# Patient Record
Sex: Female | Born: 1940 | Race: Black or African American | Hispanic: No | Marital: Married | Smoking: Former smoker
Health system: Southern US, Community
[De-identification: ages and names within clinical notes are randomized; demographics above are authoritative.]

## PROBLEM LIST (undated history)

## (undated) DIAGNOSIS — I2510 Atherosclerotic heart disease of native coronary artery without angina pectoris: Secondary | ICD-10-CM

## (undated) DIAGNOSIS — Z952 Presence of prosthetic heart valve: Secondary | ICD-10-CM

## (undated) DIAGNOSIS — K219 Gastro-esophageal reflux disease without esophagitis: Secondary | ICD-10-CM

## (undated) DIAGNOSIS — E785 Hyperlipidemia, unspecified: Secondary | ICD-10-CM

## (undated) DIAGNOSIS — F329 Major depressive disorder, single episode, unspecified: Secondary | ICD-10-CM

## (undated) DIAGNOSIS — F410 Panic disorder [episodic paroxysmal anxiety] without agoraphobia: Secondary | ICD-10-CM

## (undated) DIAGNOSIS — I10 Essential (primary) hypertension: Secondary | ICD-10-CM

## (undated) DIAGNOSIS — Z951 Presence of aortocoronary bypass graft: Secondary | ICD-10-CM

## (undated) DIAGNOSIS — M316 Other giant cell arteritis: Secondary | ICD-10-CM

## (undated) DIAGNOSIS — F32A Depression, unspecified: Secondary | ICD-10-CM

## (undated) DIAGNOSIS — Z7901 Long term (current) use of anticoagulants: Secondary | ICD-10-CM

## (undated) HISTORY — PX: AORTIC VALVE REPLACEMENT: SHX41

## (undated) HISTORY — DX: Atherosclerotic heart disease of native coronary artery without angina pectoris: I25.10

## (undated) HISTORY — DX: Depression, unspecified: F32.A

## (undated) HISTORY — DX: Other giant cell arteritis: M31.6

## (undated) HISTORY — PX: CARDIAC SURGERY: SHX584

## (undated) HISTORY — DX: Essential (primary) hypertension: I10

## (undated) HISTORY — DX: Presence of prosthetic heart valve: Z95.2

## (undated) HISTORY — DX: Panic disorder (episodic paroxysmal anxiety): F41.0

## (undated) HISTORY — DX: Gastro-esophageal reflux disease without esophagitis: K21.9

## (undated) HISTORY — DX: Presence of aortocoronary bypass graft: Z95.1

## (undated) HISTORY — PX: CHOLECYSTECTOMY: SHX55

## (undated) HISTORY — DX: Major depressive disorder, single episode, unspecified: F32.9

## (undated) HISTORY — DX: Hyperlipidemia, unspecified: E78.5

## (undated) HISTORY — DX: Long term (current) use of anticoagulants: Z79.01

## (undated) HISTORY — PX: CORONARY ARTERY BYPASS GRAFT: SHX141

---

## 1989-03-18 DIAGNOSIS — Z951 Presence of aortocoronary bypass graft: Secondary | ICD-10-CM

## 1989-03-18 HISTORY — DX: Presence of aortocoronary bypass graft: Z95.1

## 1997-06-16 ENCOUNTER — Encounter (HOSPITAL_COMMUNITY): Admission: RE | Admit: 1997-06-16 | Discharge: 1997-09-14 | Payer: Self-pay | Admitting: Cardiology

## 1997-08-12 ENCOUNTER — Ambulatory Visit (HOSPITAL_COMMUNITY): Admission: RE | Admit: 1997-08-12 | Discharge: 1997-08-12 | Payer: Self-pay | Admitting: Family Medicine

## 1998-04-10 ENCOUNTER — Encounter: Payer: Self-pay | Admitting: Cardiology

## 1998-04-10 ENCOUNTER — Ambulatory Visit (HOSPITAL_COMMUNITY): Admission: RE | Admit: 1998-04-10 | Discharge: 1998-04-10 | Payer: Self-pay | Admitting: Cardiology

## 1998-08-04 ENCOUNTER — Other Ambulatory Visit: Admission: RE | Admit: 1998-08-04 | Discharge: 1998-08-04 | Payer: Self-pay | Admitting: Obstetrics and Gynecology

## 1998-11-03 ENCOUNTER — Encounter: Payer: Self-pay | Admitting: General Surgery

## 1998-11-07 ENCOUNTER — Ambulatory Visit (HOSPITAL_COMMUNITY): Admission: RE | Admit: 1998-11-07 | Discharge: 1998-11-08 | Payer: Self-pay | Admitting: General Surgery

## 1999-11-08 ENCOUNTER — Other Ambulatory Visit: Admission: RE | Admit: 1999-11-08 | Discharge: 1999-11-08 | Payer: Self-pay | Admitting: Family Medicine

## 2000-05-10 ENCOUNTER — Emergency Department (HOSPITAL_COMMUNITY): Admission: EM | Admit: 2000-05-10 | Discharge: 2000-05-10 | Payer: Self-pay | Admitting: *Deleted

## 2000-05-10 ENCOUNTER — Encounter: Payer: Self-pay | Admitting: Emergency Medicine

## 2000-05-31 ENCOUNTER — Emergency Department (HOSPITAL_COMMUNITY): Admission: EM | Admit: 2000-05-31 | Discharge: 2000-05-31 | Payer: Self-pay | Admitting: Emergency Medicine

## 2000-05-31 ENCOUNTER — Encounter: Payer: Self-pay | Admitting: Emergency Medicine

## 2000-08-19 ENCOUNTER — Emergency Department (HOSPITAL_COMMUNITY): Admission: EM | Admit: 2000-08-19 | Discharge: 2000-08-19 | Payer: Self-pay | Admitting: *Deleted

## 2000-08-20 ENCOUNTER — Encounter: Payer: Self-pay | Admitting: *Deleted

## 2000-10-10 ENCOUNTER — Encounter: Payer: Self-pay | Admitting: Cardiology

## 2000-10-10 ENCOUNTER — Inpatient Hospital Stay (HOSPITAL_COMMUNITY): Admission: AD | Admit: 2000-10-10 | Discharge: 2000-10-18 | Payer: Self-pay | Admitting: Cardiology

## 2000-10-14 ENCOUNTER — Encounter: Payer: Self-pay | Admitting: Cardiology

## 2000-11-10 ENCOUNTER — Other Ambulatory Visit: Admission: RE | Admit: 2000-11-10 | Discharge: 2000-11-10 | Payer: Self-pay | Admitting: Family Medicine

## 2000-11-14 ENCOUNTER — Ambulatory Visit (HOSPITAL_COMMUNITY): Admission: RE | Admit: 2000-11-14 | Discharge: 2000-11-14 | Payer: Self-pay | Admitting: Gastroenterology

## 2001-02-02 ENCOUNTER — Encounter (HOSPITAL_COMMUNITY): Admission: RE | Admit: 2001-02-02 | Discharge: 2001-05-03 | Payer: Self-pay | Admitting: Cardiology

## 2001-05-16 ENCOUNTER — Encounter (HOSPITAL_COMMUNITY): Admission: RE | Admit: 2001-05-16 | Discharge: 2001-08-14 | Payer: Self-pay | Admitting: Cardiology

## 2001-08-16 ENCOUNTER — Encounter (HOSPITAL_COMMUNITY): Admission: RE | Admit: 2001-08-16 | Discharge: 2001-11-14 | Payer: Self-pay | Admitting: Cardiology

## 2001-11-12 ENCOUNTER — Other Ambulatory Visit: Admission: RE | Admit: 2001-11-12 | Discharge: 2001-11-12 | Payer: Self-pay | Admitting: Family Medicine

## 2001-11-16 ENCOUNTER — Encounter (HOSPITAL_COMMUNITY): Admission: RE | Admit: 2001-11-16 | Discharge: 2002-02-14 | Payer: Self-pay | Admitting: Cardiology

## 2002-02-15 ENCOUNTER — Encounter (HOSPITAL_COMMUNITY): Admission: RE | Admit: 2002-02-15 | Discharge: 2002-05-16 | Payer: Self-pay | Admitting: Cardiology

## 2002-05-05 ENCOUNTER — Ambulatory Visit (HOSPITAL_BASED_OUTPATIENT_CLINIC_OR_DEPARTMENT_OTHER): Admission: RE | Admit: 2002-05-05 | Discharge: 2002-05-05 | Payer: Self-pay | Admitting: Family Medicine

## 2002-05-17 ENCOUNTER — Encounter (HOSPITAL_COMMUNITY): Admission: RE | Admit: 2002-05-17 | Discharge: 2002-08-15 | Payer: Self-pay | Admitting: Cardiology

## 2002-08-17 ENCOUNTER — Encounter (HOSPITAL_COMMUNITY): Admission: RE | Admit: 2002-08-17 | Discharge: 2002-11-15 | Payer: Self-pay | Admitting: Cardiology

## 2002-11-17 ENCOUNTER — Encounter (HOSPITAL_COMMUNITY): Admission: RE | Admit: 2002-11-17 | Discharge: 2003-02-15 | Payer: Self-pay | Admitting: Cardiology

## 2003-02-16 ENCOUNTER — Encounter (HOSPITAL_COMMUNITY): Admission: RE | Admit: 2003-02-16 | Discharge: 2003-05-17 | Payer: Self-pay | Admitting: Cardiology

## 2003-02-23 ENCOUNTER — Other Ambulatory Visit: Admission: RE | Admit: 2003-02-23 | Discharge: 2003-02-23 | Payer: Self-pay | Admitting: Family Medicine

## 2004-05-16 ENCOUNTER — Other Ambulatory Visit: Admission: RE | Admit: 2004-05-16 | Discharge: 2004-05-16 | Payer: Self-pay | Admitting: Family Medicine

## 2005-05-21 ENCOUNTER — Other Ambulatory Visit: Admission: RE | Admit: 2005-05-21 | Discharge: 2005-05-21 | Payer: Self-pay | Admitting: Family Medicine

## 2005-06-25 ENCOUNTER — Encounter (HOSPITAL_COMMUNITY): Admission: RE | Admit: 2005-06-25 | Discharge: 2005-09-23 | Payer: Self-pay | Admitting: Cardiology

## 2005-10-17 ENCOUNTER — Encounter (HOSPITAL_COMMUNITY): Admission: RE | Admit: 2005-10-17 | Discharge: 2006-01-15 | Payer: Self-pay | Admitting: Cardiology

## 2006-01-16 ENCOUNTER — Encounter (HOSPITAL_COMMUNITY): Admission: RE | Admit: 2006-01-16 | Discharge: 2006-04-16 | Payer: Self-pay | Admitting: Cardiology

## 2006-04-18 ENCOUNTER — Encounter (HOSPITAL_COMMUNITY): Admission: RE | Admit: 2006-04-18 | Discharge: 2006-07-17 | Payer: Self-pay | Admitting: Cardiology

## 2006-05-15 ENCOUNTER — Emergency Department (HOSPITAL_COMMUNITY): Admission: EM | Admit: 2006-05-15 | Discharge: 2006-05-15 | Payer: Self-pay | Admitting: Emergency Medicine

## 2006-07-18 ENCOUNTER — Encounter (HOSPITAL_COMMUNITY): Admission: RE | Admit: 2006-07-18 | Discharge: 2006-10-16 | Payer: Self-pay | Admitting: Cardiology

## 2006-08-05 ENCOUNTER — Other Ambulatory Visit: Admission: RE | Admit: 2006-08-05 | Discharge: 2006-08-05 | Payer: Self-pay | Admitting: Family Medicine

## 2006-10-17 ENCOUNTER — Encounter (HOSPITAL_COMMUNITY): Admission: RE | Admit: 2006-10-17 | Discharge: 2007-01-15 | Payer: Self-pay | Admitting: Cardiology

## 2007-01-17 ENCOUNTER — Encounter (HOSPITAL_COMMUNITY): Admission: RE | Admit: 2007-01-17 | Discharge: 2007-03-17 | Payer: Self-pay | Admitting: Cardiology

## 2007-03-19 ENCOUNTER — Encounter (HOSPITAL_COMMUNITY): Admission: RE | Admit: 2007-03-19 | Discharge: 2007-04-17 | Payer: Self-pay | Admitting: Cardiology

## 2007-04-19 ENCOUNTER — Encounter (HOSPITAL_COMMUNITY): Admission: RE | Admit: 2007-04-19 | Discharge: 2007-07-18 | Payer: Self-pay | Admitting: Cardiology

## 2007-07-19 ENCOUNTER — Encounter (HOSPITAL_COMMUNITY): Admission: RE | Admit: 2007-07-19 | Discharge: 2007-10-17 | Payer: Self-pay | Admitting: Cardiology

## 2007-08-25 ENCOUNTER — Other Ambulatory Visit: Admission: RE | Admit: 2007-08-25 | Discharge: 2007-08-25 | Payer: Self-pay | Admitting: Family Medicine

## 2007-10-18 ENCOUNTER — Encounter (HOSPITAL_COMMUNITY): Admission: RE | Admit: 2007-10-18 | Discharge: 2007-12-10 | Payer: Self-pay | Admitting: Cardiology

## 2008-11-01 ENCOUNTER — Encounter (HOSPITAL_COMMUNITY): Admission: RE | Admit: 2008-11-01 | Discharge: 2009-01-30 | Payer: Self-pay | Admitting: Cardiology

## 2008-11-09 ENCOUNTER — Other Ambulatory Visit: Admission: RE | Admit: 2008-11-09 | Discharge: 2008-11-09 | Payer: Self-pay | Admitting: Family Medicine

## 2009-02-15 ENCOUNTER — Encounter (HOSPITAL_COMMUNITY): Admission: RE | Admit: 2009-02-15 | Discharge: 2009-05-16 | Payer: Self-pay | Admitting: Cardiology

## 2009-05-17 ENCOUNTER — Encounter (HOSPITAL_COMMUNITY): Admission: RE | Admit: 2009-05-17 | Discharge: 2009-08-15 | Payer: Self-pay | Admitting: Cardiology

## 2009-08-16 ENCOUNTER — Encounter (HOSPITAL_COMMUNITY): Admission: RE | Admit: 2009-08-16 | Discharge: 2009-11-14 | Payer: Self-pay | Admitting: Cardiology

## 2009-10-16 ENCOUNTER — Ambulatory Visit: Payer: Self-pay | Admitting: Cardiology

## 2009-11-16 ENCOUNTER — Ambulatory Visit: Payer: Self-pay | Admitting: Cardiology

## 2009-11-16 ENCOUNTER — Encounter (HOSPITAL_COMMUNITY)
Admission: RE | Admit: 2009-11-16 | Discharge: 2010-02-14 | Payer: Self-pay | Source: Home / Self Care | Admitting: Cardiology

## 2009-11-30 ENCOUNTER — Ambulatory Visit: Payer: Self-pay | Admitting: Cardiology

## 2009-12-28 ENCOUNTER — Ambulatory Visit: Payer: Self-pay | Admitting: Cardiology

## 2010-01-29 ENCOUNTER — Ambulatory Visit: Payer: Self-pay | Admitting: Cardiology

## 2010-02-12 ENCOUNTER — Ambulatory Visit: Payer: Self-pay | Admitting: Cardiology

## 2010-02-15 ENCOUNTER — Encounter (HOSPITAL_COMMUNITY)
Admission: RE | Admit: 2010-02-15 | Discharge: 2010-04-17 | Payer: Self-pay | Source: Home / Self Care | Attending: Cardiology | Admitting: Cardiology

## 2010-02-20 ENCOUNTER — Ambulatory Visit: Payer: Self-pay | Admitting: Cardiovascular Disease

## 2010-03-16 ENCOUNTER — Ambulatory Visit: Payer: Self-pay | Admitting: Cardiology

## 2010-04-17 ENCOUNTER — Ambulatory Visit: Payer: Self-pay | Admitting: Cardiology

## 2010-04-18 ENCOUNTER — Ambulatory Visit (HOSPITAL_COMMUNITY): Payer: Medicare Other

## 2010-04-18 DIAGNOSIS — I10 Essential (primary) hypertension: Secondary | ICD-10-CM | POA: Insufficient documentation

## 2010-04-18 DIAGNOSIS — I2510 Atherosclerotic heart disease of native coronary artery without angina pectoris: Secondary | ICD-10-CM | POA: Insufficient documentation

## 2010-04-18 DIAGNOSIS — Z5189 Encounter for other specified aftercare: Secondary | ICD-10-CM | POA: Insufficient documentation

## 2010-04-18 DIAGNOSIS — I359 Nonrheumatic aortic valve disorder, unspecified: Secondary | ICD-10-CM | POA: Insufficient documentation

## 2010-04-18 DIAGNOSIS — Z951 Presence of aortocoronary bypass graft: Secondary | ICD-10-CM | POA: Insufficient documentation

## 2010-04-19 ENCOUNTER — Ambulatory Visit (INDEPENDENT_AMBULATORY_CARE_PROVIDER_SITE_OTHER): Payer: Medicare Other | Admitting: Cardiology

## 2010-04-19 ENCOUNTER — Encounter (HOSPITAL_COMMUNITY): Payer: Self-pay

## 2010-04-19 DIAGNOSIS — Z7901 Long term (current) use of anticoagulants: Secondary | ICD-10-CM

## 2010-04-19 DIAGNOSIS — I2510 Atherosclerotic heart disease of native coronary artery without angina pectoris: Secondary | ICD-10-CM

## 2010-04-19 DIAGNOSIS — IMO0001 Reserved for inherently not codable concepts without codable children: Secondary | ICD-10-CM

## 2010-04-24 ENCOUNTER — Encounter: Payer: Self-pay | Admitting: *Deleted

## 2010-04-24 ENCOUNTER — Encounter (HOSPITAL_COMMUNITY): Payer: Self-pay

## 2010-04-24 ENCOUNTER — Ambulatory Visit (INDEPENDENT_AMBULATORY_CARE_PROVIDER_SITE_OTHER): Payer: Medicare Other | Admitting: *Deleted

## 2010-04-24 DIAGNOSIS — I10 Essential (primary) hypertension: Secondary | ICD-10-CM

## 2010-04-24 DIAGNOSIS — E785 Hyperlipidemia, unspecified: Secondary | ICD-10-CM

## 2010-04-24 DIAGNOSIS — F410 Panic disorder [episodic paroxysmal anxiety] without agoraphobia: Secondary | ICD-10-CM | POA: Insufficient documentation

## 2010-04-24 DIAGNOSIS — Z952 Presence of prosthetic heart valve: Secondary | ICD-10-CM

## 2010-04-24 DIAGNOSIS — Z951 Presence of aortocoronary bypass graft: Secondary | ICD-10-CM | POA: Insufficient documentation

## 2010-04-24 DIAGNOSIS — M316 Other giant cell arteritis: Secondary | ICD-10-CM | POA: Insufficient documentation

## 2010-04-24 DIAGNOSIS — R002 Palpitations: Secondary | ICD-10-CM

## 2010-04-24 DIAGNOSIS — Z7901 Long term (current) use of anticoagulants: Secondary | ICD-10-CM

## 2010-04-24 DIAGNOSIS — Z954 Presence of other heart-valve replacement: Secondary | ICD-10-CM

## 2010-04-24 NOTE — Patient Instructions (Signed)
We will check a 24 hour Holter and update her echo. Further disposition to follow. She will tentatively see Dr. [place] back at her scheduled time in 6 months, but sooner if the studies would dictate otherwise. She is agreeable to this plan and will call for any problems in the interim.

## 2010-04-24 NOTE — Assessment & Plan Note (Signed)
Blood pressure is satisfactory as an outpatient. We will continue to monitor.

## 2010-04-24 NOTE — Progress Notes (Signed)
Tamara Patterson is seen today for a work in visit. She was just here 5 days ago and had a good visit. This past Friday night she was out at dinner. She had 2 cups of coffee. She felt like it was caffeinated. She began having palpitations that have continued off and on since that time. She is not really dizzy but describes it as a nuisance. She feels fluttering in her chest with some skipping. The palpitations will make her cough at times.   She was previously doing well. She denies chest pain. Her blood pressure is checked regularly at rehab and is normal.   The 15 point review of systems is positive for palpitations, anxiety, back pain, joint pain and myalgias. She has seen her PCP last week and has had extensive blood work for evaluation of her myalgias.   I have reviewed the patient's medical history in detail and updated the computerized patient record.  Exam:  She is very pleasant and in no acute distress. She looks much younger than her stated age. Vital signs are noted. Skin is warm and dry. Color is normal. HEENT is unremarkable. Lungs are clear. Cardiac exam shows a regular rate and rhythm. Valve sound is crisp. She does have a rare ectopic. Abdomen is soft. Extremities are without edema. Gait and ROM are intact. She has no gross neurologic deficits.   EKG shows sinus rhythm with diffuse ST and T wave changes.

## 2010-04-25 ENCOUNTER — Encounter (HOSPITAL_COMMUNITY): Payer: Self-pay

## 2010-04-26 ENCOUNTER — Encounter (HOSPITAL_COMMUNITY): Payer: Self-pay

## 2010-05-01 ENCOUNTER — Encounter (INDEPENDENT_AMBULATORY_CARE_PROVIDER_SITE_OTHER): Payer: Medicare Other

## 2010-05-01 ENCOUNTER — Encounter (HOSPITAL_COMMUNITY): Payer: Self-pay

## 2010-05-01 DIAGNOSIS — I2510 Atherosclerotic heart disease of native coronary artery without angina pectoris: Secondary | ICD-10-CM

## 2010-05-01 DIAGNOSIS — Z7901 Long term (current) use of anticoagulants: Secondary | ICD-10-CM

## 2010-05-02 ENCOUNTER — Encounter (HOSPITAL_COMMUNITY): Payer: Self-pay

## 2010-05-03 ENCOUNTER — Encounter (HOSPITAL_COMMUNITY): Payer: Self-pay

## 2010-05-04 ENCOUNTER — Ambulatory Visit (HOSPITAL_COMMUNITY): Payer: Medicare Other | Attending: Cardiology

## 2010-05-04 ENCOUNTER — Encounter: Payer: Self-pay | Admitting: Cardiology

## 2010-05-04 DIAGNOSIS — I359 Nonrheumatic aortic valve disorder, unspecified: Secondary | ICD-10-CM | POA: Insufficient documentation

## 2010-05-04 DIAGNOSIS — E785 Hyperlipidemia, unspecified: Secondary | ICD-10-CM | POA: Insufficient documentation

## 2010-05-04 DIAGNOSIS — I059 Rheumatic mitral valve disease, unspecified: Secondary | ICD-10-CM | POA: Insufficient documentation

## 2010-05-04 DIAGNOSIS — R002 Palpitations: Secondary | ICD-10-CM

## 2010-05-04 DIAGNOSIS — I10 Essential (primary) hypertension: Secondary | ICD-10-CM | POA: Insufficient documentation

## 2010-05-04 DIAGNOSIS — I079 Rheumatic tricuspid valve disease, unspecified: Secondary | ICD-10-CM | POA: Insufficient documentation

## 2010-05-04 DIAGNOSIS — I379 Nonrheumatic pulmonary valve disorder, unspecified: Secondary | ICD-10-CM | POA: Insufficient documentation

## 2010-05-04 DIAGNOSIS — Z954 Presence of other heart-valve replacement: Secondary | ICD-10-CM | POA: Insufficient documentation

## 2010-05-08 ENCOUNTER — Encounter (HOSPITAL_COMMUNITY): Payer: Self-pay

## 2010-05-09 ENCOUNTER — Encounter (HOSPITAL_COMMUNITY): Payer: Self-pay

## 2010-05-10 ENCOUNTER — Encounter (HOSPITAL_COMMUNITY): Payer: Self-pay

## 2010-05-15 ENCOUNTER — Encounter (HOSPITAL_COMMUNITY): Payer: Self-pay

## 2010-05-16 ENCOUNTER — Encounter (HOSPITAL_COMMUNITY): Payer: Self-pay

## 2010-05-17 ENCOUNTER — Encounter (HOSPITAL_COMMUNITY): Payer: Self-pay

## 2010-05-22 ENCOUNTER — Encounter (HOSPITAL_COMMUNITY): Payer: Self-pay

## 2010-05-23 ENCOUNTER — Encounter (HOSPITAL_COMMUNITY): Payer: Self-pay

## 2010-05-24 ENCOUNTER — Encounter: Payer: Self-pay | Admitting: Cardiology

## 2010-05-24 ENCOUNTER — Encounter (HOSPITAL_COMMUNITY): Payer: Self-pay

## 2010-05-29 ENCOUNTER — Encounter (HOSPITAL_COMMUNITY): Payer: Self-pay | Attending: Cardiology

## 2010-05-29 ENCOUNTER — Encounter (INDEPENDENT_AMBULATORY_CARE_PROVIDER_SITE_OTHER): Payer: Medicare Other

## 2010-05-29 DIAGNOSIS — Z954 Presence of other heart-valve replacement: Secondary | ICD-10-CM

## 2010-05-29 DIAGNOSIS — Z7901 Long term (current) use of anticoagulants: Secondary | ICD-10-CM

## 2010-05-29 DIAGNOSIS — I2510 Atherosclerotic heart disease of native coronary artery without angina pectoris: Secondary | ICD-10-CM

## 2010-05-30 ENCOUNTER — Encounter (HOSPITAL_COMMUNITY): Payer: Self-pay

## 2010-05-30 DIAGNOSIS — I2510 Atherosclerotic heart disease of native coronary artery without angina pectoris: Secondary | ICD-10-CM | POA: Insufficient documentation

## 2010-05-30 DIAGNOSIS — Z5189 Encounter for other specified aftercare: Secondary | ICD-10-CM | POA: Insufficient documentation

## 2010-05-30 DIAGNOSIS — I359 Nonrheumatic aortic valve disorder, unspecified: Secondary | ICD-10-CM | POA: Insufficient documentation

## 2010-05-30 DIAGNOSIS — Z951 Presence of aortocoronary bypass graft: Secondary | ICD-10-CM | POA: Insufficient documentation

## 2010-05-30 DIAGNOSIS — I10 Essential (primary) hypertension: Secondary | ICD-10-CM | POA: Insufficient documentation

## 2010-05-31 ENCOUNTER — Encounter (HOSPITAL_COMMUNITY): Payer: Self-pay

## 2010-06-05 ENCOUNTER — Encounter (HOSPITAL_COMMUNITY): Payer: Self-pay

## 2010-06-06 ENCOUNTER — Encounter (HOSPITAL_COMMUNITY): Payer: Self-pay

## 2010-06-07 ENCOUNTER — Encounter (HOSPITAL_COMMUNITY): Payer: Self-pay

## 2010-06-12 ENCOUNTER — Encounter (HOSPITAL_COMMUNITY): Payer: Self-pay

## 2010-06-13 ENCOUNTER — Encounter (HOSPITAL_COMMUNITY): Payer: Self-pay

## 2010-06-14 ENCOUNTER — Encounter (HOSPITAL_COMMUNITY): Payer: Self-pay

## 2010-06-19 ENCOUNTER — Encounter (HOSPITAL_COMMUNITY): Payer: Self-pay | Attending: Cardiology

## 2010-06-19 DIAGNOSIS — I359 Nonrheumatic aortic valve disorder, unspecified: Secondary | ICD-10-CM | POA: Insufficient documentation

## 2010-06-19 DIAGNOSIS — Z5189 Encounter for other specified aftercare: Secondary | ICD-10-CM | POA: Insufficient documentation

## 2010-06-19 DIAGNOSIS — Z951 Presence of aortocoronary bypass graft: Secondary | ICD-10-CM | POA: Insufficient documentation

## 2010-06-19 DIAGNOSIS — I2510 Atherosclerotic heart disease of native coronary artery without angina pectoris: Secondary | ICD-10-CM | POA: Insufficient documentation

## 2010-06-19 DIAGNOSIS — I10 Essential (primary) hypertension: Secondary | ICD-10-CM | POA: Insufficient documentation

## 2010-06-20 ENCOUNTER — Encounter (HOSPITAL_COMMUNITY): Payer: Self-pay

## 2010-06-21 ENCOUNTER — Encounter (HOSPITAL_COMMUNITY): Payer: Self-pay

## 2010-06-26 ENCOUNTER — Encounter (HOSPITAL_COMMUNITY): Payer: Self-pay

## 2010-06-26 ENCOUNTER — Ambulatory Visit (INDEPENDENT_AMBULATORY_CARE_PROVIDER_SITE_OTHER): Payer: Medicare Other | Admitting: *Deleted

## 2010-06-26 DIAGNOSIS — Z954 Presence of other heart-valve replacement: Secondary | ICD-10-CM

## 2010-06-26 DIAGNOSIS — I359 Nonrheumatic aortic valve disorder, unspecified: Secondary | ICD-10-CM

## 2010-06-26 DIAGNOSIS — Z952 Presence of prosthetic heart valve: Secondary | ICD-10-CM

## 2010-06-26 DIAGNOSIS — Z7901 Long term (current) use of anticoagulants: Secondary | ICD-10-CM

## 2010-06-26 LAB — POCT INR: INR: 3.6

## 2010-06-27 ENCOUNTER — Encounter (HOSPITAL_COMMUNITY): Payer: Self-pay

## 2010-06-28 ENCOUNTER — Encounter (HOSPITAL_COMMUNITY): Payer: Self-pay

## 2010-07-03 ENCOUNTER — Encounter (HOSPITAL_COMMUNITY): Payer: Self-pay

## 2010-07-04 ENCOUNTER — Encounter (HOSPITAL_COMMUNITY): Payer: Self-pay

## 2010-07-05 ENCOUNTER — Encounter (HOSPITAL_COMMUNITY): Payer: Self-pay

## 2010-07-10 ENCOUNTER — Encounter (HOSPITAL_COMMUNITY): Payer: Self-pay

## 2010-07-11 ENCOUNTER — Encounter (HOSPITAL_COMMUNITY): Payer: Self-pay

## 2010-07-12 ENCOUNTER — Encounter (HOSPITAL_COMMUNITY): Payer: Self-pay

## 2010-07-17 ENCOUNTER — Ambulatory Visit (INDEPENDENT_AMBULATORY_CARE_PROVIDER_SITE_OTHER): Payer: Medicare Other | Admitting: *Deleted

## 2010-07-17 ENCOUNTER — Encounter (HOSPITAL_COMMUNITY): Payer: Self-pay | Attending: Cardiology

## 2010-07-17 DIAGNOSIS — I10 Essential (primary) hypertension: Secondary | ICD-10-CM | POA: Insufficient documentation

## 2010-07-17 DIAGNOSIS — I2510 Atherosclerotic heart disease of native coronary artery without angina pectoris: Secondary | ICD-10-CM | POA: Insufficient documentation

## 2010-07-17 DIAGNOSIS — Z951 Presence of aortocoronary bypass graft: Secondary | ICD-10-CM | POA: Insufficient documentation

## 2010-07-17 DIAGNOSIS — Z7901 Long term (current) use of anticoagulants: Secondary | ICD-10-CM

## 2010-07-17 DIAGNOSIS — Z952 Presence of prosthetic heart valve: Secondary | ICD-10-CM

## 2010-07-17 DIAGNOSIS — I359 Nonrheumatic aortic valve disorder, unspecified: Secondary | ICD-10-CM | POA: Insufficient documentation

## 2010-07-17 DIAGNOSIS — Z5189 Encounter for other specified aftercare: Secondary | ICD-10-CM | POA: Insufficient documentation

## 2010-07-17 DIAGNOSIS — Z954 Presence of other heart-valve replacement: Secondary | ICD-10-CM

## 2010-07-18 ENCOUNTER — Encounter (HOSPITAL_COMMUNITY): Payer: Self-pay

## 2010-07-19 ENCOUNTER — Encounter (HOSPITAL_COMMUNITY): Payer: Self-pay

## 2010-07-24 ENCOUNTER — Encounter (HOSPITAL_COMMUNITY): Payer: Self-pay

## 2010-07-24 ENCOUNTER — Encounter: Payer: Medicare Other | Admitting: *Deleted

## 2010-07-25 ENCOUNTER — Encounter (HOSPITAL_COMMUNITY): Payer: Self-pay

## 2010-07-26 ENCOUNTER — Encounter (HOSPITAL_COMMUNITY): Payer: Self-pay

## 2010-07-31 ENCOUNTER — Encounter (HOSPITAL_COMMUNITY): Payer: Self-pay

## 2010-08-01 ENCOUNTER — Encounter (HOSPITAL_COMMUNITY): Payer: Self-pay

## 2010-08-01 ENCOUNTER — Ambulatory Visit (INDEPENDENT_AMBULATORY_CARE_PROVIDER_SITE_OTHER): Payer: Medicare Other | Admitting: *Deleted

## 2010-08-01 DIAGNOSIS — E7800 Pure hypercholesterolemia, unspecified: Secondary | ICD-10-CM

## 2010-08-01 DIAGNOSIS — Z7901 Long term (current) use of anticoagulants: Secondary | ICD-10-CM

## 2010-08-01 DIAGNOSIS — Z952 Presence of prosthetic heart valve: Secondary | ICD-10-CM

## 2010-08-01 DIAGNOSIS — I359 Nonrheumatic aortic valve disorder, unspecified: Secondary | ICD-10-CM

## 2010-08-01 DIAGNOSIS — Z954 Presence of other heart-valve replacement: Secondary | ICD-10-CM

## 2010-08-01 LAB — LIPID PANEL
Cholesterol: 166 mg/dL (ref 0–200)
HDL: 49 mg/dL (ref >39.00)
Triglycerides: 75 mg/dL (ref 0.0–149.0)

## 2010-08-01 LAB — HEPATIC FUNCTION PANEL
ALT: 21 U/L (ref 0–35)
AST: 22 U/L (ref 0–37)
Albumin: 3.7 g/dL (ref 3.5–5.2)
Alkaline Phosphatase: 73 U/L (ref 39–117)
Bilirubin, Direct: 0.1 mg/dL (ref 0.0–0.3)
Total Bilirubin: 0.7 mg/dL (ref 0.3–1.2)
Total Protein: 6.2 g/dL (ref 6.0–8.3)

## 2010-08-01 LAB — BASIC METABOLIC PANEL
CO2: 31 mEq/L (ref 19–32)
Calcium: 9 mg/dL (ref 8.4–10.5)
Chloride: 107 mEq/L (ref 96–112)
Creatinine, Ser: 0.7 mg/dL (ref 0.4–1.2)
GFR: 104.24 mL/min (ref >60.00)

## 2010-08-02 ENCOUNTER — Encounter (HOSPITAL_COMMUNITY): Payer: Self-pay

## 2010-08-02 ENCOUNTER — Telehealth: Payer: Self-pay | Admitting: *Deleted

## 2010-08-02 NOTE — Telephone Encounter (Signed)
Notified of lab results. Sent to Dr. Wynelle Link

## 2010-08-02 NOTE — Telephone Encounter (Signed)
Message copied by Murrell Redden on Thu Aug 02, 2010 10:17 AM ------      Message from: [place], PETER      Created: Wed Aug 01, 2010  4:30 PM       Chemistries all normal. Lipids look fairly good. Please report.

## 2010-08-03 NOTE — Discharge Summary (Signed)
[place]. [facility]  Patient:    Tamara Patterson, Tamara Patterson                         MRN: [id number] Adm. Date:  [id number] Disc. Date: 10/14/00 Attending:  [place], Peter Manning CC:         Elana Alm. Eliezer Lofts., M.D.   Discharge Summary  HISTORY OF PRESENT ILLNESS:  Ms. Polyakov is a 70 year old white female with history of multiple medical problems.  She presented with intermittent chest pain and abnormal ECG.  The patient has a history of coronary artery disease and is status post coronary artery bypass graft surgery x 4 in 1991.  She had to have redo bypass surgery in 1998 when she presented with severe aortic insufficiency and aortic redilatation.  She had a conduit St. Jude #23 mm valve placed at that time.  She had a reimplantation of her saphenous vein graft to the right coronary artery.  She continued to have a LIMA graft to LAD and she had a saphenous vein graft placed in the circumflex.  The patient also has had recent difficulties with panic attacks.  She has been on chronic anticoagulation with Coumadin.  She has a history of gastroesophageal reflux disease and hypertension.  For details of her past medical history, social history, family history, and physical exam, please see admission history and physical.  LABORATORY DATA:  Sodium 141, potassium 4.4, chloride 107, CO2 31, glucose 118, BUN 12, creatinine 0.8.  All other chemistries were normal.  CK was 77 with negative MB.  Troponin 0.02.  TSH 1.11.  White count 4900, hemoglobin 12, hematocrit 34.7, platelet count 224,000.  PT was 23 with an INR of 2.6.  PTT 34.  HOSPITAL COURSE:  The patient was admitted and placed on IV nitroglycerin and IV heparin.  Her Coumadin was held.  She continued to have episodes of substernal discomfort with a great deal of anxiety.  She was put on around the clock Xanax.  Serial cardiac enzymes were all normal.  ECGs continued to show persistent T-wave inversion  anterolaterally that was new.  The patient had an echocardiogram performed which showed normal left ventricular size and function.  Her aortic valve prosthesis was functioning well with very mild aortic regurgitation.  Transaortic valve gradient was 11 mmHg.  There was mild mitral regurgitation.  The aortic root appeared normal in size.  On October 14, 2000, the patient underwent cardiac catheterization.  Left ventricular angiography was not performed due to her aortic valve prosthesis. The aortic root appeared normal in size with excellent appearance of the conduit.  Her valve leaflets seemed to open well.  She did have mild aortic insufficiency.  The native coronaries were occluded at the ostium.  The saphenous vein graft to the right coronary artery arose out of the conduit and was patent with good distal runoff.  The LIMA graft to the LAD was patent but had minimal flow to the LAD.  There was a very large saphenous vein graft to the first obtuse marginal vessel.  The proximal anastomosis was off of the proximal innominate artery.  This graft supplied not only the first marginal vessel but also filled retrograde the LAD and by segment of old vein graft the second marginal vessel.  Based on these findings, it was felt that she was adequately revascularized. It was felt that her symptoms were predominantly related to her panic disorder.  The patient was continued on  her same medical therapy and was discharged home in stable condition on the same day.  DISCHARGE DIAGNOSES: 1. Atypical chest pain. 2. Panic disorder. 3. Atherosclerotic coronary artery disease, status post coronary artery bypass    graft with continued graft patency. 4. 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 5. Gastroesophageal reflux disease. 6. Hypertension. 7. History of ACE mediated cough. 8. Arteritis.  DISCHARGE MEDICATIONS: 1. Multivitamin 1 q.d. 2. Coumadin 5 mg q.d. 3. Lopressor 50 mg  b.i.d. 4. Cozaar 50 mg q.d. 5. Claritin 10 mg q.d. 6. Macrodantin 100 mg q.d. 7. Paxil 10 mg q.d. 8. Xanax 0.25 mg q.4-6h. p.r.n. anxiety.  DISCHARGE INSTRUCTIONS:  The patient is instructed no heavy lifting or straining for five days.  She will be maintained on a low salt, low fat diet. She will follow up with Dr. Peter M. [place] in two weeks with a pro time.  CONDITION ON DISCHARGE:  Improved. DD:  10/14/00 TD:  10/14/00 Job: [id number]

## 2010-08-03 NOTE — Procedures (Signed)
[place]. [facility]  Patient:    Tamara Patterson, Tamara Patterson. Visit Number: [id number] MRN: [id number]          Service Type: Attending:  Anselmo Rod, M.D. Proc. Date: 11/14/00   CC:         Gabriel Earing, M.D.   Procedure Report  DATE OF BIRTH:  1940-08-11.  REFERRING PHYSICIAN:  Gabriel Earing, M.D.  PROCEDURE PERFORMED:  Colonoscopy.  ENDOSCOPIST:  Anselmo Rod, M.D.  INSTRUMENT USED:  Olympus video colonoscope.  INDICATIONS FOR PROCEDURE:  Family history of colon cancer in a 70 year old African-American female.  This patient has CABG in the past.  PREPROCEDURE PREPARATION:  Informed consent was procured from the patient. The patient was fasted for eight hours prior to the procedure and prepped with a bottle of magnesium citrate and a gallon of NuLytely the night prior to the procedure.  PREPROCEDURE PHYSICAL:  The patient had stable vital signs.  Neck supple. Chest clear to auscultation.  S1, S2 regular.  Well-healed surgical scar from previous CABG.  Abdomen soft with normal abdominal bowel sounds.  DESCRIPTION OF PROCEDURE:  The patient was placed in the left lateral decubitus position and sedated with 50 mg of Demerol and 5 mg of Versed intravenously.  Once the patient was adequately sedated and maintained on low-flow oxygen and continuous cardiac monitoring, the Olympus video colonoscope was advanced from the rectum to the cecum without difficulty. There was a small amount of residual stool in the colon that was suctioned. Some washes were done.  The entire colonic mucosa appeared healthy without erosions, ulcerations, masses or polyps.  There was no evidence of diverticular disease or hemorrhoids.  IMPRESSION: 1. Healthy-appearing colon up to the cecum. 2. No masses or polyps seen.      RECOMMENDATIONS:  Repeat colorectal cancer screening is recommended in the next five years unless the patient were to develop any abnormal  symptoms in the interim. Attending:  Anselmo Rod, M.D. DD:  11/14/00 TD:  11/14/00 Job: [id number]

## 2010-08-03 NOTE — Cardiovascular Report (Signed)
[place]. [facility]  Patient:    Tamara Patterson, Tamara Patterson                         MRN: [id number] Proc. Date: 10/14/00 Adm. Date:  [id number] Attending:  [place], Peter Manning CC:         Lurena Joiner, M.D.   Cardiac Catheterization  INDICATIONS FOR PROCEDURE:  The patient is a 70 year old, black female, status post coronary artery bypass surgery and aortic valve replacement who presents with chest pain and anterior T wave changes on her ECG.  ACCESS:  Via the right femoral artery using the standard Seldinger technique.  EQUIPMENT:  A 6 French 4 cm right Judkins catheter, 6 French pigtail catheter, 6 French right Amplatz I catheter.  HEMODYNAMIC DATA:  Aortic pressure is 127/60 with a mean of 86 mmHg.  COMPLICATIONS:  None.  CONTRAST:  Omnipaque 320 cc.  ANGIOGRAPHIC DATA:  Left ventricular angiography was not performed due to her prosthetic aortic valve.  Aortic root angiography demonstrates normal aortic root size.  The aortic valve prosthetic leaflets have excellent mobility.  There is mild aortic insufficiency by root injection.  The saphenous vein graft to the right coronary artery is seen to arise out of the segment of the aorta that involves the conduit graft.  The LIMA graft to the LAD is patent but there was minimal flow to the LAD proper distally.  The left subclavian vessel is without significant disease.  The saphenous vein graft in the proximal right coronary artery is widely patent.  The proximal anastomosis is in the proximal aorta in the area of the aortic conduit.  It was very difficult to locate the proximal anastomosis of the saphenous vein graft to the circumflex.  After multiple investigations it was noted that this graft arises from the innominate vessel.  This was cannulated with the right coronary catheter.  This is a very large vein graft that inserts into the first obtuse marginal vessel.  It fills this vessel as well as the LAD  by retrograde flow.  The segment of the native circumflex between the first and second obtuse marginal vessel is severely diseased up to 80-90%.  However, there is still a segment of vein graft from her original bypass surgery between the first and second marginal vessels which fills the second marginal branch.  FINAL INTERPRETATION: 1. Normal aortic root size. 2. Mild aortic insufficiency. 3. Patent saphenous vein graft to the right coronary artery. 4. Patent saphenous vein graft to the first obtuse marginal branch.    This vein graft supplies most of the left coronary circulation. 5. Patent left internal mammary artery to the left anterior descending, but    with minimal distal flow.  There is competitive filling from the vein    graft to the marginal branch. DD:  10/15/00 TD:  10/15/00 Job: [id number]

## 2010-08-03 NOTE — H&P (Signed)
[place]. [facility]  Patient:    Tamara Patterson, Tamara Patterson                         MRN: [id number] Adm. Date:  [id number] Attending:  Eleanora Neighbor Dictator:   Jennet Maduro. Earl Gala, R.N., A.N.P. CC:         Peter M. [place], M.D.  Robert A. Eliezer Lofts., M.D.   History and Physical  CHIEF COMPLAINT:  Chest pain.  HISTORY OF PRESENT ILLNESS:  Mrs. Ertl is a 70 year old black female who comes into the office as a work-in appointment today on October 10, 2000.  She has been complaining of chest pain off and on since the early morning hours. She describes it as a "sinking or draining-like feeling."  It basically started after breakfast and has been off and on since that time.  She apparently has recently been diagnosed with panic attacks and did take Xanax and Paxil yesterday after an episode quite similar.  Here in the office she had 12-lead electrocardiogram performed which showed T wave inversion in leads V2 and V3 with somewhat nonspecific changes otherwise.  In light of these findings she is referred on for admission to the hospital for further evaluation.  PAST MEDICAL HISTORY:  1. Panic attacks.  2. Hypertension.  3. Gastroesophageal reflux disease.  4. ACE cough.  5. History of palpitations.  6. History of giant cell arteritis, diagnosed at time of coronary artery     bypass grafting.  7. Coronary artery bypass grafting x 4 in 1991 per Dr. Particia Lather with     left internal mammary to the LAD, saphenous vein graft to the right     coronary, saphenous vein graft to the first and second obtuse marginal.  8. History of aortic valve replacement as well as aortic root replacement     with a 23 mm St. Jude valve conduit and subsequent coronary artery bypass     grafting x 2 (vein graft to the right coronary and a vein graft to the     circumflex in December 1998).  9. Chronic Coumadin therapy. 10. History of laparoscopic cholecystectomy in August  2000.  ALLERGIES:  CODEINE.  INTOLERANCES:  ATENOLOL.  CURRENT MEDICATIONS: 1. Multivitamin daily. 2. Coumadin 5 mg a day. 3. Lopressor 50 mg b.i.d. 4. Cozaar 50 mg a day. 5. Claritin 10 mg a day. 6. Macrodantin 100 mg a day. 7. Paxil 10 mg a day. 8. Xanax p.r.n.  FAMILY HISTORY:  Both of her parents are deceased.  Father died at 25 with pancreatic cancer.  Mother died at the age of 49 with liver disease.  SOCIAL HISTORY:  She is married.  There are no children.  There are no tobacco or alcohol products used.  REVIEW OF SYSTEMS:  Is basically as stated above.  She describes this chest tightness as a draining-type feeling in the chest.  It lasts for about 30 seconds.  It is worse with anxiety and is associated with trembling.  She has had a recurrent episode around lunchtime today which was also described as a draining-type heaviness.  There was no dizziness.  She did have some nausea with that and a loose stool.  She has had previously one to two other episodes earlier this month.  Her chest pain prior to coronary artery bypass grafting was described as excruciating that occurred with exertion as well as with stress, as well as with radiation to the  left arm.  PHYSICAL EXAMINATION:  GENERAL:  She is currently in no acute distress.  VITAL SIGNS:  Blood pressure 130/66, heart rate 50, respirations 18.  SKIN:  Warm and dry.  Color is unremarkable.  LUNGS:  Clear.  HEART:  Shows a regular rhythm.  Her valve sound is crisp.  ABDOMEN:  Negative.  EXTREMITIES:  Without edema.  LABORATORY DATA:  Currently pending.  Her 12-lead electrocardiogram performed here in the office shows normal sinus rhythm.  There are diffuse T wave inversions lead V2 and V3 which are new.  OVERALL IMPRESSION: 1. Chest pain in the setting of an abnormal EKG with known history of giant    cell arteritis necessitating coronary artery bypass grafting. 2. History of aortic valve replacement as  well as aortic root replacement in    1998. 3. Chronic Coumadin therapy. 4. Panic attacks.  PLAN:  She will be admitted.  Will rule out for myocardial infarction per serial enzymes.  Will place her on low-dose nitroglycerin. DD:  10/10/00 TD:  10/11/00 Job: [id number]

## 2010-08-07 ENCOUNTER — Encounter (HOSPITAL_COMMUNITY): Payer: Self-pay

## 2010-08-08 ENCOUNTER — Encounter (HOSPITAL_COMMUNITY): Payer: Self-pay

## 2010-08-09 ENCOUNTER — Encounter (HOSPITAL_COMMUNITY): Payer: Self-pay

## 2010-08-14 ENCOUNTER — Encounter (HOSPITAL_COMMUNITY): Payer: Self-pay

## 2010-08-15 ENCOUNTER — Encounter (HOSPITAL_COMMUNITY): Payer: Self-pay

## 2010-08-15 ENCOUNTER — Ambulatory Visit (INDEPENDENT_AMBULATORY_CARE_PROVIDER_SITE_OTHER): Payer: Medicare Other | Admitting: *Deleted

## 2010-08-15 DIAGNOSIS — Z7901 Long term (current) use of anticoagulants: Secondary | ICD-10-CM

## 2010-08-15 DIAGNOSIS — Z954 Presence of other heart-valve replacement: Secondary | ICD-10-CM

## 2010-08-15 DIAGNOSIS — Z952 Presence of prosthetic heart valve: Secondary | ICD-10-CM

## 2010-08-15 DIAGNOSIS — I359 Nonrheumatic aortic valve disorder, unspecified: Secondary | ICD-10-CM

## 2010-08-16 ENCOUNTER — Encounter (HOSPITAL_COMMUNITY): Payer: Self-pay

## 2010-08-21 ENCOUNTER — Encounter (HOSPITAL_COMMUNITY): Payer: Self-pay | Attending: Cardiology

## 2010-08-21 DIAGNOSIS — I359 Nonrheumatic aortic valve disorder, unspecified: Secondary | ICD-10-CM | POA: Insufficient documentation

## 2010-08-21 DIAGNOSIS — I2510 Atherosclerotic heart disease of native coronary artery without angina pectoris: Secondary | ICD-10-CM | POA: Insufficient documentation

## 2010-08-21 DIAGNOSIS — Z951 Presence of aortocoronary bypass graft: Secondary | ICD-10-CM | POA: Insufficient documentation

## 2010-08-21 DIAGNOSIS — I10 Essential (primary) hypertension: Secondary | ICD-10-CM | POA: Insufficient documentation

## 2010-08-21 DIAGNOSIS — Z5189 Encounter for other specified aftercare: Secondary | ICD-10-CM | POA: Insufficient documentation

## 2010-08-22 ENCOUNTER — Encounter (HOSPITAL_COMMUNITY): Payer: Self-pay

## 2010-08-23 ENCOUNTER — Encounter (HOSPITAL_COMMUNITY): Payer: Self-pay

## 2010-08-28 ENCOUNTER — Encounter (HOSPITAL_COMMUNITY): Payer: Self-pay

## 2010-08-29 ENCOUNTER — Encounter (HOSPITAL_COMMUNITY): Payer: Self-pay

## 2010-08-30 ENCOUNTER — Encounter (HOSPITAL_COMMUNITY): Payer: Self-pay

## 2010-09-04 ENCOUNTER — Encounter (HOSPITAL_COMMUNITY): Payer: Self-pay

## 2010-09-05 ENCOUNTER — Encounter (HOSPITAL_COMMUNITY): Payer: Self-pay

## 2010-09-06 ENCOUNTER — Encounter (HOSPITAL_COMMUNITY): Payer: Self-pay

## 2010-09-11 ENCOUNTER — Encounter (HOSPITAL_COMMUNITY): Payer: Self-pay

## 2010-09-12 ENCOUNTER — Encounter (HOSPITAL_COMMUNITY): Payer: Self-pay

## 2010-09-12 ENCOUNTER — Ambulatory Visit (INDEPENDENT_AMBULATORY_CARE_PROVIDER_SITE_OTHER): Payer: Medicare Other | Admitting: *Deleted

## 2010-09-12 DIAGNOSIS — Z7901 Long term (current) use of anticoagulants: Secondary | ICD-10-CM

## 2010-09-12 DIAGNOSIS — Z952 Presence of prosthetic heart valve: Secondary | ICD-10-CM

## 2010-09-12 DIAGNOSIS — I359 Nonrheumatic aortic valve disorder, unspecified: Secondary | ICD-10-CM

## 2010-09-12 DIAGNOSIS — Z954 Presence of other heart-valve replacement: Secondary | ICD-10-CM

## 2010-09-12 LAB — POCT INR: INR: 1.8

## 2010-09-13 ENCOUNTER — Encounter (HOSPITAL_COMMUNITY): Payer: Self-pay

## 2010-09-18 ENCOUNTER — Encounter (HOSPITAL_COMMUNITY): Payer: Self-pay | Attending: Cardiology

## 2010-09-18 DIAGNOSIS — Z951 Presence of aortocoronary bypass graft: Secondary | ICD-10-CM | POA: Insufficient documentation

## 2010-09-18 DIAGNOSIS — I359 Nonrheumatic aortic valve disorder, unspecified: Secondary | ICD-10-CM | POA: Insufficient documentation

## 2010-09-18 DIAGNOSIS — I2510 Atherosclerotic heart disease of native coronary artery without angina pectoris: Secondary | ICD-10-CM | POA: Insufficient documentation

## 2010-09-18 DIAGNOSIS — Z5189 Encounter for other specified aftercare: Secondary | ICD-10-CM | POA: Insufficient documentation

## 2010-09-18 DIAGNOSIS — I10 Essential (primary) hypertension: Secondary | ICD-10-CM | POA: Insufficient documentation

## 2010-09-19 ENCOUNTER — Encounter (HOSPITAL_COMMUNITY): Payer: Self-pay

## 2010-09-20 ENCOUNTER — Encounter (HOSPITAL_COMMUNITY): Payer: Self-pay

## 2010-09-25 ENCOUNTER — Encounter (HOSPITAL_COMMUNITY): Payer: Self-pay

## 2010-09-26 ENCOUNTER — Encounter (HOSPITAL_COMMUNITY): Payer: Self-pay

## 2010-09-26 ENCOUNTER — Encounter: Payer: Medicare Other | Admitting: *Deleted

## 2010-09-27 ENCOUNTER — Encounter (HOSPITAL_COMMUNITY): Payer: Self-pay

## 2010-09-27 ENCOUNTER — Encounter: Payer: Medicare Other | Admitting: *Deleted

## 2010-09-27 ENCOUNTER — Ambulatory Visit (INDEPENDENT_AMBULATORY_CARE_PROVIDER_SITE_OTHER): Payer: Medicare Other | Admitting: *Deleted

## 2010-09-27 DIAGNOSIS — Z954 Presence of other heart-valve replacement: Secondary | ICD-10-CM

## 2010-09-27 DIAGNOSIS — Z7901 Long term (current) use of anticoagulants: Secondary | ICD-10-CM

## 2010-09-27 DIAGNOSIS — I359 Nonrheumatic aortic valve disorder, unspecified: Secondary | ICD-10-CM

## 2010-09-27 DIAGNOSIS — Z952 Presence of prosthetic heart valve: Secondary | ICD-10-CM

## 2010-09-27 LAB — POCT INR: INR: 3.9

## 2010-10-02 ENCOUNTER — Encounter (HOSPITAL_COMMUNITY): Payer: Self-pay

## 2010-10-03 ENCOUNTER — Encounter (HOSPITAL_COMMUNITY): Payer: Self-pay

## 2010-10-04 ENCOUNTER — Encounter (HOSPITAL_COMMUNITY): Payer: Self-pay

## 2010-10-09 ENCOUNTER — Encounter (HOSPITAL_COMMUNITY): Payer: Self-pay

## 2010-10-10 ENCOUNTER — Encounter (HOSPITAL_COMMUNITY): Payer: Self-pay

## 2010-10-11 ENCOUNTER — Encounter (HOSPITAL_COMMUNITY): Payer: Self-pay

## 2010-10-12 ENCOUNTER — Ambulatory Visit (INDEPENDENT_AMBULATORY_CARE_PROVIDER_SITE_OTHER): Payer: Medicare Other | Admitting: *Deleted

## 2010-10-12 DIAGNOSIS — I359 Nonrheumatic aortic valve disorder, unspecified: Secondary | ICD-10-CM

## 2010-10-12 DIAGNOSIS — Z7901 Long term (current) use of anticoagulants: Secondary | ICD-10-CM

## 2010-10-12 DIAGNOSIS — Z952 Presence of prosthetic heart valve: Secondary | ICD-10-CM

## 2010-10-12 LAB — POCT INR: INR: 2.9

## 2010-10-16 ENCOUNTER — Encounter (HOSPITAL_COMMUNITY): Payer: Self-pay

## 2010-10-17 ENCOUNTER — Encounter (HOSPITAL_COMMUNITY): Payer: Self-pay | Attending: Cardiology

## 2010-10-17 DIAGNOSIS — I10 Essential (primary) hypertension: Secondary | ICD-10-CM | POA: Insufficient documentation

## 2010-10-17 DIAGNOSIS — I2510 Atherosclerotic heart disease of native coronary artery without angina pectoris: Secondary | ICD-10-CM | POA: Insufficient documentation

## 2010-10-17 DIAGNOSIS — Z5189 Encounter for other specified aftercare: Secondary | ICD-10-CM | POA: Insufficient documentation

## 2010-10-17 DIAGNOSIS — I359 Nonrheumatic aortic valve disorder, unspecified: Secondary | ICD-10-CM | POA: Insufficient documentation

## 2010-10-17 DIAGNOSIS — Z951 Presence of aortocoronary bypass graft: Secondary | ICD-10-CM | POA: Insufficient documentation

## 2010-10-18 ENCOUNTER — Encounter (HOSPITAL_COMMUNITY): Payer: Self-pay

## 2010-10-23 ENCOUNTER — Encounter (HOSPITAL_COMMUNITY): Payer: Self-pay

## 2010-10-24 ENCOUNTER — Encounter (HOSPITAL_COMMUNITY): Payer: Self-pay

## 2010-10-24 ENCOUNTER — Other Ambulatory Visit: Payer: Self-pay | Admitting: Cardiology

## 2010-10-24 ENCOUNTER — Ambulatory Visit: Payer: Medicare Other | Admitting: Cardiology

## 2010-10-24 ENCOUNTER — Encounter: Payer: Self-pay | Admitting: Cardiology

## 2010-10-24 NOTE — Telephone Encounter (Signed)
escribe medication per fax request  

## 2010-10-25 ENCOUNTER — Encounter (HOSPITAL_COMMUNITY): Payer: Self-pay

## 2010-10-29 ENCOUNTER — Ambulatory Visit (INDEPENDENT_AMBULATORY_CARE_PROVIDER_SITE_OTHER): Payer: Medicare Other | Admitting: *Deleted

## 2010-10-29 DIAGNOSIS — Z7901 Long term (current) use of anticoagulants: Secondary | ICD-10-CM

## 2010-10-29 DIAGNOSIS — I359 Nonrheumatic aortic valve disorder, unspecified: Secondary | ICD-10-CM

## 2010-10-29 DIAGNOSIS — Z952 Presence of prosthetic heart valve: Secondary | ICD-10-CM

## 2010-10-30 ENCOUNTER — Encounter (HOSPITAL_COMMUNITY): Payer: Self-pay

## 2010-10-31 ENCOUNTER — Encounter (HOSPITAL_COMMUNITY): Payer: Self-pay

## 2010-11-01 ENCOUNTER — Encounter (HOSPITAL_COMMUNITY): Payer: Self-pay

## 2010-11-06 ENCOUNTER — Encounter (HOSPITAL_COMMUNITY): Payer: Self-pay

## 2010-11-07 ENCOUNTER — Encounter (HOSPITAL_COMMUNITY): Payer: Self-pay

## 2010-11-08 ENCOUNTER — Encounter (HOSPITAL_COMMUNITY): Payer: Self-pay

## 2010-11-13 ENCOUNTER — Encounter (HOSPITAL_COMMUNITY): Payer: Self-pay

## 2010-11-14 ENCOUNTER — Encounter (HOSPITAL_COMMUNITY): Payer: Self-pay

## 2010-11-15 ENCOUNTER — Encounter (HOSPITAL_COMMUNITY): Payer: Self-pay

## 2010-11-20 ENCOUNTER — Encounter: Payer: Medicare Other | Admitting: *Deleted

## 2010-11-20 ENCOUNTER — Ambulatory Visit (INDEPENDENT_AMBULATORY_CARE_PROVIDER_SITE_OTHER): Payer: Medicare Other | Admitting: *Deleted

## 2010-11-20 ENCOUNTER — Encounter (HOSPITAL_COMMUNITY): Payer: Self-pay | Attending: Cardiology

## 2010-11-20 DIAGNOSIS — Z5189 Encounter for other specified aftercare: Secondary | ICD-10-CM | POA: Insufficient documentation

## 2010-11-20 DIAGNOSIS — Z951 Presence of aortocoronary bypass graft: Secondary | ICD-10-CM | POA: Insufficient documentation

## 2010-11-20 DIAGNOSIS — I359 Nonrheumatic aortic valve disorder, unspecified: Secondary | ICD-10-CM

## 2010-11-20 DIAGNOSIS — I10 Essential (primary) hypertension: Secondary | ICD-10-CM | POA: Insufficient documentation

## 2010-11-20 DIAGNOSIS — Z7901 Long term (current) use of anticoagulants: Secondary | ICD-10-CM

## 2010-11-20 DIAGNOSIS — Z952 Presence of prosthetic heart valve: Secondary | ICD-10-CM

## 2010-11-20 DIAGNOSIS — I2510 Atherosclerotic heart disease of native coronary artery without angina pectoris: Secondary | ICD-10-CM | POA: Insufficient documentation

## 2010-11-20 LAB — POCT INR: INR: 2.9

## 2010-11-21 ENCOUNTER — Encounter (HOSPITAL_COMMUNITY): Payer: Self-pay

## 2010-11-22 ENCOUNTER — Encounter (HOSPITAL_COMMUNITY): Payer: Self-pay

## 2010-11-27 ENCOUNTER — Encounter (HOSPITAL_COMMUNITY): Payer: Self-pay

## 2010-11-28 ENCOUNTER — Encounter (HOSPITAL_COMMUNITY): Payer: Self-pay

## 2010-11-29 ENCOUNTER — Encounter (HOSPITAL_COMMUNITY): Payer: Self-pay

## 2010-12-04 ENCOUNTER — Encounter (HOSPITAL_COMMUNITY): Payer: Medicare Other

## 2010-12-05 ENCOUNTER — Encounter (HOSPITAL_COMMUNITY): Payer: Self-pay

## 2010-12-06 ENCOUNTER — Encounter (HOSPITAL_COMMUNITY): Payer: Medicare Other

## 2010-12-11 ENCOUNTER — Encounter (HOSPITAL_COMMUNITY): Payer: Medicare Other

## 2010-12-12 ENCOUNTER — Encounter (HOSPITAL_COMMUNITY): Payer: Self-pay

## 2010-12-13 ENCOUNTER — Encounter (HOSPITAL_COMMUNITY): Payer: Medicare Other

## 2010-12-14 ENCOUNTER — Ambulatory Visit (INDEPENDENT_AMBULATORY_CARE_PROVIDER_SITE_OTHER): Payer: Medicare Other | Admitting: *Deleted

## 2010-12-14 ENCOUNTER — Encounter: Payer: Self-pay | Admitting: Cardiology

## 2010-12-14 ENCOUNTER — Ambulatory Visit (INDEPENDENT_AMBULATORY_CARE_PROVIDER_SITE_OTHER): Payer: Medicare Other | Admitting: Cardiology

## 2010-12-14 DIAGNOSIS — Z7901 Long term (current) use of anticoagulants: Secondary | ICD-10-CM

## 2010-12-14 DIAGNOSIS — Z9889 Other specified postprocedural states: Secondary | ICD-10-CM

## 2010-12-14 DIAGNOSIS — E785 Hyperlipidemia, unspecified: Secondary | ICD-10-CM

## 2010-12-14 DIAGNOSIS — I359 Nonrheumatic aortic valve disorder, unspecified: Secondary | ICD-10-CM

## 2010-12-14 DIAGNOSIS — Z954 Presence of other heart-valve replacement: Secondary | ICD-10-CM

## 2010-12-14 DIAGNOSIS — Z951 Presence of aortocoronary bypass graft: Secondary | ICD-10-CM

## 2010-12-14 DIAGNOSIS — Z952 Presence of prosthetic heart valve: Secondary | ICD-10-CM

## 2010-12-14 LAB — BASIC METABOLIC PANEL
Calcium: 9.5 mg/dL (ref 8.4–10.5)
Sodium: 143 mEq/L (ref 135–145)

## 2010-12-14 LAB — LIPID PANEL
Cholesterol: 209 mg/dL — ABNORMAL HIGH (ref 0–200)
HDL: 55.7 mg/dL (ref >39.00)
Total CHOL/HDL Ratio: 4
Triglycerides: 78 mg/dL (ref 0.0–149.0)
VLDL: 15.6 mg/dL (ref 0.0–40.0)

## 2010-12-14 LAB — HEPATIC FUNCTION PANEL: Bilirubin, Direct: 0.1 mg/dL (ref 0.0–0.3)

## 2010-12-14 NOTE — Progress Notes (Signed)
[name] Date of Birth: 06/20/1940   History of Present Illness: Tamara Patterson is seen today for followup. 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 This included mechanical aortic valve replacement and grafting of the aortic root with a #23 mm St. Jude valve conduit. She also had coronary bypass surgery including an IMA graft to the LAD, saphenous vein graft to the right coronary, and saphenous vein graft to the first and second obtuse marginal vessels. 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 She was treated with pulse steroids for a period of time and was weaned off of this. She has done very well since then. Her previous complaints of muscle aches and pains resolved with stopping her statin therapy. She denies any chest pain, shortness of breath, or palpitations.  Current Outpatient Prescriptions on File Prior to Visit  Medication Sig Dispense Refill  . CALCIUM-MAGNESIUM-VITAMIN D PO Take by mouth.        . citalopram (CELEXA) 20 MG tablet Take 20 mg by mouth daily.        Marland Kitchen warfarin (COUMADIN) 5 MG tablet TAKE AS DIRECTED  50 tablet  5    Allergies  Allergen Reactions  . Atenolol   . Codeine   . Lipitor (Atorvastatin Calcium) Other (See Comments)    Caused muscle aching    Past Medical History  Diagnosis Date  . S/P AVR (aortic valve replacement)     with root grafting; #39mm  St. Jude valve conduit; Last Echo 2007  . Hx of CABG 1991    LIMA-LAD, SVG-RCA, SVG-1st and 2ndOM  . Dyslipidemia   . HTN (hypertension)   . Anticoagulant long-term use   . Panic anxiety syndrome   . Giant cell arteritis     per aortotomy biopsy in 1991  . CAD (coronary artery disease)   . GERD (gastroesophageal reflux disease)     Past Surgical History  Procedure Date  . Coronary artery bypass graft   . Aortic valve replacement     with root grafting  . Cholecystectomy     History  Smoking status  .  Never Smoker   Smokeless tobacco  . Not on file    History  Alcohol Use No    Family History  Problem Relation Age of Onset  . Liver disease Mother   . Pancreatic cancer Father     Review of Systems: The review of systems is positive for GERD. Dexilant therapy caused her to have diarrhea so now she just takes Protonix as needed. All other systems were reviewed and are negative.  Physical Exam: BP 132/76  Pulse 68  Ht 5\' 6"  (1.676 m)  Wt 155 lb (70.308 kg)  BMI 25.02 kg/m2 The patient is alert and oriented x 3.  The mood and affect are normal.  The skin is warm and dry.  Color is normal.  The HEENT exam reveals that the sclera are nonicteric.  The mucous membranes are moist.  The carotids are 2+ without bruits.  There is no thyromegaly.  There is no JVD.  The lungs are clear.  The chest wall is non tender.  The heart exam reveals a regular rate with a normal Mechanical aortic valve click.  There are no murmurs, gallops, or rubs.  The PMI is not displaced.   Abdominal exam reveals good bowel sounds.  There is no guarding or rebound.  There is no hepatosplenomegaly or  tenderness.  There are no masses.  Exam of the legs reveal no clubbing, cyanosis, or edema.  The legs are without rashes.  The distal pulses are intact.  Cranial nerves II - XII are intact.  Motor and sensory functions are intact.  The gait is normal.  LABORATORY DATA: INR today was 3.0.  Assessment / Plan:

## 2010-12-14 NOTE — Assessment & Plan Note (Signed)
INR is therapeutic. We will followup again in 4 weeks.

## 2010-12-14 NOTE — Patient Instructions (Addendum)
Continue your current medications  I will see you again in 6 months.   

## 2010-12-14 NOTE — Assessment & Plan Note (Signed)
Her aortic valve function is normal by exam. Her last echocardiogram in February of this year showed good aortic valve function. Left ventricular function was normal at that time. She will continue with long-term anticoagulation. She needs SBE prophylaxis.

## 2010-12-14 NOTE — Assessment & Plan Note (Signed)
She has no anginal symptoms. She had a normal Myoview study in 2008. Her last cardiac catheterization in 2002 showed patent grafts.

## 2010-12-17 ENCOUNTER — Telehealth: Payer: Self-pay | Admitting: Cardiology

## 2010-12-17 DIAGNOSIS — I10 Essential (primary) hypertension: Secondary | ICD-10-CM

## 2010-12-17 NOTE — Telephone Encounter (Signed)
Called back to make an app for Bmet. Made her an app for Thurs, will call her w/other labs once Dr. [place] has reviewed.

## 2010-12-17 NOTE — Telephone Encounter (Signed)
Pt returning call from [place]. Please call pt back.

## 2010-12-18 ENCOUNTER — Telehealth: Payer: Self-pay | Admitting: *Deleted

## 2010-12-18 ENCOUNTER — Encounter (HOSPITAL_COMMUNITY): Payer: Self-pay | Attending: Cardiology

## 2010-12-18 DIAGNOSIS — I10 Essential (primary) hypertension: Secondary | ICD-10-CM | POA: Insufficient documentation

## 2010-12-18 DIAGNOSIS — I2510 Atherosclerotic heart disease of native coronary artery without angina pectoris: Secondary | ICD-10-CM | POA: Insufficient documentation

## 2010-12-18 DIAGNOSIS — Z5189 Encounter for other specified aftercare: Secondary | ICD-10-CM | POA: Insufficient documentation

## 2010-12-18 DIAGNOSIS — Z951 Presence of aortocoronary bypass graft: Secondary | ICD-10-CM | POA: Insufficient documentation

## 2010-12-18 DIAGNOSIS — I359 Nonrheumatic aortic valve disorder, unspecified: Secondary | ICD-10-CM | POA: Insufficient documentation

## 2010-12-18 NOTE — Telephone Encounter (Signed)
Message copied by Lorayne Bender on Tue Dec 18, 2010  3:53 PM ------      Message from: [place], PETER M      Created: Mon Dec 17, 2010  5:03 PM       Potassium is elevated without apparent cause. We will recheck in 1 week. This may be spurious. Avoid high K+ foods. Cholesterol is modestly elevated. Triglycerides ok. Lets try Zetia 10 mg daily since she is intolerant to statins.      Theron Arista [place]

## 2010-12-18 NOTE — Telephone Encounter (Signed)
Notified of lab results. Will start on Zetia 10 mg. Will give samples. Advised to let us know if unable to tolerate. Will send results to Dr. Wynelle Link

## 2010-12-19 ENCOUNTER — Encounter (HOSPITAL_COMMUNITY): Payer: Self-pay

## 2010-12-20 ENCOUNTER — Encounter (HOSPITAL_COMMUNITY): Payer: Self-pay

## 2010-12-20 ENCOUNTER — Other Ambulatory Visit (INDEPENDENT_AMBULATORY_CARE_PROVIDER_SITE_OTHER): Payer: Medicare Other | Admitting: *Deleted

## 2010-12-20 DIAGNOSIS — I10 Essential (primary) hypertension: Secondary | ICD-10-CM

## 2010-12-24 ENCOUNTER — Telehealth: Payer: Self-pay | Admitting: *Deleted

## 2010-12-24 NOTE — Telephone Encounter (Signed)
Notified of lab results. Will send to Dr. Wynelle Link

## 2010-12-24 NOTE — Telephone Encounter (Signed)
Message copied by Lorayne Bender on Mon Dec 24, 2010  9:56 AM ------      Message from: [place], PETER M      Created: Thu Dec 20, 2010  5:07 PM       Potassium is normal. I think prior result was spurious.      Theron Arista [place]

## 2010-12-25 ENCOUNTER — Encounter (HOSPITAL_COMMUNITY): Payer: Self-pay

## 2010-12-26 ENCOUNTER — Encounter (HOSPITAL_COMMUNITY): Payer: Self-pay

## 2010-12-27 ENCOUNTER — Encounter (HOSPITAL_COMMUNITY): Payer: Self-pay

## 2011-01-01 ENCOUNTER — Encounter (HOSPITAL_COMMUNITY): Payer: Self-pay

## 2011-01-02 ENCOUNTER — Encounter (HOSPITAL_COMMUNITY): Payer: Self-pay

## 2011-01-03 ENCOUNTER — Encounter (HOSPITAL_COMMUNITY): Payer: Self-pay

## 2011-01-08 ENCOUNTER — Encounter (HOSPITAL_COMMUNITY): Payer: Self-pay

## 2011-01-09 ENCOUNTER — Encounter (HOSPITAL_COMMUNITY): Payer: Self-pay

## 2011-01-10 ENCOUNTER — Encounter (HOSPITAL_COMMUNITY): Payer: Self-pay

## 2011-01-11 ENCOUNTER — Ambulatory Visit (INDEPENDENT_AMBULATORY_CARE_PROVIDER_SITE_OTHER): Payer: Medicare Other | Admitting: *Deleted

## 2011-01-11 DIAGNOSIS — Z7901 Long term (current) use of anticoagulants: Secondary | ICD-10-CM

## 2011-01-11 DIAGNOSIS — Z952 Presence of prosthetic heart valve: Secondary | ICD-10-CM

## 2011-01-11 DIAGNOSIS — I359 Nonrheumatic aortic valve disorder, unspecified: Secondary | ICD-10-CM

## 2011-01-15 ENCOUNTER — Encounter (HOSPITAL_COMMUNITY): Payer: Self-pay

## 2011-01-16 ENCOUNTER — Encounter (HOSPITAL_COMMUNITY): Payer: Self-pay

## 2011-01-17 ENCOUNTER — Encounter (HOSPITAL_COMMUNITY): Payer: Self-pay

## 2011-01-17 DIAGNOSIS — Z5189 Encounter for other specified aftercare: Secondary | ICD-10-CM | POA: Insufficient documentation

## 2011-01-17 DIAGNOSIS — Z951 Presence of aortocoronary bypass graft: Secondary | ICD-10-CM | POA: Insufficient documentation

## 2011-01-17 DIAGNOSIS — I10 Essential (primary) hypertension: Secondary | ICD-10-CM | POA: Insufficient documentation

## 2011-01-17 DIAGNOSIS — I359 Nonrheumatic aortic valve disorder, unspecified: Secondary | ICD-10-CM | POA: Insufficient documentation

## 2011-01-17 DIAGNOSIS — I2510 Atherosclerotic heart disease of native coronary artery without angina pectoris: Secondary | ICD-10-CM | POA: Insufficient documentation

## 2011-01-22 ENCOUNTER — Encounter (HOSPITAL_COMMUNITY): Payer: Self-pay

## 2011-01-23 ENCOUNTER — Encounter (HOSPITAL_COMMUNITY): Payer: Self-pay

## 2011-01-24 ENCOUNTER — Encounter (HOSPITAL_COMMUNITY): Payer: Self-pay

## 2011-01-29 ENCOUNTER — Encounter (HOSPITAL_COMMUNITY): Payer: Self-pay

## 2011-01-30 ENCOUNTER — Encounter (HOSPITAL_COMMUNITY): Payer: Self-pay

## 2011-01-31 ENCOUNTER — Encounter (HOSPITAL_COMMUNITY): Payer: Self-pay

## 2011-02-05 ENCOUNTER — Encounter (HOSPITAL_COMMUNITY): Payer: Self-pay

## 2011-02-06 ENCOUNTER — Encounter (HOSPITAL_COMMUNITY): Payer: Self-pay

## 2011-02-07 ENCOUNTER — Encounter (HOSPITAL_COMMUNITY): Payer: Self-pay

## 2011-02-12 ENCOUNTER — Ambulatory Visit (INDEPENDENT_AMBULATORY_CARE_PROVIDER_SITE_OTHER): Payer: Medicare Other | Admitting: *Deleted

## 2011-02-12 ENCOUNTER — Encounter (HOSPITAL_COMMUNITY)
Admission: RE | Admit: 2011-02-12 | Discharge: 2011-02-12 | Disposition: A | Discharge status: Home / Self Care | Payer: Self-pay | Source: Ambulatory Visit | Attending: Cardiology | Admitting: Cardiology

## 2011-02-12 DIAGNOSIS — Z7901 Long term (current) use of anticoagulants: Secondary | ICD-10-CM

## 2011-02-12 DIAGNOSIS — Z952 Presence of prosthetic heart valve: Secondary | ICD-10-CM

## 2011-02-12 DIAGNOSIS — I359 Nonrheumatic aortic valve disorder, unspecified: Secondary | ICD-10-CM

## 2011-02-13 ENCOUNTER — Encounter (HOSPITAL_COMMUNITY)
Admission: RE | Admit: 2011-02-13 | Discharge: 2011-02-13 | Disposition: A | Discharge status: Home / Self Care | Payer: Self-pay | Source: Ambulatory Visit | Attending: Cardiology | Admitting: Cardiology

## 2011-02-14 ENCOUNTER — Encounter (HOSPITAL_COMMUNITY)
Admission: RE | Admit: 2011-02-14 | Discharge: 2011-02-14 | Disposition: A | Discharge status: Home / Self Care | Payer: Self-pay | Source: Ambulatory Visit | Attending: Cardiology | Admitting: Cardiology

## 2011-02-15 ENCOUNTER — Telehealth: Payer: Self-pay | Admitting: Cardiology

## 2011-02-15 NOTE — Telephone Encounter (Signed)
New Msg: Pt calling wanting to talk to nurse regarding cholesterol problem and pt recent given samples of zetia. Please return pt call to discuss further.   Alt # [phone]

## 2011-02-18 NOTE — Telephone Encounter (Signed)
Called wanting to know if she has to stay on Zetia. Advised because Lipids are elevated and she can't take statins we need for her to stay on medication. States she has been having some aches but not serious. Will give her some samples and advised if aching continues to call us back.

## 2011-02-18 NOTE — Telephone Encounter (Signed)
F/U from previous call:  Returing call back to nurse. Patient states to call the home number vs the cell phone.

## 2011-02-19 ENCOUNTER — Encounter (HOSPITAL_COMMUNITY)
Admission: RE | Admit: 2011-02-19 | Discharge: 2011-02-19 | Disposition: A | Discharge status: Home / Self Care | Payer: Self-pay | Source: Ambulatory Visit | Attending: Cardiology | Admitting: Cardiology

## 2011-02-19 DIAGNOSIS — Z5189 Encounter for other specified aftercare: Secondary | ICD-10-CM | POA: Insufficient documentation

## 2011-02-19 DIAGNOSIS — I10 Essential (primary) hypertension: Secondary | ICD-10-CM | POA: Insufficient documentation

## 2011-02-19 DIAGNOSIS — Z951 Presence of aortocoronary bypass graft: Secondary | ICD-10-CM | POA: Insufficient documentation

## 2011-02-19 DIAGNOSIS — I2510 Atherosclerotic heart disease of native coronary artery without angina pectoris: Secondary | ICD-10-CM | POA: Insufficient documentation

## 2011-02-19 DIAGNOSIS — I359 Nonrheumatic aortic valve disorder, unspecified: Secondary | ICD-10-CM | POA: Insufficient documentation

## 2011-02-20 ENCOUNTER — Encounter (HOSPITAL_COMMUNITY): Payer: Self-pay

## 2011-02-21 ENCOUNTER — Encounter (HOSPITAL_COMMUNITY)
Admission: RE | Admit: 2011-02-21 | Discharge: 2011-02-21 | Disposition: A | Discharge status: Home / Self Care | Payer: Self-pay | Source: Ambulatory Visit | Attending: Cardiology | Admitting: Cardiology

## 2011-02-22 ENCOUNTER — Ambulatory Visit (INDEPENDENT_AMBULATORY_CARE_PROVIDER_SITE_OTHER): Payer: Medicare Other | Admitting: *Deleted

## 2011-02-22 DIAGNOSIS — I359 Nonrheumatic aortic valve disorder, unspecified: Secondary | ICD-10-CM

## 2011-02-22 DIAGNOSIS — Z7901 Long term (current) use of anticoagulants: Secondary | ICD-10-CM

## 2011-02-22 DIAGNOSIS — Z952 Presence of prosthetic heart valve: Secondary | ICD-10-CM

## 2011-02-22 LAB — POCT INR: INR: 3.1

## 2011-02-26 ENCOUNTER — Encounter (HOSPITAL_COMMUNITY): Payer: Self-pay

## 2011-02-27 ENCOUNTER — Encounter (HOSPITAL_COMMUNITY): Payer: Self-pay

## 2011-02-28 ENCOUNTER — Encounter (HOSPITAL_COMMUNITY): Payer: Self-pay

## 2011-03-05 ENCOUNTER — Encounter (HOSPITAL_COMMUNITY): Payer: Self-pay

## 2011-03-06 ENCOUNTER — Encounter (HOSPITAL_COMMUNITY): Payer: Self-pay

## 2011-03-07 ENCOUNTER — Encounter (HOSPITAL_COMMUNITY): Payer: Self-pay

## 2011-03-12 ENCOUNTER — Encounter (HOSPITAL_COMMUNITY): Payer: Self-pay

## 2011-03-13 ENCOUNTER — Encounter (HOSPITAL_COMMUNITY): Payer: Self-pay

## 2011-03-14 ENCOUNTER — Encounter (HOSPITAL_COMMUNITY): Payer: Self-pay

## 2011-03-19 ENCOUNTER — Encounter (HOSPITAL_COMMUNITY): Payer: Self-pay

## 2011-03-20 ENCOUNTER — Encounter (HOSPITAL_COMMUNITY): Payer: Self-pay

## 2011-03-21 ENCOUNTER — Encounter (HOSPITAL_COMMUNITY): Payer: Self-pay

## 2011-03-21 DIAGNOSIS — Z951 Presence of aortocoronary bypass graft: Secondary | ICD-10-CM | POA: Insufficient documentation

## 2011-03-21 DIAGNOSIS — I10 Essential (primary) hypertension: Secondary | ICD-10-CM | POA: Insufficient documentation

## 2011-03-21 DIAGNOSIS — I2510 Atherosclerotic heart disease of native coronary artery without angina pectoris: Secondary | ICD-10-CM | POA: Insufficient documentation

## 2011-03-21 DIAGNOSIS — I359 Nonrheumatic aortic valve disorder, unspecified: Secondary | ICD-10-CM | POA: Insufficient documentation

## 2011-03-21 DIAGNOSIS — Z5189 Encounter for other specified aftercare: Secondary | ICD-10-CM | POA: Insufficient documentation

## 2011-03-22 ENCOUNTER — Ambulatory Visit (INDEPENDENT_AMBULATORY_CARE_PROVIDER_SITE_OTHER): Payer: Medicare Other | Admitting: *Deleted

## 2011-03-22 DIAGNOSIS — I359 Nonrheumatic aortic valve disorder, unspecified: Secondary | ICD-10-CM

## 2011-03-22 DIAGNOSIS — Z7901 Long term (current) use of anticoagulants: Secondary | ICD-10-CM

## 2011-03-22 DIAGNOSIS — Z952 Presence of prosthetic heart valve: Secondary | ICD-10-CM

## 2011-03-22 LAB — POCT INR: INR: 2.7

## 2011-03-26 ENCOUNTER — Encounter (HOSPITAL_COMMUNITY): Payer: Self-pay

## 2011-03-27 ENCOUNTER — Encounter (HOSPITAL_COMMUNITY): Payer: Self-pay

## 2011-03-28 ENCOUNTER — Encounter (HOSPITAL_COMMUNITY): Payer: Self-pay

## 2011-04-02 ENCOUNTER — Encounter (HOSPITAL_COMMUNITY)
Admission: RE | Admit: 2011-04-02 | Discharge: 2011-04-02 | Disposition: A | Discharge status: Home / Self Care | Payer: Self-pay | Source: Ambulatory Visit | Attending: Cardiology | Admitting: Cardiology

## 2011-04-03 ENCOUNTER — Encounter (HOSPITAL_COMMUNITY)
Admission: RE | Admit: 2011-04-03 | Discharge: 2011-04-03 | Disposition: A | Discharge status: Home / Self Care | Payer: Self-pay | Source: Ambulatory Visit | Attending: Cardiology | Admitting: Cardiology

## 2011-04-04 ENCOUNTER — Encounter (HOSPITAL_COMMUNITY): Payer: Self-pay

## 2011-04-09 ENCOUNTER — Encounter (HOSPITAL_COMMUNITY): Payer: Self-pay

## 2011-04-09 ENCOUNTER — Telehealth: Payer: Self-pay | Admitting: Cardiology

## 2011-04-09 MED ORDER — EZETIMIBE 10 MG PO TABS: 10.0000 mg | ORAL_TABLET | Freq: Every day | ORAL | Status: DC

## 2011-04-09 NOTE — Telephone Encounter (Signed)
New Msg: Pt calling needing a refill of zetia 10 mg called into [facility] on [address]. Please return pt call to discuss further.

## 2011-04-10 ENCOUNTER — Encounter (HOSPITAL_COMMUNITY): Payer: Self-pay

## 2011-04-11 ENCOUNTER — Encounter (HOSPITAL_COMMUNITY): Payer: Self-pay

## 2011-04-16 ENCOUNTER — Encounter (HOSPITAL_COMMUNITY)
Admission: RE | Admit: 2011-04-16 | Discharge: 2011-04-16 | Disposition: A | Discharge status: Home / Self Care | Payer: Self-pay | Source: Ambulatory Visit | Attending: Cardiology | Admitting: Cardiology

## 2011-04-17 ENCOUNTER — Encounter (HOSPITAL_COMMUNITY)
Admission: RE | Admit: 2011-04-17 | Discharge: 2011-04-17 | Disposition: A | Discharge status: Home / Self Care | Payer: Self-pay | Source: Ambulatory Visit | Attending: Cardiology | Admitting: Cardiology

## 2011-04-18 ENCOUNTER — Encounter (HOSPITAL_COMMUNITY)
Admission: RE | Admit: 2011-04-18 | Discharge: 2011-04-18 | Disposition: A | Discharge status: Home / Self Care | Payer: Self-pay | Source: Ambulatory Visit | Attending: Cardiology | Admitting: Cardiology

## 2011-04-23 ENCOUNTER — Encounter (HOSPITAL_COMMUNITY): Payer: Self-pay

## 2011-04-23 ENCOUNTER — Ambulatory Visit (INDEPENDENT_AMBULATORY_CARE_PROVIDER_SITE_OTHER): Payer: Medicare Other | Admitting: Pharmacist

## 2011-04-23 DIAGNOSIS — I359 Nonrheumatic aortic valve disorder, unspecified: Secondary | ICD-10-CM | POA: Insufficient documentation

## 2011-04-23 DIAGNOSIS — Z5189 Encounter for other specified aftercare: Secondary | ICD-10-CM | POA: Insufficient documentation

## 2011-04-23 DIAGNOSIS — Z7901 Long term (current) use of anticoagulants: Secondary | ICD-10-CM

## 2011-04-23 DIAGNOSIS — Z954 Presence of other heart-valve replacement: Secondary | ICD-10-CM

## 2011-04-23 DIAGNOSIS — I10 Essential (primary) hypertension: Secondary | ICD-10-CM | POA: Insufficient documentation

## 2011-04-23 DIAGNOSIS — Z951 Presence of aortocoronary bypass graft: Secondary | ICD-10-CM | POA: Insufficient documentation

## 2011-04-23 DIAGNOSIS — I2510 Atherosclerotic heart disease of native coronary artery without angina pectoris: Secondary | ICD-10-CM | POA: Insufficient documentation

## 2011-04-23 DIAGNOSIS — Z952 Presence of prosthetic heart valve: Secondary | ICD-10-CM

## 2011-04-24 ENCOUNTER — Encounter (HOSPITAL_COMMUNITY): Payer: Self-pay

## 2011-04-25 ENCOUNTER — Encounter (HOSPITAL_COMMUNITY): Payer: Self-pay

## 2011-04-30 ENCOUNTER — Encounter (HOSPITAL_COMMUNITY)
Admission: RE | Admit: 2011-04-30 | Discharge: 2011-04-30 | Disposition: A | Discharge status: Home / Self Care | Payer: Self-pay | Source: Ambulatory Visit | Attending: Cardiology | Admitting: Cardiology

## 2011-05-01 ENCOUNTER — Encounter (HOSPITAL_COMMUNITY): Payer: Self-pay

## 2011-05-02 ENCOUNTER — Encounter (HOSPITAL_COMMUNITY): Payer: Self-pay

## 2011-05-07 ENCOUNTER — Encounter (HOSPITAL_COMMUNITY): Payer: Self-pay

## 2011-05-08 ENCOUNTER — Encounter (HOSPITAL_COMMUNITY): Payer: Self-pay

## 2011-05-09 ENCOUNTER — Encounter (HOSPITAL_COMMUNITY): Payer: Self-pay

## 2011-05-14 ENCOUNTER — Encounter (HOSPITAL_COMMUNITY): Payer: Self-pay

## 2011-05-15 ENCOUNTER — Encounter (HOSPITAL_COMMUNITY)
Admission: RE | Admit: 2011-05-15 | Discharge: 2011-05-15 | Disposition: A | Discharge status: Home / Self Care | Payer: Self-pay | Source: Ambulatory Visit | Attending: Cardiology | Admitting: Cardiology

## 2011-05-16 ENCOUNTER — Encounter (HOSPITAL_COMMUNITY): Payer: Self-pay

## 2011-05-21 ENCOUNTER — Encounter (HOSPITAL_COMMUNITY): Payer: Self-pay

## 2011-05-21 ENCOUNTER — Ambulatory Visit (INDEPENDENT_AMBULATORY_CARE_PROVIDER_SITE_OTHER): Payer: Medicare Other

## 2011-05-21 ENCOUNTER — Encounter: Payer: Self-pay | Admitting: Cardiology

## 2011-05-21 DIAGNOSIS — I2510 Atherosclerotic heart disease of native coronary artery without angina pectoris: Secondary | ICD-10-CM | POA: Insufficient documentation

## 2011-05-21 DIAGNOSIS — Z7901 Long term (current) use of anticoagulants: Secondary | ICD-10-CM

## 2011-05-21 DIAGNOSIS — Z951 Presence of aortocoronary bypass graft: Secondary | ICD-10-CM | POA: Insufficient documentation

## 2011-05-21 DIAGNOSIS — I10 Essential (primary) hypertension: Secondary | ICD-10-CM | POA: Insufficient documentation

## 2011-05-21 DIAGNOSIS — Z5189 Encounter for other specified aftercare: Secondary | ICD-10-CM | POA: Insufficient documentation

## 2011-05-21 DIAGNOSIS — Z952 Presence of prosthetic heart valve: Secondary | ICD-10-CM

## 2011-05-21 DIAGNOSIS — I359 Nonrheumatic aortic valve disorder, unspecified: Secondary | ICD-10-CM

## 2011-05-21 LAB — POCT INR: INR: 2.6

## 2011-05-22 ENCOUNTER — Encounter (HOSPITAL_COMMUNITY): Payer: Self-pay

## 2011-05-23 ENCOUNTER — Encounter (HOSPITAL_COMMUNITY): Payer: Self-pay

## 2011-05-28 ENCOUNTER — Encounter (HOSPITAL_COMMUNITY): Payer: Self-pay

## 2011-05-29 ENCOUNTER — Encounter (HOSPITAL_COMMUNITY): Payer: Self-pay

## 2011-05-30 ENCOUNTER — Encounter (HOSPITAL_COMMUNITY): Payer: Self-pay

## 2011-06-04 ENCOUNTER — Ambulatory Visit (INDEPENDENT_AMBULATORY_CARE_PROVIDER_SITE_OTHER): Payer: Medicare Other | Admitting: *Deleted

## 2011-06-04 ENCOUNTER — Encounter (HOSPITAL_COMMUNITY): Payer: Self-pay

## 2011-06-04 DIAGNOSIS — Z7901 Long term (current) use of anticoagulants: Secondary | ICD-10-CM

## 2011-06-04 DIAGNOSIS — Z952 Presence of prosthetic heart valve: Secondary | ICD-10-CM

## 2011-06-04 DIAGNOSIS — I359 Nonrheumatic aortic valve disorder, unspecified: Secondary | ICD-10-CM

## 2011-06-05 ENCOUNTER — Encounter (HOSPITAL_COMMUNITY): Payer: Self-pay

## 2011-06-06 ENCOUNTER — Encounter (HOSPITAL_COMMUNITY): Payer: Self-pay

## 2011-06-11 ENCOUNTER — Encounter (HOSPITAL_COMMUNITY): Payer: Self-pay

## 2011-06-12 ENCOUNTER — Encounter (HOSPITAL_COMMUNITY)
Admission: RE | Admit: 2011-06-12 | Discharge: 2011-06-12 | Disposition: A | Discharge status: Home / Self Care | Payer: Self-pay | Source: Ambulatory Visit | Attending: Cardiology | Admitting: Cardiology

## 2011-06-13 ENCOUNTER — Encounter (HOSPITAL_COMMUNITY): Payer: Self-pay

## 2011-06-18 ENCOUNTER — Encounter (HOSPITAL_COMMUNITY): Payer: Medicare Other

## 2011-06-19 ENCOUNTER — Ambulatory Visit (INDEPENDENT_AMBULATORY_CARE_PROVIDER_SITE_OTHER): Payer: Medicare Other | Admitting: *Deleted

## 2011-06-19 ENCOUNTER — Encounter (HOSPITAL_COMMUNITY): Payer: Medicare Other

## 2011-06-19 ENCOUNTER — Encounter: Payer: Self-pay | Admitting: Cardiology

## 2011-06-19 ENCOUNTER — Ambulatory Visit (INDEPENDENT_AMBULATORY_CARE_PROVIDER_SITE_OTHER): Payer: Medicare Other | Admitting: Cardiology

## 2011-06-19 VITALS — BP 130/70 | HR 62 | Wt 157.0 lb

## 2011-06-19 DIAGNOSIS — Z954 Presence of other heart-valve replacement: Secondary | ICD-10-CM

## 2011-06-19 DIAGNOSIS — I2510 Atherosclerotic heart disease of native coronary artery without angina pectoris: Secondary | ICD-10-CM

## 2011-06-19 DIAGNOSIS — Z952 Presence of prosthetic heart valve: Secondary | ICD-10-CM

## 2011-06-19 DIAGNOSIS — Z7901 Long term (current) use of anticoagulants: Secondary | ICD-10-CM

## 2011-06-19 DIAGNOSIS — I10 Essential (primary) hypertension: Secondary | ICD-10-CM

## 2011-06-19 DIAGNOSIS — I359 Nonrheumatic aortic valve disorder, unspecified: Secondary | ICD-10-CM

## 2011-06-19 DIAGNOSIS — Z951 Presence of aortocoronary bypass graft: Secondary | ICD-10-CM

## 2011-06-19 LAB — POCT INR: INR: 2.9

## 2011-06-19 NOTE — Progress Notes (Signed)
[name] Date of Birth: 12-08-1940   History of Present Illness: Tamara Patterson is seen today for followup. 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 This included mechanical aortic valve replacement and grafting of the aortic root with a #23 mm St. Jude valve conduit. She also had coronary bypass surgery including an IMA graft to the LAD, saphenous vein graft to the right coronary, and saphenous vein graft to the first and second obtuse marginal vessels. 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 She was treated with pulse steroids for a period of time and was weaned off of this. She has done very well since then.  She was experiencing significant myalgias. He was evaluated by rheumatology. It was felt that her symptoms were related to statin therapy. Stopping her statin therapy her symptoms have improved significantly. She still has occasional aches and pains. She denies any significant palpitations. She is now taking Zetia for cholesterol.  Current Outpatient Prescriptions on File Prior to Visit  Medication Sig Dispense Refill  . CALCIUM-MAGNESIUM-VITAMIN D PO Take by mouth.        . citalopram (CELEXA) 20 MG tablet Take 20 mg by mouth daily.        Marland Kitchen ezetimibe (ZETIA) 10 MG tablet Take 1 tablet (10 mg total) by mouth daily.  30 tablet  6  . Multiple Vitamin (MULTI-VITAMIN PO) Take by mouth.        . warfarin (COUMADIN) 5 MG tablet TAKE AS DIRECTED  50 tablet  5    Allergies  Allergen Reactions  . Atenolol   . Codeine   . Lipitor (Atorvastatin Calcium) Other (See Comments)    Caused muscle aching    Past Medical History  Diagnosis Date  . S/P AVR (aortic valve replacement)     with root grafting; #66mm  St. Jude valve conduit; Last Echo 2007  . Hx of CABG 1991    LIMA-LAD, SVG-RCA, SVG-1st and 2ndOM  . Dyslipidemia   . HTN (hypertension)   . Anticoagulant long-term use   . Panic anxiety  syndrome   . Giant cell arteritis     per aortotomy biopsy in 1991  . CAD (coronary artery disease)   . GERD (gastroesophageal reflux disease)     Past Surgical History  Procedure Date  . Coronary artery bypass graft   . Aortic valve replacement     with root grafting  . Cholecystectomy     History  Smoking status  . Never Smoker   Smokeless tobacco  . Not on file    History  Alcohol Use No    Family History  Problem Relation Age of Onset  . Liver disease Mother   . Pancreatic cancer Father     Review of Systems: As noted in history of present illness. All other systems were reviewed and are negative.  Physical Exam: BP 130/70  Pulse 62  Wt 157 lb (71.215 kg) The patient is alert and oriented x 3.  The mood and affect are normal.  The skin is warm and dry.  Color is normal.  The HEENT exam reveals that the sclera are nonicteric.  The mucous membranes are moist.  The carotids are 2+ without bruits.  There is no thyromegaly.  There is no JVD.  The lungs are clear.  The chest wall is non tender.  The heart exam reveals a regular rate with a normal Mechanical aortic valve  click.  There are no murmurs, gallops, or rubs.  The PMI is not displaced.   Abdominal exam reveals good bowel sounds.  There is no guarding or rebound.  There is no hepatosplenomegaly or tenderness.  There are no masses.  Exam of the legs reveal no clubbing, cyanosis, or edema.  The legs are without rashes.  The distal pulses are intact.  Cranial nerves II - XII are intact.  Motor and sensory functions are intact.  The gait is normal.  LABORATORY DATA: INR today was 2.9. ECG today demonstrates normal sinus rhythm with LVH and QRS widening. There were no acute changes.   Assessment / Plan:

## 2011-06-19 NOTE — Assessment & Plan Note (Signed)
He has a good valve click on exam. She is asymptomatic. Is therapeutic on her anticoagulation. Continue with her current therapy and followup again in 6 months.

## 2011-06-19 NOTE — Patient Instructions (Signed)
Continue your current medication.  I will see you again in 6 months.   

## 2011-06-19 NOTE — Assessment & Plan Note (Signed)
She has a history of ostial coronary disease and is status post CABG. She remains asymptomatic. Continue with her current medical therapy.

## 2011-06-20 ENCOUNTER — Encounter (HOSPITAL_COMMUNITY): Payer: Medicare Other

## 2011-06-25 ENCOUNTER — Encounter (HOSPITAL_COMMUNITY): Payer: Medicare Other

## 2011-06-26 ENCOUNTER — Encounter (HOSPITAL_COMMUNITY): Payer: Medicare Other

## 2011-06-27 ENCOUNTER — Encounter (HOSPITAL_COMMUNITY): Payer: Medicare Other

## 2011-07-02 ENCOUNTER — Encounter (HOSPITAL_COMMUNITY): Payer: Medicare Other

## 2011-07-03 ENCOUNTER — Encounter (HOSPITAL_COMMUNITY): Payer: Medicare Other

## 2011-07-04 ENCOUNTER — Encounter (HOSPITAL_COMMUNITY): Payer: Medicare Other

## 2011-07-09 ENCOUNTER — Encounter (HOSPITAL_COMMUNITY): Payer: Medicare Other

## 2011-07-10 ENCOUNTER — Encounter (HOSPITAL_COMMUNITY): Payer: Medicare Other

## 2011-07-10 ENCOUNTER — Ambulatory Visit (INDEPENDENT_AMBULATORY_CARE_PROVIDER_SITE_OTHER): Payer: Medicare Other | Admitting: *Deleted

## 2011-07-10 DIAGNOSIS — Z7901 Long term (current) use of anticoagulants: Secondary | ICD-10-CM

## 2011-07-10 DIAGNOSIS — Z952 Presence of prosthetic heart valve: Secondary | ICD-10-CM

## 2011-07-10 DIAGNOSIS — I359 Nonrheumatic aortic valve disorder, unspecified: Secondary | ICD-10-CM

## 2011-07-10 LAB — POCT INR: INR: 2.3

## 2011-07-11 ENCOUNTER — Encounter (HOSPITAL_COMMUNITY): Payer: Medicare Other

## 2011-07-16 ENCOUNTER — Encounter (HOSPITAL_COMMUNITY): Payer: Medicare Other

## 2011-07-17 ENCOUNTER — Encounter (HOSPITAL_COMMUNITY): Payer: Medicare Other

## 2011-07-18 ENCOUNTER — Encounter (HOSPITAL_COMMUNITY): Payer: Medicare Other

## 2011-07-23 ENCOUNTER — Encounter (HOSPITAL_COMMUNITY): Payer: Medicare Other

## 2011-07-24 ENCOUNTER — Encounter (HOSPITAL_COMMUNITY): Payer: Medicare Other

## 2011-07-25 ENCOUNTER — Encounter (HOSPITAL_COMMUNITY): Payer: Medicare Other

## 2011-07-30 ENCOUNTER — Encounter (HOSPITAL_COMMUNITY): Payer: Medicare Other

## 2011-07-31 ENCOUNTER — Encounter (HOSPITAL_COMMUNITY): Payer: Medicare Other

## 2011-08-01 ENCOUNTER — Encounter (HOSPITAL_COMMUNITY): Payer: Medicare Other

## 2011-08-06 ENCOUNTER — Encounter (HOSPITAL_COMMUNITY): Payer: Medicare Other

## 2011-08-07 ENCOUNTER — Encounter (HOSPITAL_COMMUNITY): Payer: Medicare Other

## 2011-08-08 ENCOUNTER — Encounter (HOSPITAL_COMMUNITY): Payer: Medicare Other

## 2011-08-13 ENCOUNTER — Encounter (HOSPITAL_COMMUNITY): Payer: Medicare Other

## 2011-08-14 ENCOUNTER — Encounter (HOSPITAL_COMMUNITY): Payer: Medicare Other

## 2011-08-15 ENCOUNTER — Encounter (HOSPITAL_COMMUNITY): Payer: Medicare Other

## 2011-08-16 ENCOUNTER — Ambulatory Visit (INDEPENDENT_AMBULATORY_CARE_PROVIDER_SITE_OTHER): Payer: Medicare Other

## 2011-08-16 ENCOUNTER — Other Ambulatory Visit: Payer: Self-pay | Admitting: *Deleted

## 2011-08-16 DIAGNOSIS — Z7901 Long term (current) use of anticoagulants: Secondary | ICD-10-CM

## 2011-08-16 DIAGNOSIS — I359 Nonrheumatic aortic valve disorder, unspecified: Secondary | ICD-10-CM

## 2011-08-16 DIAGNOSIS — Z954 Presence of other heart-valve replacement: Secondary | ICD-10-CM

## 2011-08-16 DIAGNOSIS — Z952 Presence of prosthetic heart valve: Secondary | ICD-10-CM

## 2011-08-16 MED ORDER — WARFARIN SODIUM 5 MG PO TABS: ORAL_TABLET | ORAL | Status: DC

## 2011-08-20 ENCOUNTER — Encounter (HOSPITAL_COMMUNITY): Payer: Medicare Other

## 2011-08-21 ENCOUNTER — Encounter (HOSPITAL_COMMUNITY): Payer: Medicare Other

## 2011-08-22 ENCOUNTER — Encounter (HOSPITAL_COMMUNITY): Payer: Medicare Other

## 2011-08-22 ENCOUNTER — Ambulatory Visit (INDEPENDENT_AMBULATORY_CARE_PROVIDER_SITE_OTHER): Payer: Medicare Other | Admitting: Pharmacist

## 2011-08-22 DIAGNOSIS — Z7901 Long term (current) use of anticoagulants: Secondary | ICD-10-CM

## 2011-08-22 DIAGNOSIS — I359 Nonrheumatic aortic valve disorder, unspecified: Secondary | ICD-10-CM

## 2011-08-22 DIAGNOSIS — Z952 Presence of prosthetic heart valve: Secondary | ICD-10-CM

## 2011-08-22 LAB — POCT INR: INR: 2.6

## 2011-08-27 ENCOUNTER — Encounter (HOSPITAL_COMMUNITY): Payer: Medicare Other

## 2011-08-28 ENCOUNTER — Encounter (HOSPITAL_COMMUNITY): Payer: Medicare Other

## 2011-08-29 ENCOUNTER — Encounter (HOSPITAL_COMMUNITY): Payer: Medicare Other

## 2011-09-03 ENCOUNTER — Encounter (HOSPITAL_COMMUNITY): Payer: Medicare Other

## 2011-09-04 ENCOUNTER — Encounter (HOSPITAL_COMMUNITY): Payer: Medicare Other

## 2011-09-05 ENCOUNTER — Encounter (HOSPITAL_COMMUNITY): Payer: Medicare Other

## 2011-09-05 ENCOUNTER — Ambulatory Visit (INDEPENDENT_AMBULATORY_CARE_PROVIDER_SITE_OTHER): Payer: Medicare Other | Admitting: *Deleted

## 2011-09-05 DIAGNOSIS — Z954 Presence of other heart-valve replacement: Secondary | ICD-10-CM

## 2011-09-05 DIAGNOSIS — I359 Nonrheumatic aortic valve disorder, unspecified: Secondary | ICD-10-CM

## 2011-09-05 DIAGNOSIS — Z952 Presence of prosthetic heart valve: Secondary | ICD-10-CM

## 2011-09-05 DIAGNOSIS — Z7901 Long term (current) use of anticoagulants: Secondary | ICD-10-CM

## 2011-09-05 LAB — POCT INR: INR: 3.5

## 2011-09-10 ENCOUNTER — Encounter (HOSPITAL_COMMUNITY): Payer: Medicare Other

## 2011-09-11 ENCOUNTER — Encounter (HOSPITAL_COMMUNITY): Payer: Medicare Other

## 2011-09-12 ENCOUNTER — Encounter (HOSPITAL_COMMUNITY): Payer: Medicare Other

## 2011-09-17 ENCOUNTER — Encounter (HOSPITAL_COMMUNITY): Payer: Medicare Other

## 2011-09-18 ENCOUNTER — Encounter (HOSPITAL_COMMUNITY): Payer: Medicare Other

## 2011-09-19 ENCOUNTER — Encounter (HOSPITAL_COMMUNITY): Payer: Medicare Other

## 2011-09-24 ENCOUNTER — Encounter (HOSPITAL_COMMUNITY): Payer: Medicare Other

## 2011-09-25 ENCOUNTER — Encounter (HOSPITAL_COMMUNITY): Payer: Medicare Other

## 2011-09-26 ENCOUNTER — Encounter (HOSPITAL_COMMUNITY): Payer: Medicare Other

## 2011-09-30 ENCOUNTER — Ambulatory Visit (INDEPENDENT_AMBULATORY_CARE_PROVIDER_SITE_OTHER): Payer: Medicare Other

## 2011-09-30 DIAGNOSIS — Z7901 Long term (current) use of anticoagulants: Secondary | ICD-10-CM

## 2011-09-30 DIAGNOSIS — Z954 Presence of other heart-valve replacement: Secondary | ICD-10-CM

## 2011-09-30 DIAGNOSIS — I359 Nonrheumatic aortic valve disorder, unspecified: Secondary | ICD-10-CM

## 2011-09-30 DIAGNOSIS — Z952 Presence of prosthetic heart valve: Secondary | ICD-10-CM

## 2011-09-30 LAB — POCT INR: INR: 2.7

## 2011-10-01 ENCOUNTER — Encounter (HOSPITAL_COMMUNITY): Payer: Medicare Other

## 2011-10-02 ENCOUNTER — Encounter (HOSPITAL_COMMUNITY): Payer: Medicare Other

## 2011-10-03 ENCOUNTER — Encounter (HOSPITAL_COMMUNITY): Payer: Medicare Other

## 2011-10-08 ENCOUNTER — Encounter (HOSPITAL_COMMUNITY): Payer: Medicare Other

## 2011-10-09 ENCOUNTER — Encounter (HOSPITAL_COMMUNITY): Payer: Medicare Other

## 2011-10-10 ENCOUNTER — Encounter (HOSPITAL_COMMUNITY): Payer: Medicare Other

## 2011-10-15 ENCOUNTER — Encounter (HOSPITAL_COMMUNITY): Payer: Medicare Other

## 2011-10-16 ENCOUNTER — Encounter (HOSPITAL_COMMUNITY): Payer: Medicare Other

## 2011-10-17 ENCOUNTER — Encounter (HOSPITAL_COMMUNITY): Payer: Medicare Other

## 2011-10-17 ENCOUNTER — Ambulatory Visit (INDEPENDENT_AMBULATORY_CARE_PROVIDER_SITE_OTHER): Payer: Medicare Other | Admitting: *Deleted

## 2011-10-17 DIAGNOSIS — Z952 Presence of prosthetic heart valve: Secondary | ICD-10-CM

## 2011-10-17 DIAGNOSIS — Z7901 Long term (current) use of anticoagulants: Secondary | ICD-10-CM

## 2011-10-17 DIAGNOSIS — I359 Nonrheumatic aortic valve disorder, unspecified: Secondary | ICD-10-CM

## 2011-10-17 DIAGNOSIS — Z954 Presence of other heart-valve replacement: Secondary | ICD-10-CM

## 2011-10-17 LAB — POCT INR: INR: 4.2

## 2011-10-22 ENCOUNTER — Encounter (HOSPITAL_COMMUNITY): Payer: Medicare Other

## 2011-10-23 ENCOUNTER — Encounter (HOSPITAL_COMMUNITY): Payer: Medicare Other

## 2011-10-24 ENCOUNTER — Encounter (HOSPITAL_COMMUNITY): Payer: Medicare Other

## 2011-10-29 ENCOUNTER — Encounter (HOSPITAL_COMMUNITY): Payer: Medicare Other

## 2011-10-30 ENCOUNTER — Encounter (HOSPITAL_COMMUNITY): Payer: Medicare Other

## 2011-10-31 ENCOUNTER — Encounter (HOSPITAL_COMMUNITY): Payer: Medicare Other

## 2011-10-31 ENCOUNTER — Ambulatory Visit (INDEPENDENT_AMBULATORY_CARE_PROVIDER_SITE_OTHER): Payer: Medicare Other | Admitting: *Deleted

## 2011-10-31 DIAGNOSIS — Z952 Presence of prosthetic heart valve: Secondary | ICD-10-CM

## 2011-10-31 DIAGNOSIS — Z7901 Long term (current) use of anticoagulants: Secondary | ICD-10-CM

## 2011-10-31 DIAGNOSIS — I359 Nonrheumatic aortic valve disorder, unspecified: Secondary | ICD-10-CM

## 2011-10-31 DIAGNOSIS — Z954 Presence of other heart-valve replacement: Secondary | ICD-10-CM

## 2011-10-31 LAB — POCT INR: INR: 3

## 2011-11-05 ENCOUNTER — Encounter (HOSPITAL_COMMUNITY): Payer: Medicare Other

## 2011-11-06 ENCOUNTER — Encounter (HOSPITAL_COMMUNITY): Payer: Medicare Other

## 2011-11-07 ENCOUNTER — Encounter (HOSPITAL_COMMUNITY): Payer: Medicare Other

## 2011-11-12 ENCOUNTER — Encounter (HOSPITAL_COMMUNITY): Payer: Medicare Other

## 2011-11-13 ENCOUNTER — Encounter (HOSPITAL_COMMUNITY): Payer: Medicare Other

## 2011-11-14 ENCOUNTER — Encounter (HOSPITAL_COMMUNITY): Payer: Medicare Other

## 2011-11-15 ENCOUNTER — Other Ambulatory Visit: Payer: Self-pay | Admitting: Infectious Diseases

## 2011-11-15 ENCOUNTER — Ambulatory Visit
Admission: RE | Admit: 2011-11-15 | Discharge: 2011-11-15 | Disposition: A | Discharge status: Home / Self Care | Payer: No Typology Code available for payment source | Source: Ambulatory Visit | Attending: Infectious Diseases | Admitting: Infectious Diseases

## 2011-11-15 DIAGNOSIS — R7611 Nonspecific reaction to tuberculin skin test without active tuberculosis: Secondary | ICD-10-CM

## 2011-11-28 ENCOUNTER — Ambulatory Visit (INDEPENDENT_AMBULATORY_CARE_PROVIDER_SITE_OTHER): Payer: Medicare Other | Admitting: Pharmacist

## 2011-11-28 DIAGNOSIS — I359 Nonrheumatic aortic valve disorder, unspecified: Secondary | ICD-10-CM

## 2011-11-28 DIAGNOSIS — Z952 Presence of prosthetic heart valve: Secondary | ICD-10-CM

## 2011-11-28 DIAGNOSIS — Z954 Presence of other heart-valve replacement: Secondary | ICD-10-CM

## 2011-11-28 DIAGNOSIS — Z7901 Long term (current) use of anticoagulants: Secondary | ICD-10-CM

## 2011-12-23 ENCOUNTER — Ambulatory Visit (INDEPENDENT_AMBULATORY_CARE_PROVIDER_SITE_OTHER): Payer: Medicare Other | Admitting: *Deleted

## 2011-12-23 DIAGNOSIS — Z7901 Long term (current) use of anticoagulants: Secondary | ICD-10-CM

## 2011-12-23 DIAGNOSIS — Z952 Presence of prosthetic heart valve: Secondary | ICD-10-CM

## 2011-12-23 DIAGNOSIS — Z954 Presence of other heart-valve replacement: Secondary | ICD-10-CM

## 2011-12-23 DIAGNOSIS — I359 Nonrheumatic aortic valve disorder, unspecified: Secondary | ICD-10-CM

## 2011-12-30 ENCOUNTER — Ambulatory Visit (INDEPENDENT_AMBULATORY_CARE_PROVIDER_SITE_OTHER): Payer: Medicare Other | Admitting: *Deleted

## 2011-12-30 DIAGNOSIS — I359 Nonrheumatic aortic valve disorder, unspecified: Secondary | ICD-10-CM

## 2011-12-30 DIAGNOSIS — Z952 Presence of prosthetic heart valve: Secondary | ICD-10-CM

## 2011-12-30 DIAGNOSIS — Z954 Presence of other heart-valve replacement: Secondary | ICD-10-CM

## 2011-12-30 DIAGNOSIS — Z7901 Long term (current) use of anticoagulants: Secondary | ICD-10-CM

## 2012-01-13 ENCOUNTER — Ambulatory Visit (INDEPENDENT_AMBULATORY_CARE_PROVIDER_SITE_OTHER): Payer: Medicare Other

## 2012-01-13 DIAGNOSIS — I359 Nonrheumatic aortic valve disorder, unspecified: Secondary | ICD-10-CM

## 2012-01-13 DIAGNOSIS — Z7901 Long term (current) use of anticoagulants: Secondary | ICD-10-CM

## 2012-01-13 DIAGNOSIS — Z954 Presence of other heart-valve replacement: Secondary | ICD-10-CM

## 2012-01-13 DIAGNOSIS — Z952 Presence of prosthetic heart valve: Secondary | ICD-10-CM

## 2012-01-13 LAB — POCT INR: INR: 3.1

## 2012-01-29 ENCOUNTER — Other Ambulatory Visit: Payer: Self-pay | Admitting: *Deleted

## 2012-01-29 MED ORDER — WARFARIN SODIUM 5 MG PO TABS: ORAL_TABLET | ORAL | Status: DC

## 2012-02-03 ENCOUNTER — Ambulatory Visit (INDEPENDENT_AMBULATORY_CARE_PROVIDER_SITE_OTHER): Payer: Medicare Other

## 2012-02-03 DIAGNOSIS — Z952 Presence of prosthetic heart valve: Secondary | ICD-10-CM

## 2012-02-03 DIAGNOSIS — I359 Nonrheumatic aortic valve disorder, unspecified: Secondary | ICD-10-CM

## 2012-02-03 DIAGNOSIS — Z954 Presence of other heart-valve replacement: Secondary | ICD-10-CM

## 2012-02-03 DIAGNOSIS — Z7901 Long term (current) use of anticoagulants: Secondary | ICD-10-CM

## 2012-02-03 LAB — POCT INR: INR: 3.4

## 2012-03-02 ENCOUNTER — Ambulatory Visit (INDEPENDENT_AMBULATORY_CARE_PROVIDER_SITE_OTHER): Payer: Medicare Other | Admitting: *Deleted

## 2012-03-02 DIAGNOSIS — I359 Nonrheumatic aortic valve disorder, unspecified: Secondary | ICD-10-CM

## 2012-03-02 DIAGNOSIS — Z7901 Long term (current) use of anticoagulants: Secondary | ICD-10-CM

## 2012-03-02 DIAGNOSIS — Z954 Presence of other heart-valve replacement: Secondary | ICD-10-CM

## 2012-03-02 DIAGNOSIS — Z952 Presence of prosthetic heart valve: Secondary | ICD-10-CM

## 2012-03-02 LAB — POCT INR: INR: 4

## 2012-03-23 ENCOUNTER — Ambulatory Visit (INDEPENDENT_AMBULATORY_CARE_PROVIDER_SITE_OTHER): Payer: Medicare Other

## 2012-03-23 DIAGNOSIS — Z7901 Long term (current) use of anticoagulants: Secondary | ICD-10-CM

## 2012-03-23 DIAGNOSIS — Z952 Presence of prosthetic heart valve: Secondary | ICD-10-CM

## 2012-03-23 DIAGNOSIS — Z954 Presence of other heart-valve replacement: Secondary | ICD-10-CM

## 2012-03-23 DIAGNOSIS — I359 Nonrheumatic aortic valve disorder, unspecified: Secondary | ICD-10-CM

## 2012-03-23 LAB — POCT INR: INR: 2.3

## 2012-04-17 ENCOUNTER — Ambulatory Visit (INDEPENDENT_AMBULATORY_CARE_PROVIDER_SITE_OTHER): Payer: Medicare Other | Admitting: *Deleted

## 2012-04-17 DIAGNOSIS — Z7901 Long term (current) use of anticoagulants: Secondary | ICD-10-CM

## 2012-04-17 DIAGNOSIS — I359 Nonrheumatic aortic valve disorder, unspecified: Secondary | ICD-10-CM

## 2012-04-17 DIAGNOSIS — Z954 Presence of other heart-valve replacement: Secondary | ICD-10-CM

## 2012-04-17 DIAGNOSIS — Z952 Presence of prosthetic heart valve: Secondary | ICD-10-CM

## 2012-04-17 LAB — POCT INR: INR: 5.7

## 2012-04-24 ENCOUNTER — Ambulatory Visit (INDEPENDENT_AMBULATORY_CARE_PROVIDER_SITE_OTHER): Payer: Medicare Other

## 2012-04-24 DIAGNOSIS — Z954 Presence of other heart-valve replacement: Secondary | ICD-10-CM

## 2012-04-24 DIAGNOSIS — I359 Nonrheumatic aortic valve disorder, unspecified: Secondary | ICD-10-CM

## 2012-04-24 DIAGNOSIS — Z7901 Long term (current) use of anticoagulants: Secondary | ICD-10-CM

## 2012-04-24 DIAGNOSIS — Z952 Presence of prosthetic heart valve: Secondary | ICD-10-CM

## 2012-05-08 ENCOUNTER — Ambulatory Visit (INDEPENDENT_AMBULATORY_CARE_PROVIDER_SITE_OTHER): Payer: Medicare Other

## 2012-05-08 DIAGNOSIS — Z952 Presence of prosthetic heart valve: Secondary | ICD-10-CM

## 2012-05-08 DIAGNOSIS — I359 Nonrheumatic aortic valve disorder, unspecified: Secondary | ICD-10-CM

## 2012-05-08 DIAGNOSIS — Z7901 Long term (current) use of anticoagulants: Secondary | ICD-10-CM

## 2012-05-08 DIAGNOSIS — Z954 Presence of other heart-valve replacement: Secondary | ICD-10-CM

## 2012-05-26 ENCOUNTER — Ambulatory Visit (INDEPENDENT_AMBULATORY_CARE_PROVIDER_SITE_OTHER): Payer: Medicare Other | Admitting: *Deleted

## 2012-05-26 DIAGNOSIS — Z954 Presence of other heart-valve replacement: Secondary | ICD-10-CM

## 2012-05-26 DIAGNOSIS — Z7901 Long term (current) use of anticoagulants: Secondary | ICD-10-CM

## 2012-05-26 DIAGNOSIS — Z952 Presence of prosthetic heart valve: Secondary | ICD-10-CM

## 2012-05-26 DIAGNOSIS — I359 Nonrheumatic aortic valve disorder, unspecified: Secondary | ICD-10-CM

## 2012-05-26 LAB — POCT INR: INR: 4.6

## 2012-06-08 ENCOUNTER — Ambulatory Visit (INDEPENDENT_AMBULATORY_CARE_PROVIDER_SITE_OTHER): Payer: Medicare Other | Admitting: *Deleted

## 2012-06-08 DIAGNOSIS — Z952 Presence of prosthetic heart valve: Secondary | ICD-10-CM

## 2012-06-08 DIAGNOSIS — I359 Nonrheumatic aortic valve disorder, unspecified: Secondary | ICD-10-CM

## 2012-06-08 DIAGNOSIS — Z7901 Long term (current) use of anticoagulants: Secondary | ICD-10-CM

## 2012-06-08 DIAGNOSIS — Z954 Presence of other heart-valve replacement: Secondary | ICD-10-CM

## 2012-06-29 ENCOUNTER — Telehealth: Payer: Self-pay | Admitting: Cardiology

## 2012-06-29 ENCOUNTER — Ambulatory Visit (INDEPENDENT_AMBULATORY_CARE_PROVIDER_SITE_OTHER): Payer: Medicare Other

## 2012-06-29 DIAGNOSIS — Z7901 Long term (current) use of anticoagulants: Secondary | ICD-10-CM

## 2012-06-29 DIAGNOSIS — Z952 Presence of prosthetic heart valve: Secondary | ICD-10-CM

## 2012-06-29 DIAGNOSIS — Z954 Presence of other heart-valve replacement: Secondary | ICD-10-CM

## 2012-06-29 DIAGNOSIS — I359 Nonrheumatic aortic valve disorder, unspecified: Secondary | ICD-10-CM

## 2012-06-29 LAB — POCT INR: INR: 2.8

## 2012-06-29 NOTE — Telephone Encounter (Signed)
Returned call to patient she stated she is scheduled for a colonoscopy 07/16/12 and wants to know if ok to hold coumadin.Message sent to Dr.Jordan.

## 2012-06-29 NOTE — Telephone Encounter (Signed)
Patient called was told ok with Dr.Jordan to hold coumadin 4 days prior to colonoscopy.Dr.Mann's office called left message with his nurse Lupita Leash and note faxed to her at fax # [phone].

## 2012-06-29 NOTE — Telephone Encounter (Signed)
New problem   Pt is having a colonoscopy 07/16/12 and a letter was sent from Dr [facility] office for a request if pt could go off her Coumadin. Please call pt or Dr Kenna Gilbert office concerning this matter.

## 2012-06-29 NOTE — Telephone Encounter (Signed)
She may stop coumadin for 4 days for colonoscopy.  Peter Swaz[place] MD, [facility]

## 2012-07-11 ENCOUNTER — Other Ambulatory Visit: Payer: Self-pay | Admitting: Cardiology

## 2012-07-13 ENCOUNTER — Other Ambulatory Visit: Payer: Self-pay | Admitting: *Deleted

## 2012-07-13 MED ORDER — WARFARIN SODIUM 5 MG PO TABS: ORAL_TABLET | ORAL | Status: DC

## 2012-07-21 ENCOUNTER — Ambulatory Visit (INDEPENDENT_AMBULATORY_CARE_PROVIDER_SITE_OTHER): Payer: Medicare Other | Admitting: Pharmacist

## 2012-07-21 DIAGNOSIS — Z954 Presence of other heart-valve replacement: Secondary | ICD-10-CM

## 2012-07-21 DIAGNOSIS — I359 Nonrheumatic aortic valve disorder, unspecified: Secondary | ICD-10-CM

## 2012-07-21 DIAGNOSIS — Z952 Presence of prosthetic heart valve: Secondary | ICD-10-CM

## 2012-07-21 DIAGNOSIS — Z7901 Long term (current) use of anticoagulants: Secondary | ICD-10-CM

## 2012-07-21 LAB — POCT INR: INR: 1.5

## 2012-08-04 ENCOUNTER — Ambulatory Visit (INDEPENDENT_AMBULATORY_CARE_PROVIDER_SITE_OTHER): Payer: Medicare Other

## 2012-08-04 DIAGNOSIS — Z952 Presence of prosthetic heart valve: Secondary | ICD-10-CM

## 2012-08-04 DIAGNOSIS — Z7901 Long term (current) use of anticoagulants: Secondary | ICD-10-CM

## 2012-08-04 DIAGNOSIS — Z954 Presence of other heart-valve replacement: Secondary | ICD-10-CM

## 2012-08-04 DIAGNOSIS — I359 Nonrheumatic aortic valve disorder, unspecified: Secondary | ICD-10-CM

## 2012-08-04 LAB — POCT INR: INR: 3.6

## 2012-08-25 ENCOUNTER — Ambulatory Visit (INDEPENDENT_AMBULATORY_CARE_PROVIDER_SITE_OTHER): Payer: Medicare Other | Admitting: *Deleted

## 2012-08-25 DIAGNOSIS — Z7901 Long term (current) use of anticoagulants: Secondary | ICD-10-CM

## 2012-08-25 DIAGNOSIS — Z954 Presence of other heart-valve replacement: Secondary | ICD-10-CM

## 2012-08-25 DIAGNOSIS — Z952 Presence of prosthetic heart valve: Secondary | ICD-10-CM

## 2012-08-25 DIAGNOSIS — I359 Nonrheumatic aortic valve disorder, unspecified: Secondary | ICD-10-CM

## 2012-09-15 ENCOUNTER — Ambulatory Visit (INDEPENDENT_AMBULATORY_CARE_PROVIDER_SITE_OTHER): Payer: Medicare Other | Admitting: *Deleted

## 2012-09-15 DIAGNOSIS — Z7901 Long term (current) use of anticoagulants: Secondary | ICD-10-CM

## 2012-09-15 DIAGNOSIS — I359 Nonrheumatic aortic valve disorder, unspecified: Secondary | ICD-10-CM

## 2012-09-15 DIAGNOSIS — Z954 Presence of other heart-valve replacement: Secondary | ICD-10-CM

## 2012-09-15 DIAGNOSIS — Z952 Presence of prosthetic heart valve: Secondary | ICD-10-CM

## 2012-09-15 LAB — POCT INR: INR: 2.4

## 2012-09-21 ENCOUNTER — Other Ambulatory Visit: Payer: Self-pay | Admitting: *Deleted

## 2012-09-21 MED ORDER — WARFARIN SODIUM 5 MG PO TABS: ORAL_TABLET | ORAL | Status: DC

## 2012-10-07 ENCOUNTER — Encounter: Payer: Self-pay | Admitting: Cardiology

## 2012-10-07 ENCOUNTER — Ambulatory Visit (INDEPENDENT_AMBULATORY_CARE_PROVIDER_SITE_OTHER): Payer: Medicare Other | Admitting: *Deleted

## 2012-10-07 ENCOUNTER — Ambulatory Visit (INDEPENDENT_AMBULATORY_CARE_PROVIDER_SITE_OTHER): Payer: Medicare Other | Admitting: Cardiology

## 2012-10-07 VITALS — BP 150/80 | HR 61 | Ht 66.5 in | Wt 160.0 lb

## 2012-10-07 DIAGNOSIS — E785 Hyperlipidemia, unspecified: Secondary | ICD-10-CM

## 2012-10-07 DIAGNOSIS — Z954 Presence of other heart-valve replacement: Secondary | ICD-10-CM

## 2012-10-07 DIAGNOSIS — Z952 Presence of prosthetic heart valve: Secondary | ICD-10-CM

## 2012-10-07 DIAGNOSIS — I359 Nonrheumatic aortic valve disorder, unspecified: Secondary | ICD-10-CM

## 2012-10-07 DIAGNOSIS — Z7901 Long term (current) use of anticoagulants: Secondary | ICD-10-CM

## 2012-10-07 DIAGNOSIS — Z951 Presence of aortocoronary bypass graft: Secondary | ICD-10-CM

## 2012-10-07 LAB — BASIC METABOLIC PANEL
Calcium: 9.4 mg/dL (ref 8.4–10.5)
GFR: 86.52 mL/min (ref >60.00)
Glucose, Bld: 88 mg/dL (ref 70–99)
Potassium: 5 mEq/L (ref 3.5–5.1)
Sodium: 139 mEq/L (ref 135–145)

## 2012-10-07 LAB — HEPATIC FUNCTION PANEL
ALT: 21 U/L (ref 0–35)
Albumin: 4.2 g/dL (ref 3.5–5.2)
Bilirubin, Direct: 0 mg/dL (ref 0.0–0.3)
Total Protein: 7.1 g/dL (ref 6.0–8.3)

## 2012-10-07 LAB — LIPID PANEL
Cholesterol: 204 mg/dL — ABNORMAL HIGH (ref 0–200)
HDL: 44 mg/dL (ref >39.00)

## 2012-10-07 LAB — POCT INR: INR: 2.6

## 2012-10-07 NOTE — Patient Instructions (Signed)
We will check fasting lab work today. We may want to try [facility] but lets get your lab work first.  Stop Zetia.  Continue coumadin

## 2012-10-07 NOTE — Progress Notes (Signed)
[name] Date of Birth: 12-22-1940   History of Present Illness: Tamara Patterson is seen today for followup. 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 This included mechanical aortic valve replacement and grafting of the aortic root with a #23 mm St. Jude valve conduit. She also had coronary bypass surgery including an IMA graft to the LAD, saphenous vein graft to the right coronary, and saphenous vein graft to the first and second obtuse marginal vessels. 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 She was treated with pulse steroids for a period of time and was weaned off of this. She has done very well since then.  On followup today she states that she stop taking Zetia  because of joint aches and pains. She had similar problems with statins. She denies any chest pain or shortness of breath. She's had no palpitations.  Current Outpatient Prescriptions on File Prior to Visit  Medication Sig Dispense Refill  . CALCIUM-MAGNESIUM-VITAMIN D PO Take by mouth.        . citalopram (CELEXA) 20 MG tablet Take 20 mg by mouth daily.        . Multiple Vitamin (MULTI-VITAMIN PO) Take by mouth.        . warfarin (COUMADIN) 5 MG tablet Take as directed by coumadin clinic  120 tablet  1   No current facility-administered medications on file prior to visit.    Allergies  Allergen Reactions  . Atenolol   . Codeine   . Lipitor (Atorvastatin Calcium) Other (See Comments)    Caused muscle aching    Past Medical History  Diagnosis Date  . S/P AVR (aortic valve replacement)     with root grafting; #12mm  St. Jude valve conduit; Last Echo 2007  . Hx of CABG 1991    LIMA-LAD, SVG-RCA, SVG-1st and 2ndOM  . Dyslipidemia   . HTN (hypertension)   . Anticoagulant long-term use   . Panic anxiety syndrome   . Giant cell arteritis     per aortotomy biopsy in 1991  . CAD (coronary artery disease)   . GERD (gastroesophageal  reflux disease)     Past Surgical History  Procedure Laterality Date  . Coronary artery bypass graft    . Aortic valve replacement      with root grafting  . Cholecystectomy      History  Smoking status  . Former Smoker  Smokeless tobacco  . Not on file    History  Alcohol Use No    Family History  Problem Relation Age of Onset  . Liver disease Mother   . Pancreatic cancer Father     Review of Systems: As noted in history of present illness. All other systems were reviewed and are negative.  Physical Exam: BP 150/80  Pulse 61  Ht 5' 6.5" (1.689 m)  Wt 160 lb (72.576 kg)  BMI 25.44 kg/m2 The patient is alert and oriented x 3.   The skin is warm and dry. The HEENT exam is normal.  The carotids are 2+ without bruits.  There is no thyromegaly.  There is no JVD.  The lungs are clear.    The heart exam reveals a regular rate with a normal Mechanical aortic valve click.  There are no murmurs, gallops, or rubs.  The PMI is not displaced.   Abdominal exam reveals good bowel sounds.  There is no guarding or rebound.  There is  no hepatosplenomegaly or tenderness.  There are no masses.  Exam of the legs reveal no clubbing, cyanosis, or edema.  The legs are without rashes.  The distal pulses are intact.  Cranial nerves II - XII are intact.  Motor and sensory functions are intact.  The gait is normal.  LABORATORY DATA: INR today was 2.6 ECG today demonstrates normal sinus rhythm with a rate of 61 beats per minute. 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 A right bundle branch block is new compared to August of 2013.  Assessment / Plan: 1. Status post mechanical aortic valve replacement. She is therapeutic on her Coumadin. Exam is stable.  2. Status post CABG for ostial coronary disease.  3. Hyperlipidemia. We will check fasting lab work today on no medication. We may consider a trial of WelChol if her cholesterol is not at goal.  4. New right bundle  branch block. She is asymptomatic.

## 2012-10-09 ENCOUNTER — Telehealth: Payer: Self-pay | Admitting: Cardiology

## 2012-10-09 MED ORDER — COLESEVELAM HCL 3.75 G PO PACK: PACK | ORAL | Status: DC

## 2012-10-09 NOTE — Telephone Encounter (Signed)
Returned call to patient lab results given.Dr.Jordan advised cholesterol mildly elevated.He advised start welchol 1 pack in liquid daily.

## 2012-10-09 NOTE — Telephone Encounter (Signed)
Follow up  Pt is returning your call regarding lab work.

## 2012-10-21 ENCOUNTER — Encounter: Payer: Self-pay | Admitting: Cardiology

## 2012-10-22 ENCOUNTER — Encounter: Payer: Self-pay | Admitting: Cardiology

## 2012-11-04 ENCOUNTER — Ambulatory Visit (INDEPENDENT_AMBULATORY_CARE_PROVIDER_SITE_OTHER): Payer: Medicare Other | Admitting: *Deleted

## 2012-11-04 DIAGNOSIS — Z7901 Long term (current) use of anticoagulants: Secondary | ICD-10-CM

## 2012-11-04 DIAGNOSIS — I359 Nonrheumatic aortic valve disorder, unspecified: Secondary | ICD-10-CM

## 2012-11-04 DIAGNOSIS — Z954 Presence of other heart-valve replacement: Secondary | ICD-10-CM

## 2012-11-04 DIAGNOSIS — Z952 Presence of prosthetic heart valve: Secondary | ICD-10-CM

## 2012-12-03 ENCOUNTER — Ambulatory Visit (INDEPENDENT_AMBULATORY_CARE_PROVIDER_SITE_OTHER): Payer: Medicare Other | Admitting: *Deleted

## 2012-12-03 DIAGNOSIS — Z952 Presence of prosthetic heart valve: Secondary | ICD-10-CM

## 2012-12-03 DIAGNOSIS — I359 Nonrheumatic aortic valve disorder, unspecified: Secondary | ICD-10-CM

## 2012-12-03 DIAGNOSIS — Z7901 Long term (current) use of anticoagulants: Secondary | ICD-10-CM

## 2012-12-03 DIAGNOSIS — Z954 Presence of other heart-valve replacement: Secondary | ICD-10-CM

## 2012-12-03 LAB — POCT INR: INR: 2.4

## 2012-12-08 ENCOUNTER — Telehealth: Payer: Self-pay | Admitting: Cardiology

## 2012-12-08 NOTE — Telephone Encounter (Signed)
New problem   Pt is needing tooth extraction and is taking coumadin. Dr Alveta Heimlich need to know if it's ok for pt to get tooth extracted. Please advise.

## 2012-12-08 NOTE — Telephone Encounter (Signed)
Returned call to Dr.Gill's office patient needs 1 tooth extracted and Dr.Gill needs to know if patient needs to hold coumadin.Message sent to Dr.Jordan for advice.

## 2012-12-08 NOTE — Telephone Encounter (Signed)
OK to have tooth extracted while on coumadin. She does need SBE prophylaxis.  Laiken Nohr [place] MD, [facility]

## 2012-12-08 NOTE — Telephone Encounter (Signed)
Returned call to Dr.Gill, Dr.Jordan advised ok to have tooth extracted while on coumadin but patient needs SBE prophylaxis.Dr.Gill stated she has a standing order for amoxicillin 1 hour before dental work and she will take before extraction.

## 2012-12-13 ENCOUNTER — Emergency Department (HOSPITAL_COMMUNITY): Payer: Medicare Other

## 2012-12-13 ENCOUNTER — Emergency Department (HOSPITAL_COMMUNITY)
Admission: EM | Admit: 2012-12-13 | Discharge: 2012-12-13 | Disposition: A | Discharge status: Home / Self Care | Payer: Medicare Other | Attending: Emergency Medicine | Admitting: Emergency Medicine

## 2012-12-13 ENCOUNTER — Encounter (HOSPITAL_COMMUNITY): Payer: Self-pay | Admitting: Emergency Medicine

## 2012-12-13 DIAGNOSIS — I10 Essential (primary) hypertension: Secondary | ICD-10-CM | POA: Insufficient documentation

## 2012-12-13 DIAGNOSIS — R197 Diarrhea, unspecified: Secondary | ICD-10-CM | POA: Insufficient documentation

## 2012-12-13 DIAGNOSIS — E860 Dehydration: Secondary | ICD-10-CM | POA: Insufficient documentation

## 2012-12-13 DIAGNOSIS — R1110 Vomiting, unspecified: Secondary | ICD-10-CM

## 2012-12-13 DIAGNOSIS — Z951 Presence of aortocoronary bypass graft: Secondary | ICD-10-CM | POA: Insufficient documentation

## 2012-12-13 DIAGNOSIS — Z7901 Long term (current) use of anticoagulants: Secondary | ICD-10-CM | POA: Insufficient documentation

## 2012-12-13 DIAGNOSIS — F410 Panic disorder [episodic paroxysmal anxiety] without agoraphobia: Secondary | ICD-10-CM | POA: Insufficient documentation

## 2012-12-13 DIAGNOSIS — E785 Hyperlipidemia, unspecified: Secondary | ICD-10-CM | POA: Insufficient documentation

## 2012-12-13 DIAGNOSIS — I2510 Atherosclerotic heart disease of native coronary artery without angina pectoris: Secondary | ICD-10-CM | POA: Insufficient documentation

## 2012-12-13 DIAGNOSIS — Z8719 Personal history of other diseases of the digestive system: Secondary | ICD-10-CM | POA: Insufficient documentation

## 2012-12-13 DIAGNOSIS — R1013 Epigastric pain: Secondary | ICD-10-CM | POA: Insufficient documentation

## 2012-12-13 DIAGNOSIS — Z87891 Personal history of nicotine dependence: Secondary | ICD-10-CM | POA: Insufficient documentation

## 2012-12-13 LAB — COMPREHENSIVE METABOLIC PANEL
Albumin: 4 g/dL (ref 3.5–5.2)
BUN: 14 mg/dL (ref 6–23)
CO2: 27 mEq/L (ref 19–32)
Chloride: 106 mEq/L (ref 96–112)
Creatinine, Ser: 0.81 mg/dL (ref 0.50–1.10)
GFR calc Af Amer: 82 mL/min — ABNORMAL LOW (ref >90)
GFR calc non Af Amer: 70 mL/min — ABNORMAL LOW (ref >90)
Potassium: 3.4 mEq/L — ABNORMAL LOW (ref 3.5–5.1)
Total Bilirubin: 1 mg/dL (ref 0.3–1.2)

## 2012-12-13 LAB — URINALYSIS, ROUTINE W REFLEX MICROSCOPIC
Ketones, ur: NEGATIVE mg/dL
Leukocytes, UA: NEGATIVE
Nitrite: NEGATIVE
Protein, ur: NEGATIVE mg/dL
Urobilinogen, UA: 0.2 mg/dL (ref 0.0–1.0)

## 2012-12-13 LAB — CBC WITH DIFFERENTIAL/PLATELET
Basophils Relative: 0 % (ref 0–1)
HCT: 40.1 % (ref 36.0–46.0)
Hemoglobin: 14.1 g/dL (ref 12.0–15.0)
MCHC: 35.2 g/dL (ref 30.0–36.0)
Monocytes Absolute: 0.5 10*3/uL (ref 0.1–1.0)
Monocytes Relative: 6 % (ref 3–12)
Neutro Abs: 6.2 10*3/uL (ref 1.7–7.7)
RDW: 13.3 % (ref 11.5–15.5)

## 2012-12-13 LAB — LIPASE, BLOOD: Lipase: 49 U/L (ref 11–59)

## 2012-12-13 MED ORDER — ONDANSETRON HCL 4 MG/2ML IJ SOLN
4.0000 mg | Freq: Once | INTRAMUSCULAR | Status: AC
  Administered 2012-12-13: 4 mg via INTRAVENOUS
  Filled 2012-12-13: qty 2

## 2012-12-13 MED ORDER — SODIUM CHLORIDE 0.9 % IV BOLUS (SEPSIS)
1000.0000 mL | Freq: Once | INTRAVENOUS | Status: AC
  Administered 2012-12-13: 1000 mL via INTRAVENOUS

## 2012-12-13 MED ORDER — ONDANSETRON HCL 4 MG PO TABS: 4.0000 mg | ORAL_TABLET | Freq: Four times a day (QID) | ORAL | Status: DC

## 2012-12-13 NOTE — ED Notes (Signed)
Pt. reports mid abdominal pain with emesis and diarrhea onset 1 pm yesterday .

## 2012-12-13 NOTE — ED Provider Notes (Signed)
CSN: [id number]     Arrival date & time 12/13/12  0213 History   First MD Initiated Contact with Patient 12/13/12 [phone]     Chief Complaint  Patient presents with  . Abdominal Pain  . Emesis  . Diarrhea   (Consider location/radiation/quality/duration/timing/severity/associated sxs/prior Treatment) The history is provided by the patient.  Tamara Patterson is a 72 y.o. female history of aortic valve replacement on Coumadin, CAD here with abdominal pain and vomiting and diarrhea. She reported that she started having epigastric pain and vomiting after eating lunch around 1 PM yesterday. She felt dehydrated unable to keep anything down. Denies any fevers or chills or chest pain or shortness of breath. Had previous cholecystectomy and hernia repair previously but no hx of SBO.    Past Medical History  Diagnosis Date  . S/P AVR (aortic valve replacement)     with root grafting; #39mm  St. Jude valve conduit; Last Echo 2007  . Hx of CABG 1991    LIMA-LAD, SVG-RCA, SVG-1st and 2ndOM  . Dyslipidemia   . HTN (hypertension)   . Anticoagulant long-term use   . Panic anxiety syndrome   . Giant cell arteritis     per aortotomy biopsy in 1991  . CAD (coronary artery disease)   . GERD (gastroesophageal reflux disease)    Past Surgical History  Procedure Laterality Date  . Coronary artery bypass graft    . Aortic valve replacement      with root grafting  . Cholecystectomy     Family History  Problem Relation Age of Onset  . Liver disease Mother   . Pancreatic cancer Father    History  Substance Use Topics  . Smoking status: Former Games developer  . Smokeless tobacco: Not on file  . Alcohol Use: No   OB History   Grav Para Term Preterm Abortions TAB SAB Ect Mult Living                 Review of Systems  Gastrointestinal: Positive for vomiting, abdominal pain and diarrhea.  All other systems reviewed and are negative.    Allergies  Atenolol; Codeine; and Lipitor  Home Medications    Current Outpatient Rx  Name  Route  Sig  Dispense  Refill  . citalopram (CELEXA) 20 MG tablet   Oral   Take 20 mg by mouth daily.          . Colesevelam HCl 3.75 G PACK   Oral   Take 3.75 g by mouth daily. Take 1 packet in liquid daily         . warfarin (COUMADIN) 5 MG tablet   Oral   Take 5-7.5 mg by mouth See admin instructions. Takes 1.5 tablets (7.5mg ) Monday and Friday. Takes 1 tablet (5mg ) all other days          BP 150/66  Pulse 95  Temp(Src) 98.1 F (36.7 C)  Resp 18  SpO2 98% Physical Exam  Nursing note and vitals reviewed. Constitutional: She appears well-developed and well-nourished.  HENT:  Head: Normocephalic.  MM slightly dry   Eyes: Conjunctivae are normal. Pupils are equal, round, and reactive to light.  Neck: Normal range of motion. Neck supple.  Cardiovascular: Normal rate, regular rhythm and normal heart sounds.   Pulmonary/Chest: Effort normal and breath sounds normal. No respiratory distress. She has no wheezes. She has no rales. She exhibits no tenderness.  Abdominal: Soft. Bowel sounds are normal.    ED Course  Procedures (including critical  care time) Labs Review Labs Reviewed  CBC WITH DIFFERENTIAL - Abnormal; Notable for the following:    Neutrophils Relative % 80 (*)    All other components within normal limits  COMPREHENSIVE METABOLIC PANEL - Abnormal; Notable for the following:    Potassium 3.4 (*)    Glucose, Bld 140 (*)    GFR calc non Af Amer 70 (*)    GFR calc Af Amer 82 (*)    All other components within normal limits  PROTIME-INR - Abnormal; Notable for the following:    Prothrombin Time 31.7 (*)    INR 3.21 (*)    All other components within normal limits  LIPASE, BLOOD  URINALYSIS, ROUTINE W REFLEX MICROSCOPIC  POCT I-STAT TROPONIN I   Imaging Review Dg Abd Acute W/chest  12/13/2012   CLINICAL DATA:  Upper abdominal pain. Nausea and vomiting. Diarrhea. CABG.  EXAM: ACUTE ABDOMEN SERIES (ABDOMEN 2 VIEW & CHEST 1  VIEW)  COMPARISON:  11/15/2011 chest film.  FINDINGS: Frontal view of the chest demonstrates midline trachea. Normal heart size. No pleural effusion or pneumothorax. Prior median sternotomy. Upper lobe predominant architectural distortion and volume loss is not significantly changed. There is also mild scarring at the left lung base. No lobar consolidation.  Abdominal films demonstrate cholecystectomy clips. No free intraperitoneal air. Minimal small bowel air-fluid levels within the mid abdomen. No bowel distension on supine imaging. Distal gas.  IMPRESSION: Nonspecific mild small bowel air-fluid levels, without evidence of obstruction or free intraperitoneal air.  Cardiomegaly with similar appearance of upper lobe predominant architectural distortion and volume loss. Considerations include this sequelae of prior atypical infection, including tuberculosis, and chronic sarcoidosis.   Electronically Signed   By: Jeronimo Greaves   On: 12/13/2012 04:33    Date: 12/13/2012  Rate: 100  Rhythm: normal sinus rhythm  QRS Axis: normal  Intervals: normal  ST/T Wave abnormalities: nonspecific ST changes  Conduction Disutrbances:bifasicular block  Narrative Interpretation:   Old EKG Reviewed: unchanged   MDM  No diagnosis found. Tamara Patterson is a 72 y.o. female here with ab pain, vomiting, diarrhea. Labs unremarkable. xrays showed no SBo. I think she likely has viral gastro. Hydrated in the ED. Felt better. Will d/c home on prn zofran.      Richardean Canal, MD 12/13/12 [phone]

## 2012-12-31 ENCOUNTER — Ambulatory Visit (INDEPENDENT_AMBULATORY_CARE_PROVIDER_SITE_OTHER): Payer: Medicare Other | Admitting: General Practice

## 2012-12-31 DIAGNOSIS — Z7901 Long term (current) use of anticoagulants: Secondary | ICD-10-CM

## 2012-12-31 DIAGNOSIS — I359 Nonrheumatic aortic valve disorder, unspecified: Secondary | ICD-10-CM

## 2012-12-31 DIAGNOSIS — Z954 Presence of other heart-valve replacement: Secondary | ICD-10-CM

## 2012-12-31 DIAGNOSIS — Z952 Presence of prosthetic heart valve: Secondary | ICD-10-CM

## 2013-01-18 ENCOUNTER — Ambulatory Visit (INDEPENDENT_AMBULATORY_CARE_PROVIDER_SITE_OTHER): Payer: Medicare Other | Admitting: General Practice

## 2013-01-18 DIAGNOSIS — Z952 Presence of prosthetic heart valve: Secondary | ICD-10-CM

## 2013-01-18 DIAGNOSIS — I359 Nonrheumatic aortic valve disorder, unspecified: Secondary | ICD-10-CM

## 2013-01-18 DIAGNOSIS — Z954 Presence of other heart-valve replacement: Secondary | ICD-10-CM

## 2013-01-18 DIAGNOSIS — Z7901 Long term (current) use of anticoagulants: Secondary | ICD-10-CM

## 2013-01-22 ENCOUNTER — Other Ambulatory Visit: Payer: Self-pay | Admitting: Cardiology

## 2013-02-15 ENCOUNTER — Ambulatory Visit (INDEPENDENT_AMBULATORY_CARE_PROVIDER_SITE_OTHER): Payer: Medicare Other | Admitting: General Practice

## 2013-02-15 DIAGNOSIS — Z954 Presence of other heart-valve replacement: Secondary | ICD-10-CM

## 2013-02-15 DIAGNOSIS — I359 Nonrheumatic aortic valve disorder, unspecified: Secondary | ICD-10-CM

## 2013-02-15 DIAGNOSIS — Z952 Presence of prosthetic heart valve: Secondary | ICD-10-CM

## 2013-02-15 DIAGNOSIS — Z7901 Long term (current) use of anticoagulants: Secondary | ICD-10-CM

## 2013-02-15 LAB — POCT INR: INR: 2.1

## 2013-03-09 ENCOUNTER — Ambulatory Visit (INDEPENDENT_AMBULATORY_CARE_PROVIDER_SITE_OTHER): Payer: Medicare Other | Admitting: *Deleted

## 2013-03-09 DIAGNOSIS — Z952 Presence of prosthetic heart valve: Secondary | ICD-10-CM

## 2013-03-09 DIAGNOSIS — Z954 Presence of other heart-valve replacement: Secondary | ICD-10-CM

## 2013-03-09 DIAGNOSIS — Z7901 Long term (current) use of anticoagulants: Secondary | ICD-10-CM

## 2013-03-09 DIAGNOSIS — I359 Nonrheumatic aortic valve disorder, unspecified: Secondary | ICD-10-CM

## 2013-03-09 LAB — POCT INR: INR: 2.9

## 2013-03-23 ENCOUNTER — Telehealth: Payer: Self-pay | Admitting: Cardiology

## 2013-03-23 NOTE — Telephone Encounter (Signed)
Patient notified that mucinex twice daily with full glass of water would be okay.  She was advised to stay away from Mucinex-D given it has a decongestant.

## 2013-03-23 NOTE — Telephone Encounter (Signed)
New Message  Pt request a call back to discuss an If it is OK to take the over the counter medication Mucinex// Please call back and discuss

## 2013-04-01 ENCOUNTER — Ambulatory Visit (INDEPENDENT_AMBULATORY_CARE_PROVIDER_SITE_OTHER): Payer: Medicare Other

## 2013-04-01 DIAGNOSIS — Z952 Presence of prosthetic heart valve: Secondary | ICD-10-CM

## 2013-04-01 DIAGNOSIS — Z954 Presence of other heart-valve replacement: Secondary | ICD-10-CM

## 2013-04-01 DIAGNOSIS — Z7901 Long term (current) use of anticoagulants: Secondary | ICD-10-CM

## 2013-04-01 DIAGNOSIS — I359 Nonrheumatic aortic valve disorder, unspecified: Secondary | ICD-10-CM

## 2013-04-01 LAB — POCT INR: INR: 2.7

## 2013-04-13 ENCOUNTER — Encounter (HOSPITAL_COMMUNITY): Payer: Self-pay | Admitting: Emergency Medicine

## 2013-04-13 ENCOUNTER — Emergency Department (HOSPITAL_COMMUNITY)
Admission: EM | Admit: 2013-04-13 | Discharge: 2013-04-13 | Disposition: A | Discharge status: Home / Self Care | Payer: Medicare Other | Attending: Emergency Medicine | Admitting: Emergency Medicine

## 2013-04-13 ENCOUNTER — Emergency Department (HOSPITAL_COMMUNITY): Payer: Medicare Other

## 2013-04-13 DIAGNOSIS — R0789 Other chest pain: Secondary | ICD-10-CM | POA: Insufficient documentation

## 2013-04-13 DIAGNOSIS — R55 Syncope and collapse: Secondary | ICD-10-CM | POA: Insufficient documentation

## 2013-04-13 DIAGNOSIS — J069 Acute upper respiratory infection, unspecified: Secondary | ICD-10-CM | POA: Insufficient documentation

## 2013-04-13 DIAGNOSIS — I2510 Atherosclerotic heart disease of native coronary artery without angina pectoris: Secondary | ICD-10-CM | POA: Insufficient documentation

## 2013-04-13 DIAGNOSIS — R42 Dizziness and giddiness: Secondary | ICD-10-CM | POA: Insufficient documentation

## 2013-04-13 DIAGNOSIS — Z954 Presence of other heart-valve replacement: Secondary | ICD-10-CM | POA: Insufficient documentation

## 2013-04-13 DIAGNOSIS — R531 Weakness: Secondary | ICD-10-CM

## 2013-04-13 DIAGNOSIS — Z87891 Personal history of nicotine dependence: Secondary | ICD-10-CM | POA: Insufficient documentation

## 2013-04-13 DIAGNOSIS — R079 Chest pain, unspecified: Secondary | ICD-10-CM

## 2013-04-13 DIAGNOSIS — I10 Essential (primary) hypertension: Secondary | ICD-10-CM | POA: Insufficient documentation

## 2013-04-13 DIAGNOSIS — Z862 Personal history of diseases of the blood and blood-forming organs and certain disorders involving the immune mechanism: Secondary | ICD-10-CM | POA: Insufficient documentation

## 2013-04-13 DIAGNOSIS — J399 Disease of upper respiratory tract, unspecified: Secondary | ICD-10-CM

## 2013-04-13 DIAGNOSIS — R5383 Other fatigue: Secondary | ICD-10-CM

## 2013-04-13 DIAGNOSIS — Z7901 Long term (current) use of anticoagulants: Secondary | ICD-10-CM | POA: Insufficient documentation

## 2013-04-13 DIAGNOSIS — R5381 Other malaise: Secondary | ICD-10-CM | POA: Insufficient documentation

## 2013-04-13 DIAGNOSIS — Z79899 Other long term (current) drug therapy: Secondary | ICD-10-CM | POA: Insufficient documentation

## 2013-04-13 DIAGNOSIS — Z8639 Personal history of other endocrine, nutritional and metabolic disease: Secondary | ICD-10-CM | POA: Insufficient documentation

## 2013-04-13 DIAGNOSIS — F410 Panic disorder [episodic paroxysmal anxiety] without agoraphobia: Secondary | ICD-10-CM | POA: Insufficient documentation

## 2013-04-13 DIAGNOSIS — R011 Cardiac murmur, unspecified: Secondary | ICD-10-CM | POA: Insufficient documentation

## 2013-04-13 DIAGNOSIS — Z8719 Personal history of other diseases of the digestive system: Secondary | ICD-10-CM | POA: Insufficient documentation

## 2013-04-13 DIAGNOSIS — Z951 Presence of aortocoronary bypass graft: Secondary | ICD-10-CM | POA: Insufficient documentation

## 2013-04-13 LAB — PROTIME-INR
INR: 3.52 — ABNORMAL HIGH (ref 0.00–1.49)
Prothrombin Time: 34 seconds — ABNORMAL HIGH (ref 11.6–15.2)

## 2013-04-13 LAB — CBC WITH DIFFERENTIAL/PLATELET
Basophils Absolute: 0 10*3/uL (ref 0.0–0.1)
Basophils Relative: 0 % (ref 0–1)
EOS PCT: 3 % (ref 0–5)
Eosinophils Absolute: 0.2 10*3/uL (ref 0.0–0.7)
HEMATOCRIT: 38.5 % (ref 36.0–46.0)
HEMOGLOBIN: 13.2 g/dL (ref 12.0–15.0)
Lymphocytes Relative: 53 % — ABNORMAL HIGH (ref 12–46)
Lymphs Abs: 3 10*3/uL (ref 0.7–4.0)
MCH: 27.8 pg (ref 26.0–34.0)
MCHC: 34.3 g/dL (ref 30.0–36.0)
MCV: 81.2 fL (ref 78.0–100.0)
MONOS PCT: 13 % — AB (ref 3–12)
Monocytes Absolute: 0.7 10*3/uL (ref 0.1–1.0)
NEUTROS ABS: 1.7 10*3/uL (ref 1.7–7.7)
Neutrophils Relative %: 30 % — ABNORMAL LOW (ref 43–77)
Platelets: 228 10*3/uL (ref 150–400)
RBC: 4.74 MIL/uL (ref 3.87–5.11)
RDW: 13.7 % (ref 11.5–15.5)
WBC: 5.6 10*3/uL (ref 4.0–10.5)

## 2013-04-13 LAB — BASIC METABOLIC PANEL
BUN: 14 mg/dL (ref 6–23)
CALCIUM: 9.1 mg/dL (ref 8.4–10.5)
CHLORIDE: 106 meq/L (ref 96–112)
CO2: 26 meq/L (ref 19–32)
CREATININE: 0.85 mg/dL (ref 0.50–1.10)
GFR calc Af Amer: 76 mL/min — ABNORMAL LOW (ref >90)
GFR calc non Af Amer: 66 mL/min — ABNORMAL LOW (ref >90)
Glucose, Bld: 86 mg/dL (ref 70–99)
Potassium: 4.2 mEq/L (ref 3.7–5.3)
Sodium: 144 mEq/L (ref 137–147)

## 2013-04-13 LAB — TROPONIN I: Troponin I: <0.3 ng/mL (ref <0.30)

## 2013-04-13 LAB — PRO B NATRIURETIC PEPTIDE: Pro B Natriuretic peptide (BNP): 182.6 pg/mL — ABNORMAL HIGH (ref 0–125)

## 2013-04-13 MED ORDER — IBUPROFEN 200 MG PO TABS
400.0000 mg | ORAL_TABLET | Freq: Once | ORAL | Status: DC
  Filled 2013-04-13: qty 2

## 2013-04-13 MED ORDER — SODIUM CHLORIDE 0.9 % IV BOLUS (SEPSIS)
1000.0000 mL | Freq: Once | INTRAVENOUS | Status: AC
  Administered 2013-04-13: 1000 mL via INTRAVENOUS

## 2013-04-13 NOTE — ED Provider Notes (Signed)
CSN: [id number]     Arrival date & time 04/13/13  1021 History   First MD Initiated Contact with Patient 04/13/13 1040     Chief Complaint  Patient presents with  . Chest Pain   (Consider location/radiation/quality/duration/timing/severity/associated sxs/prior Treatment) HPI  This a 45 are old female who with a history of Thoracic dissection status post CABG, aortic valve replacement who presents with chest congestion and chest pain. Patient reports that she had a "cold several weeks ago." Since that time she reports generalized weakness and dizziness upon standing. She states "sometimes I feel like I may faint." Reports decreased PO intake and generalized fatigue.  Over the last 1 to 2 days she's had a dull aching of the chest that radiates to the back. THis is worsened with movement of the upper extremities and cough.  Cough is non productive.  She's currently pain-free. She denies any fevers or shortness of breath. The chest pain is not worse with exertion.   Past Medical History  Diagnosis Date  . S/P AVR (aortic valve replacement)     with root grafting; #63mm  [employer] valve conduit; Last Echo 2007  . Hx of CABG 1991    LIMA-LAD, SVG-RCA, SVG-1st and 2ndOM  . Dyslipidemia   . HTN (hypertension)   . Anticoagulant long-term use   . Panic anxiety syndrome   . Giant cell arteritis     per aortotomy biopsy in 1991  . CAD (coronary artery disease)   . GERD (gastroesophageal reflux disease)    Past Surgical History  Procedure Laterality Date  . Coronary artery bypass graft    . Aortic valve replacement      with root grafting  . Cholecystectomy     Family History  Problem Relation Age of Onset  . Liver disease Mother   . Pancreatic cancer Father    History  Substance Use Topics  . Smoking status: Former Games developer  . Smokeless tobacco: Not on file  . Alcohol Use: No   OB History   Grav Para Term Preterm Abortions TAB SAB Ect Mult Living                 Review of Systems   Constitutional: Negative for fever.  Respiratory: Positive for chest tightness. Negative for cough and shortness of breath.   Cardiovascular: Positive for chest pain.  Gastrointestinal: Negative for nausea, vomiting and abdominal pain.  Genitourinary: Negative for dysuria.  Musculoskeletal: Negative for back pain.  Skin: Negative for rash.  Neurological: Positive for dizziness, weakness and light-headedness. Negative for syncope and headaches.  Psychiatric/Behavioral: Negative for confusion.  All other systems reviewed and are negative.    Allergies  Atenolol; Codeine; and Lipitor  Home Medications   Current Outpatient Rx  Name  Route  Sig  Dispense  Refill  . cholecalciferol (VITAMIN D) 1000 UNITS tablet   Oral   Take 2,000 Units by mouth daily.         . citalopram (CELEXA) 20 MG tablet   Oral   Take 20 mg by mouth daily.          . clobetasol cream (TEMOVATE) 0.05 %   Topical   Apply 1 application topically 2 (two) times daily as needed (itchy skin). Apply to area(s) on leg as needed for itchy skin         . Colesevelam HCl 3.75 G PACK   Oral   Take 3.75 g by mouth daily. Take 1 packet in liquid daily         .  cycloSPORINE (RESTASIS) 0.05 % ophthalmic emulsion   Both Eyes   Place 1 drop into both eyes 2 (two) times daily.         Marland Kitchen estradiol (ESTRACE) 0.1 MG/GM vaginal cream   Vaginal   Place 1 Applicatorful vaginally 3 (three) times a week.         Marland Kitchen ibuprofen (ADVIL,MOTRIN) 200 MG tablet   Oral   Take 400 mg by mouth every 6 (six) hours as needed (pain).         Marland Kitchen latanoprost (XALATAN) 0.005 % ophthalmic solution   Both Eyes   Place 1 drop into both eyes daily as needed (itchy or scartchy eyes).         Marland Kitchen loteprednol (LOTEMAX) 0.5 % ophthalmic suspension   Both Eyes   Place 1 drop into both eyes daily as needed (irritation).         . potassium gluconate 595 MG TABS tablet   Oral   Take 1,190 mg by mouth daily.         Marland Kitchen warfarin  (COUMADIN) 5 MG tablet   Oral   Take 5-7.5 mg by mouth daily. Take 7.5mg  on M,W, and F and 5mg  on all other days          BP 143/61  Pulse 58  Temp(Src) 97.5 F (36.4 C) (Oral)  Resp 13  Wt 156 lb (70.761 kg)  SpO2 99% Physical Exam  Nursing note and vitals reviewed. Constitutional: She is oriented to person, place, and time. She appears well-developed and well-nourished. No distress.  HENT:  Head: Normocephalic and atraumatic.  Eyes: Pupils are equal, round, and reactive to light.  Neck: Neck supple.  Cardiovascular: Normal rate and regular rhythm.   Murmur [place]antiago. Pulmonary/Chest: Effort normal and breath sounds normal. No respiratory distress. She has no wheezes.  Midline well healed sternotomy  Abdominal: Soft. Bowel sounds are normal. There is no tenderness. There is no rebound.  Musculoskeletal: She exhibits no edema.  Neurological: She is alert and oriented to person, place, and time.  Skin: Skin is warm and dry.  Psychiatric: She has a normal mood and affect.    ED Course  Procedures (including critical care time) Labs Review Labs Reviewed  CBC WITH DIFFERENTIAL - Abnormal; Notable for the following:    Neutrophils Relative % 30 (*)    Lymphocytes Relative 53 (*)    Monocytes Relative 13 (*)    All other components within normal limits  BASIC METABOLIC PANEL - Abnormal; Notable for the following:    GFR calc non Af Amer 66 (*)    GFR calc Af Amer 76 (*)    All other components within normal limits  PRO B NATRIURETIC PEPTIDE - Abnormal; Notable for the following:    Pro B Natriuretic peptide (BNP) 182.6 (*)    All other components within normal limits  PROTIME-INR - Abnormal; Notable for the following:    Prothrombin Time 34.0 (*)    INR 3.52 (*)    All other components within normal limits  TROPONIN I   Imaging Review Dg Chest 2 View  04/13/2013   CLINICAL DATA:  Chest discomfort and fatigue.  EXAM: CHEST  2 VIEW  COMPARISON:  Plain film of the chest  11/14/2012 and 12/14/2011.  FINDINGS: Right upper lobe scarring and volume loss do not appear changed. The patient is status post CABG. No new airspace disease or effusion. There is no pulmonary edema. No focal bony abnormality is identified.  IMPRESSION:  No acute finding.  Bilateral upper lobe scarring and volume loss could be due to prior atypical infection including tuberculosis or sarcoidosis. The appearance is unchanged since the most recent study.   Electronically Signed   By: Drusilla Kannerhomas  Dalessio M.D.   On: 04/13/2013 11:29    EKG Interpretation    Date/Time:  Tuesday April 13 2013 10:37:30 EST Ventricular Rate:  70 PR Interval:  174 QRS Duration: 152 QT Interval:  456 QTC Calculation: 492 R Axis:   -61 Text Interpretation:  Sinus rhythm RBBB and LAFB Probable left ventricular hypertrophy Confirmed by HORTON  MD, COURTNEY ([id number]) on 04/13/2013 11:59:07 AM            MDM   1. Upper respiratory disease   2. Generalized weakness   3. Chest pain    Patient presents with generalized weakness, near syncope, chest pain, and cough. Patient is nontoxic-appearing on exam. Vital signs are notable for blood pressure of 183/68. Patient reports recent URI symptoms including a cough. She's had several days of chest pain that is worse with movement and coughing. She endorses that this is much different than the cardiac chest pain she's had in the past. EKG is unchanged and initial troponin is negative. At this time have low suspicion for ACS given history and physical exam. Patient does appear somewhat dry. Patient will be given a normal saline bolus. Last echo showed a normal EF.  She's not orthostatic by vital signs. Chest x-ray shows no evidence of pneumonia.  Patient was able to ambulate without any dizziness or syncopal filling. She has followup with cardiology on Thursday. Patient reports that she felt better following fluid administration. She will be discharged home. She was given strict  return precautions.  After history, exam, and medical workup I feel the patient has been appropriately medically screened and is safe for discharge home. Pertinent diagnoses were discussed with the patient. Patient was given return precautions.     Shon Batonourtney F Horton, MD 04/13/13 1410

## 2013-04-13 NOTE — Discharge Instructions (Signed)
You were seen today for generalized weakness and chest congestion.  Your work-up was unremarkable.  YOu have had some chest pain which is likely muscular in nature given history, physical exam and negative workup.  You need to follow-up with cardiology as scheduled on Thursday and you should return if you have any new or worsening of symptoms in the meantime.  Chest Pain (Nonspecific) It is often hard to give a specific diagnosis for the cause of chest pain. There is always a chance that your pain could be related to something serious, such as a heart attack or a blood clot in the lungs. You need to follow up with your caregiver for further evaluation. CAUSES   Heartburn.  Pneumonia or bronchitis.  Anxiety or stress.  Inflammation around your heart (pericarditis) or lung (pleuritis or pleurisy).  A blood clot in the lung.  A collapsed lung (pneumothorax). It can develop suddenly on its own (spontaneous pneumothorax) or from injury (trauma) to the chest.  Shingles infection (herpes zoster virus). The chest wall is composed of bones, muscles, and cartilage. Any of these can be the source of the pain.  The bones can be bruised by injury.  The muscles or cartilage can be strained by coughing or overwork.  The cartilage can be affected by inflammation and become sore (costochondritis). DIAGNOSIS  Lab tests or other studies, such as X-rays, electrocardiography, stress testing, or cardiac imaging, may be needed to find the cause of your pain.  TREATMENT   Treatment depends on what may be causing your chest pain. Treatment may include:  Acid blockers for heartburn.  Anti-inflammatory medicine.  Pain medicine for inflammatory conditions.  Antibiotics if an infection is present.  You may be advised to change lifestyle habits. This includes stopping smoking and avoiding alcohol, caffeine, and chocolate.  You may be advised to keep your head raised (elevated) when sleeping. This reduces  the chance of acid going backward from your stomach into your esophagus.  Most of the time, nonspecific chest pain will improve within 2 to 3 days with rest and mild pain medicine. HOME CARE INSTRUCTIONS   If antibiotics were prescribed, take your antibiotics as directed. Finish them even if you start to feel better.  For the next few days, avoid physical activities that bring on chest pain. Continue physical activities as directed.  Do not smoke.  Avoid drinking alcohol.  Only take over-the-counter or prescription medicine for pain, discomfort, or fever as directed by your caregiver.  Follow your caregiver's suggestions for further testing if your chest pain does not go away.  Keep any follow-up appointments you made. If you do not go to an appointment, you could develop lasting (chronic) problems with pain. If there is any problem keeping an appointment, you must call to reschedule. SEEK MEDICAL CARE IF:   You think you are having problems from the medicine you are taking. Read your medicine instructions carefully.  Your chest pain does not go away, even after treatment.  You develop a rash with blisters on your chest. SEEK IMMEDIATE MEDICAL CARE IF:   You have increased chest pain or pain that spreads to your arm, neck, jaw, back, or abdomen.  You develop shortness of breath, an increasing cough, or you are coughing up blood.  You have severe back or abdominal pain, feel nauseous, or vomit.  You develop severe weakness, fainting, or chills.  You have a fever. THIS IS AN EMERGENCY. Do not wait to see if the pain will go  away. Get medical help at once. Call your local emergency services (911 in [place] Do not drive yourself to the hospital. MAKE SURE YOU:   Understand these instructions.  Will watch your condition.  Will get help right away if you are not doing well or get worse. Document Released: 12/12/2004 Document Revised: 05/27/2011 Document Reviewed:  10/08/2007 [facility] Patient Information 2014 [place], [place]. Weakness Weakness is a lack of strength. It may be felt all over the body (generalized) or in one specific part of the body (focal). Some causes of weakness can be serious. You may need further medical evaluation, especially if you are elderly or you have a history of immunosuppression (such as chemotherapy or HIV), kidney disease, heart disease, or diabetes. CAUSES  Weakness can be caused by many different things, including:  Infection.  Physical exhaustion.  Internal bleeding or other blood loss that results in a lack of red blood cells (anemia).  Dehydration. This cause is more common in elderly people.  Side effects or electrolyte abnormalities from medicines, such as pain medicines or sedatives.  Emotional distress, anxiety, or depression.  Circulation problems, especially severe peripheral arterial disease.  Heart disease, such as rapid atrial fibrillation, bradycardia, or heart failure.  Nervous system disorders, such as Guillain-Barr syndrome, multiple sclerosis, or stroke. DIAGNOSIS  To find the cause of your weakness, your caregiver will take your history and perform a physical exam. Lab tests or X-rays may also be ordered, if needed. TREATMENT  Treatment of weakness depends on the cause of your symptoms and can vary greatly. HOME CARE INSTRUCTIONS   Rest as needed.  Eat a well-balanced diet.  Try to get some exercise every day.  Only take over-the-counter or prescription medicines as directed by your caregiver. SEEK MEDICAL CARE IF:   Your weakness seems to be getting worse or spreads to other parts of your body.  You develop new aches or pains. SEEK IMMEDIATE MEDICAL CARE IF:   You cannot perform your normal daily activities, such as getting dressed and feeding yourself.  You cannot walk up and down stairs, or you feel exhausted when you do so.  You have shortness of breath or chest pain.  You  have difficulty moving parts of your body.  You have weakness in only one area of the body or on only one side of the body.  You have a fever.  You have trouble speaking or swallowing.  You cannot control your bladder or bowel movements.  You have black or bloody vomit or stools. MAKE SURE YOU:  Understand these instructions.  Will watch your condition.  Will get help right away if you are not doing well or get worse. Document Released: 03/04/2005 Document Revised: 09/03/2011 Document Reviewed: 05/03/2011 [facility] Patient Information 2014 [place], [place]. Upper Respiratory Infection, Adult An upper respiratory infection (URI) is also sometimes known as the common cold. The upper respiratory tract includes the nose, sinuses, throat, trachea, and bronchi. Bronchi are the airways leading to the lungs. Most people improve within 1 week, but symptoms can last up to 2 weeks. A residual cough may last even longer.  CAUSES Many different viruses can infect the tissues lining the upper respiratory tract. The tissues become irritated and inflamed and often become very moist. Mucus production is also common. A cold is contagious. You can easily spread the virus to others by oral contact. This includes kissing, sharing a glass, coughing, or sneezing. Touching your mouth or nose and then touching a surface, which is then  touched by another person, can also spread the virus. SYMPTOMS  Symptoms typically develop 1 to 3 days after you come in contact with a cold virus. Symptoms vary from person to person. They may include:  Runny nose.  Sneezing.  Nasal congestion.  Sinus irritation.  Sore throat.  Loss of voice (laryngitis).  Cough.  Fatigue.  Muscle aches.  Loss of appetite.  Headache.  Low-grade fever. DIAGNOSIS  You might diagnose your own cold based on familiar symptoms, since most people get a cold 2 to 3 times a year. Your caregiver can confirm this based on your exam. Most  importantly, your caregiver can check that your symptoms are not due to another disease such as strep throat, sinusitis, pneumonia, asthma, or epiglottitis. Blood tests, throat tests, and X-rays are not necessary to diagnose a common cold, but they may sometimes be helpful in excluding other more serious diseases. Your caregiver will decide if any further tests are required. RISKS AND COMPLICATIONS  You may be at risk for a more severe case of the common cold if you smoke cigarettes, have chronic heart disease (such as heart failure) or lung disease (such as asthma), or if you have a weakened immune system. The very young and very old are also at risk for more serious infections. Bacterial sinusitis, middle ear infections, and bacterial pneumonia can complicate the common cold. The common cold can worsen asthma and chronic obstructive pulmonary disease (COPD). Sometimes, these complications can require emergency medical care and may be life-threatening. PREVENTION  The best way to protect against getting a cold is to practice good hygiene. Avoid oral or hand contact with people with cold symptoms. Wash your hands often if contact occurs. There is no clear evidence that vitamin C, vitamin E, echinacea, or exercise reduces the chance of developing a cold. However, it is always recommended to get plenty of rest and practice good nutrition. TREATMENT  Treatment is directed at relieving symptoms. There is no cure. Antibiotics are not effective, because the infection is caused by a virus, not by bacteria. Treatment may include:  Increased fluid intake. Sports drinks offer valuable electrolytes, sugars, and fluids.  Breathing heated mist or steam (vaporizer or shower).  Eating chicken soup or other clear broths, and maintaining good nutrition.  Getting plenty of rest.  Using gargles or lozenges for comfort.  Controlling fevers with ibuprofen or acetaminophen as directed by your caregiver.  Increasing  usage of your inhaler if you have asthma. Zinc gel and zinc lozenges, taken in the first 24 hours of the common cold, can shorten the duration and lessen the severity of symptoms. Pain medicines may help with fever, muscle aches, and throat pain. A variety of non-prescription medicines are available to treat congestion and runny nose. Your caregiver can make recommendations and may suggest nasal or lung inhalers for other symptoms.  HOME CARE INSTRUCTIONS   Only take over-the-counter or prescription medicines for pain, discomfort, or fever as directed by your caregiver.  Use a warm mist humidifier or inhale steam from a shower to increase air moisture. This may keep secretions moist and make it easier to breathe.  Drink enough water and fluids to keep your urine clear or pale yellow.  Rest as needed.  Return to work when your temperature has returned to normal or as your caregiver advises. You may need to stay home longer to avoid infecting others. You can also use a face mask and careful hand washing to prevent spread of the virus.  SEEK MEDICAL CARE IF:   After the first few days, you feel you are getting worse rather than better.  You need your caregiver's advice about medicines to control symptoms.  You develop chills, worsening shortness of breath, or brown or red sputum. These may be signs of pneumonia.  You develop yellow or brown nasal discharge or pain in the face, especially when you bend forward. These may be signs of sinusitis.  You develop a fever, swollen neck glands, pain with swallowing, or white areas in the back of your throat. These may be signs of strep throat. SEEK IMMEDIATE MEDICAL CARE IF:   You have a fever.  You develop severe or persistent headache, ear pain, sinus pain, or chest pain.  You develop wheezing, a prolonged cough, cough up blood, or have a change in your usual mucus (if you have chronic lung disease).  You develop sore muscles or a stiff  neck. Document Released: 08/28/2000 Document Revised: 05/27/2011 Document Reviewed: 07/06/2010 [facility] Patient Information 2014 [place], [place].

## 2013-04-13 NOTE — ED Notes (Signed)
Ambulated pt in the hallway w/o difficulty. Vital signs stable.

## 2013-04-13 NOTE — ED Notes (Signed)
Patient states she had a chest cold several weeks ago , c/o generalized weakness , last 2-3 days c/o dull aching in chest with soreness radiating into her back. C/o occ. Non-productive cough. 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

## 2013-04-13 NOTE — ED Notes (Signed)
No c/o dizziness with each orthostatic BP.

## 2013-04-13 NOTE — ED Notes (Signed)
Per main lab. Will collect new order from existing blood prev collection for INR

## 2013-04-13 NOTE — ED Notes (Signed)
IV site D/C

## 2013-04-15 ENCOUNTER — Ambulatory Visit (INDEPENDENT_AMBULATORY_CARE_PROVIDER_SITE_OTHER): Payer: Medicare Other | Admitting: Cardiology

## 2013-04-15 ENCOUNTER — Encounter: Payer: Self-pay | Admitting: Cardiology

## 2013-04-15 VITALS — BP 160/66 | HR 72 | Ht 66.5 in | Wt 159.6 lb

## 2013-04-15 DIAGNOSIS — Z951 Presence of aortocoronary bypass graft: Secondary | ICD-10-CM

## 2013-04-15 DIAGNOSIS — Z7901 Long term (current) use of anticoagulants: Secondary | ICD-10-CM

## 2013-04-15 DIAGNOSIS — E785 Hyperlipidemia, unspecified: Secondary | ICD-10-CM

## 2013-04-15 DIAGNOSIS — Z952 Presence of prosthetic heart valve: Secondary | ICD-10-CM

## 2013-04-15 DIAGNOSIS — Z954 Presence of other heart-valve replacement: Secondary | ICD-10-CM

## 2013-04-15 DIAGNOSIS — I10 Essential (primary) hypertension: Secondary | ICD-10-CM

## 2013-04-15 LAB — LIPID PANEL
CHOL/HDL RATIO: 4
Cholesterol: 174 mg/dL (ref 0–200)
HDL: 44.6 mg/dL (ref >39.00)
LDL CALC: 100 mg/dL — AB (ref 0–99)
Triglycerides: 146 mg/dL (ref 0.0–149.0)
VLDL: 29.2 mg/dL (ref 0.0–40.0)

## 2013-04-15 NOTE — Progress Notes (Signed)
Tamara SchwalbeAnne S Patterson Date of Birth: [birth year]   History of Present Illness: Tamara Patterson is seen today for followup. 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 This included mechanical aortic valve replacement and grafting of the aortic root with a #23 mm St. Jude valve conduit. She also had coronary bypass surgery including an IMA graft to the LAD, saphenous vein graft to the right coronary, and saphenous vein graft to the first and second obtuse marginal vessels. 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 She was treated with pulse steroids for a period of time and was weaned off of this. She has done very well since then.  She was seen in the ED 2 days ago. She states she had a cold 3 weeks ago and has had a hard time with persistent congestion in her throat. When she went to the ED she felt shaky, weak and had aching in her chest. Lab work, Ecg, and CXR were unremarkable.   Current Outpatient Prescriptions on File Prior to Visit  Medication Sig Dispense Refill  . cholecalciferol (VITAMIN D) 1000 UNITS tablet Take 2,000 Units by mouth daily.      . citalopram (CELEXA) 20 MG tablet Take 20 mg by mouth daily.       . clobetasol cream (TEMOVATE) 0.05 % Apply 1 application topically 2 (two) times daily as needed (itchy skin). Apply to area(s) on leg as needed for itchy skin      . Colesevelam HCl 3.75 G PACK Take 3.75 g by mouth daily. Take 1 packet in liquid daily      . cycloSPORINE (RESTASIS) 0.05 % ophthalmic emulsion Place 1 drop into both eyes 2 (two) times daily.      Marland Kitchen. estradiol (ESTRACE) 0.1 MG/GM vaginal cream Place 1 Applicatorful vaginally 3 (three) times a week.      . latanoprost (XALATAN) 0.005 % ophthalmic solution Place 1 drop into both eyes daily as needed (itchy or scartchy eyes).      Marland Kitchen. loteprednol (LOTEMAX) 0.5 % ophthalmic suspension Place 1 drop into both eyes daily as needed (irritation).      .  potassium gluconate 595 MG TABS tablet Take 1,190 mg by mouth daily.      Marland Kitchen. warfarin (COUMADIN) 5 MG tablet Take 5-7.5 mg by mouth daily. Take 7.5mg  on M,W, and F and 5mg  on all other days       No current facility-administered medications on file prior to visit.    Allergies  Allergen Reactions  . Atenolol     cramps  . Codeine Nausea Only  . Lipitor [Atorvastatin Calcium] Other (See Comments)    Caused muscle aching    Past Medical History  Diagnosis Date  . S/P AVR (aortic valve replacement)     with root grafting; #2623mm  St. Jude valve conduit; Last Echo 2007  . Hx of CABG 1991    LIMA-LAD, SVG-RCA, SVG-1st and 2ndOM  . Dyslipidemia   . HTN (hypertension)   . Anticoagulant long-term use   . Panic anxiety syndrome   . Giant cell arteritis     per aortotomy biopsy in 1991  . CAD (coronary artery disease)   . GERD (gastroesophageal reflux disease)     Past Surgical History  Procedure Laterality Date  . Coronary artery bypass graft    . Aortic valve replacement      with root grafting  . Cholecystectomy  History  Smoking status  . Former Smoker  Smokeless tobacco  . Not on file    History  Alcohol Use No    Family History  Problem Relation Age of Onset  . Liver disease Mother   . Pancreatic cancer Father     Review of Systems: As noted in history of present illness. All other systems were reviewed and are negative.  Physical Exam: BP 160/66  Pulse 72  Ht 5' 6.5" (1.689 m)  Wt 159 lb 9.6 oz (72.394 kg)  BMI 25.38 kg/m2 The patient is alert and oriented x 3.   The skin is warm and dry. The HEENT exam is normal.  The carotids are 2+ without bruits.  There is no thyromegaly.  There is no JVD.  The lungs are clear.    The heart exam reveals a regular rate with a normal Mechanical aortic valve click.  There are no murmurs, gallops, or rubs.  The PMI is not displaced.   Abdominal exam reveals good bowel sounds.  There is no guarding or rebound.  There is  no hepatosplenomegaly or tenderness.  There are no masses.  Exam of the legs reveal no clubbing, cyanosis, or edema.  The legs are without rashes.  The distal pulses are intact.  Cranial nerves II - XII are intact.  Motor and sensory functions are intact.  The gait is normal.  LABORATORY DATA: INR today was 3.52 ECG today demonstrates normal sinus rhythm with a rate of 70 beats per minute. 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   Assessment / Plan: 1. Status post mechanical aortic valve replacement. She is therapeutic on her Coumadin. Exam is stable.  2. Status post CABG for ostial coronary disease.  3. Hyperlipidemia. Will check a lipid panel today after a 3 month trial of Welchol.  4. Right bundle branch block. She is asymptomatic.

## 2013-04-15 NOTE — Patient Instructions (Signed)
We will check your cholesterol today on Welchol.  Hold Welchol and take claritin for your congestion.  I will see you in 6 months.

## 2013-04-29 ENCOUNTER — Ambulatory Visit (INDEPENDENT_AMBULATORY_CARE_PROVIDER_SITE_OTHER): Payer: Medicare Other | Admitting: Pharmacist

## 2013-04-29 DIAGNOSIS — Z952 Presence of prosthetic heart valve: Secondary | ICD-10-CM

## 2013-04-29 DIAGNOSIS — I359 Nonrheumatic aortic valve disorder, unspecified: Secondary | ICD-10-CM

## 2013-04-29 DIAGNOSIS — Z7901 Long term (current) use of anticoagulants: Secondary | ICD-10-CM

## 2013-04-29 DIAGNOSIS — Z954 Presence of other heart-valve replacement: Secondary | ICD-10-CM

## 2013-04-29 LAB — POCT INR: INR: 5.8

## 2013-05-03 ENCOUNTER — Ambulatory Visit (INDEPENDENT_AMBULATORY_CARE_PROVIDER_SITE_OTHER): Payer: Medicare Other | Admitting: *Deleted

## 2013-05-03 DIAGNOSIS — Z952 Presence of prosthetic heart valve: Secondary | ICD-10-CM

## 2013-05-03 DIAGNOSIS — Z954 Presence of other heart-valve replacement: Secondary | ICD-10-CM

## 2013-05-03 DIAGNOSIS — I359 Nonrheumatic aortic valve disorder, unspecified: Secondary | ICD-10-CM

## 2013-05-03 DIAGNOSIS — Z7901 Long term (current) use of anticoagulants: Secondary | ICD-10-CM

## 2013-05-03 LAB — POCT INR: INR: 4.1

## 2013-05-10 ENCOUNTER — Ambulatory Visit (INDEPENDENT_AMBULATORY_CARE_PROVIDER_SITE_OTHER): Payer: Medicare Other | Admitting: *Deleted

## 2013-05-10 DIAGNOSIS — Z954 Presence of other heart-valve replacement: Secondary | ICD-10-CM

## 2013-05-10 DIAGNOSIS — Z7901 Long term (current) use of anticoagulants: Secondary | ICD-10-CM

## 2013-05-10 DIAGNOSIS — Z952 Presence of prosthetic heart valve: Secondary | ICD-10-CM

## 2013-05-10 DIAGNOSIS — I359 Nonrheumatic aortic valve disorder, unspecified: Secondary | ICD-10-CM

## 2013-05-10 LAB — POCT INR: INR: 3.3

## 2013-05-24 ENCOUNTER — Ambulatory Visit (INDEPENDENT_AMBULATORY_CARE_PROVIDER_SITE_OTHER): Payer: Medicare Other | Admitting: *Deleted

## 2013-05-24 DIAGNOSIS — Z7901 Long term (current) use of anticoagulants: Secondary | ICD-10-CM

## 2013-05-24 DIAGNOSIS — Z954 Presence of other heart-valve replacement: Secondary | ICD-10-CM

## 2013-05-24 DIAGNOSIS — Z952 Presence of prosthetic heart valve: Secondary | ICD-10-CM

## 2013-05-24 DIAGNOSIS — I359 Nonrheumatic aortic valve disorder, unspecified: Secondary | ICD-10-CM

## 2013-05-24 LAB — POCT INR: INR: 2.2

## 2013-06-08 ENCOUNTER — Ambulatory Visit (INDEPENDENT_AMBULATORY_CARE_PROVIDER_SITE_OTHER): Payer: Medicare Other

## 2013-06-08 DIAGNOSIS — I359 Nonrheumatic aortic valve disorder, unspecified: Secondary | ICD-10-CM

## 2013-06-08 DIAGNOSIS — Z7901 Long term (current) use of anticoagulants: Secondary | ICD-10-CM

## 2013-06-08 DIAGNOSIS — Z954 Presence of other heart-valve replacement: Secondary | ICD-10-CM

## 2013-06-08 DIAGNOSIS — Z952 Presence of prosthetic heart valve: Secondary | ICD-10-CM

## 2013-06-08 LAB — POCT INR: INR: 1.9

## 2013-06-22 ENCOUNTER — Ambulatory Visit (INDEPENDENT_AMBULATORY_CARE_PROVIDER_SITE_OTHER): Payer: Medicare Other | Admitting: Pharmacist

## 2013-06-22 DIAGNOSIS — I359 Nonrheumatic aortic valve disorder, unspecified: Secondary | ICD-10-CM

## 2013-06-22 DIAGNOSIS — Z7901 Long term (current) use of anticoagulants: Secondary | ICD-10-CM

## 2013-06-22 LAB — POCT INR: INR: 2.4

## 2013-07-06 ENCOUNTER — Ambulatory Visit (INDEPENDENT_AMBULATORY_CARE_PROVIDER_SITE_OTHER): Payer: Medicare Other | Admitting: *Deleted

## 2013-07-06 DIAGNOSIS — I359 Nonrheumatic aortic valve disorder, unspecified: Secondary | ICD-10-CM

## 2013-07-06 DIAGNOSIS — Z7901 Long term (current) use of anticoagulants: Secondary | ICD-10-CM

## 2013-07-06 LAB — POCT INR: INR: 2.3

## 2013-07-20 ENCOUNTER — Ambulatory Visit (INDEPENDENT_AMBULATORY_CARE_PROVIDER_SITE_OTHER): Payer: Medicare Other

## 2013-07-20 DIAGNOSIS — Z7901 Long term (current) use of anticoagulants: Secondary | ICD-10-CM

## 2013-07-20 DIAGNOSIS — I359 Nonrheumatic aortic valve disorder, unspecified: Secondary | ICD-10-CM

## 2013-07-20 LAB — POCT INR: INR: 2

## 2013-08-03 ENCOUNTER — Ambulatory Visit (INDEPENDENT_AMBULATORY_CARE_PROVIDER_SITE_OTHER): Payer: Medicare Other

## 2013-08-03 DIAGNOSIS — I359 Nonrheumatic aortic valve disorder, unspecified: Secondary | ICD-10-CM

## 2013-08-03 DIAGNOSIS — Z7901 Long term (current) use of anticoagulants: Secondary | ICD-10-CM

## 2013-08-03 LAB — POCT INR: INR: 3.4

## 2013-08-24 ENCOUNTER — Ambulatory Visit (INDEPENDENT_AMBULATORY_CARE_PROVIDER_SITE_OTHER): Payer: Medicare Other | Admitting: Pharmacist

## 2013-08-24 DIAGNOSIS — Z7901 Long term (current) use of anticoagulants: Secondary | ICD-10-CM

## 2013-08-24 DIAGNOSIS — I359 Nonrheumatic aortic valve disorder, unspecified: Secondary | ICD-10-CM

## 2013-08-24 LAB — POCT INR: INR: 4.3

## 2013-09-07 ENCOUNTER — Ambulatory Visit (INDEPENDENT_AMBULATORY_CARE_PROVIDER_SITE_OTHER): Payer: Medicare Other | Admitting: Pharmacist Clinician (PhC)/ Clinical Pharmacy Specialist

## 2013-09-07 DIAGNOSIS — I359 Nonrheumatic aortic valve disorder, unspecified: Secondary | ICD-10-CM

## 2013-09-07 DIAGNOSIS — Z7901 Long term (current) use of anticoagulants: Secondary | ICD-10-CM

## 2013-09-07 LAB — POCT INR: INR: 2.9

## 2013-10-05 ENCOUNTER — Ambulatory Visit (INDEPENDENT_AMBULATORY_CARE_PROVIDER_SITE_OTHER): Payer: Medicare Other | Admitting: Pharmacist Clinician (PhC)/ Clinical Pharmacy Specialist

## 2013-10-05 DIAGNOSIS — I359 Nonrheumatic aortic valve disorder, unspecified: Secondary | ICD-10-CM

## 2013-10-05 DIAGNOSIS — Z7901 Long term (current) use of anticoagulants: Secondary | ICD-10-CM

## 2013-10-05 LAB — POCT INR: INR: 2.9

## 2013-10-15 ENCOUNTER — Other Ambulatory Visit: Payer: Self-pay | Admitting: Cardiology

## 2013-11-01 ENCOUNTER — Ambulatory Visit: Payer: Medicare Other | Admitting: Pharmacist Clinician (PhC)/ Clinical Pharmacy Specialist

## 2013-11-03 ENCOUNTER — Ambulatory Visit (INDEPENDENT_AMBULATORY_CARE_PROVIDER_SITE_OTHER): Payer: Medicare Other | Admitting: Pharmacist Clinician (PhC)/ Clinical Pharmacy Specialist

## 2013-11-03 DIAGNOSIS — I359 Nonrheumatic aortic valve disorder, unspecified: Secondary | ICD-10-CM

## 2013-11-03 DIAGNOSIS — Z7901 Long term (current) use of anticoagulants: Secondary | ICD-10-CM

## 2013-11-03 LAB — POCT INR: INR: 3.4

## 2013-11-29 ENCOUNTER — Ambulatory Visit (INDEPENDENT_AMBULATORY_CARE_PROVIDER_SITE_OTHER): Payer: Medicare Other | Admitting: Pharmacist Clinician (PhC)/ Clinical Pharmacy Specialist

## 2013-11-29 DIAGNOSIS — Z7901 Long term (current) use of anticoagulants: Secondary | ICD-10-CM

## 2013-11-29 DIAGNOSIS — I359 Nonrheumatic aortic valve disorder, unspecified: Secondary | ICD-10-CM

## 2013-11-29 LAB — POCT INR: INR: 4

## 2013-12-01 ENCOUNTER — Telehealth: Payer: Self-pay

## 2013-12-01 NOTE — Telephone Encounter (Signed)
Received form from Dr.Stephen Mackler DDS.Dr.Jordan advised will need to hold Coumadin 3 days prior to dental surgery.Form faxed back to fax # [phone].

## 2013-12-20 ENCOUNTER — Ambulatory Visit (INDEPENDENT_AMBULATORY_CARE_PROVIDER_SITE_OTHER): Payer: Medicare Other | Admitting: Pharmacist Clinician (PhC)/ Clinical Pharmacy Specialist

## 2013-12-20 DIAGNOSIS — Z7901 Long term (current) use of anticoagulants: Secondary | ICD-10-CM

## 2013-12-20 DIAGNOSIS — I359 Nonrheumatic aortic valve disorder, unspecified: Secondary | ICD-10-CM

## 2013-12-20 LAB — POCT INR: INR: 3.6

## 2014-01-17 ENCOUNTER — Ambulatory Visit (INDEPENDENT_AMBULATORY_CARE_PROVIDER_SITE_OTHER): Payer: Medicare Other | Admitting: Pharmacist Clinician (PhC)/ Clinical Pharmacy Specialist

## 2014-01-17 DIAGNOSIS — Z7901 Long term (current) use of anticoagulants: Secondary | ICD-10-CM

## 2014-01-17 DIAGNOSIS — I359 Nonrheumatic aortic valve disorder, unspecified: Secondary | ICD-10-CM

## 2014-01-17 LAB — POCT INR: INR: 2.3

## 2014-01-31 ENCOUNTER — Ambulatory Visit (INDEPENDENT_AMBULATORY_CARE_PROVIDER_SITE_OTHER): Payer: Medicare Other | Admitting: Pharmacist Clinician (PhC)/ Clinical Pharmacy Specialist

## 2014-01-31 DIAGNOSIS — I359 Nonrheumatic aortic valve disorder, unspecified: Secondary | ICD-10-CM

## 2014-01-31 DIAGNOSIS — Z7901 Long term (current) use of anticoagulants: Secondary | ICD-10-CM

## 2014-01-31 LAB — POCT INR: INR: 2.8

## 2014-02-28 ENCOUNTER — Ambulatory Visit (INDEPENDENT_AMBULATORY_CARE_PROVIDER_SITE_OTHER): Payer: Medicare Other | Admitting: Pharmacist Clinician (PhC)/ Clinical Pharmacy Specialist

## 2014-02-28 DIAGNOSIS — I359 Nonrheumatic aortic valve disorder, unspecified: Secondary | ICD-10-CM

## 2014-02-28 DIAGNOSIS — Z7901 Long term (current) use of anticoagulants: Secondary | ICD-10-CM

## 2014-02-28 LAB — POCT INR: INR: 3.2

## 2014-03-25 ENCOUNTER — Ambulatory Visit (INDEPENDENT_AMBULATORY_CARE_PROVIDER_SITE_OTHER): Payer: Medicare Other | Admitting: Cardiology

## 2014-03-25 ENCOUNTER — Ambulatory Visit (INDEPENDENT_AMBULATORY_CARE_PROVIDER_SITE_OTHER): Payer: Medicare Other | Admitting: Pharmacist Clinician (PhC)/ Clinical Pharmacy Specialist

## 2014-03-25 ENCOUNTER — Encounter: Payer: Self-pay | Admitting: Cardiology

## 2014-03-25 VITALS — BP 154/76 | HR 6 | Ht 65.0 in | Wt 157.3 lb

## 2014-03-25 DIAGNOSIS — I10 Essential (primary) hypertension: Secondary | ICD-10-CM

## 2014-03-25 DIAGNOSIS — Z951 Presence of aortocoronary bypass graft: Secondary | ICD-10-CM

## 2014-03-25 DIAGNOSIS — E785 Hyperlipidemia, unspecified: Secondary | ICD-10-CM

## 2014-03-25 DIAGNOSIS — Z7901 Long term (current) use of anticoagulants: Secondary | ICD-10-CM

## 2014-03-25 DIAGNOSIS — Z952 Presence of prosthetic heart valve: Secondary | ICD-10-CM

## 2014-03-25 DIAGNOSIS — Z954 Presence of other heart-valve replacement: Secondary | ICD-10-CM

## 2014-03-25 DIAGNOSIS — I359 Nonrheumatic aortic valve disorder, unspecified: Secondary | ICD-10-CM

## 2014-03-25 LAB — LIPID PANEL
CHOL/HDL RATIO: 3.7 ratio
CHOLESTEROL: 177 mg/dL (ref 0–200)
HDL: 48 mg/dL (ref >39)
LDL CALC: 110 mg/dL — AB (ref 0–99)
Triglycerides: 94 mg/dL (ref <150)
VLDL: 19 mg/dL (ref 0–40)

## 2014-03-25 LAB — POCT INR: INR: 3.3

## 2014-03-25 NOTE — Patient Instructions (Signed)
Continue your current therapy  We will check your lipids today  I will see you in one year 

## 2014-03-25 NOTE — Progress Notes (Signed)
Tamara SchwalbeAnne S Patterson Date of Birth: [birth year]   History of Present Illness: Tamara Patterson is seen today for followup. 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 This included mechanical aortic valve replacement and grafting of the aortic root with a #23 mm St. Jude valve conduit. She also had coronary bypass surgery including an IMA graft to the LAD, saphenous vein graft to the right coronary, and saphenous vein graft to the first and second obtuse marginal vessels. 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 She was treated with pulse steroids for a period of time and was weaned off of this. She has done very well since then.  On follow up today she generally feels well but has a number of concerns. She thinks she is retaining fluid although she has no swelling or weight gain. She stopped taking Welchol because it made her feel bad and shaky. BP at home is 120 systolic at the highest. She has no chest pain, dyspnea, or palpiations. Sometimes if she eats too much she gets pain in her left shoulder if she then exerts herself.   Current Outpatient Prescriptions on File Prior to Visit  Medication Sig Dispense Refill  . Acetaminophen (TYLENOL PO) Take by mouth as needed.    . cholecalciferol (VITAMIN D) 1000 UNITS tablet Take 2,000 Units by mouth daily.    . citalopram (CELEXA) 20 MG tablet Take 20 mg by mouth daily.     . clobetasol cream (TEMOVATE) 0.05 % Apply 1 application topically 2 (two) times daily as needed (itchy skin). Apply to area(s) on leg as needed for itchy skin    . cycloSPORINE (RESTASIS) 0.05 % ophthalmic emulsion Place 1 drop into both eyes 2 (two) times daily.    Marland Kitchen. estradiol (ESTRACE) 0.1 MG/GM vaginal cream Place 1 Applicatorful vaginally 3 (three) times a week.    . loteprednol (LOTEMAX) 0.5 % ophthalmic suspension Place 1 drop into both eyes daily as needed (irritation).    . pantoprazole (PROTONIX) 40  MG tablet Take 40 mg by mouth daily.    Marland Kitchen. warfarin (COUMADIN) 5 MG tablet Take 5-7.5 mg by mouth daily. Take 7.5mg  on M,W, and F and 5mg  on all other days    . warfarin (COUMADIN) 5 MG tablet TAKE AS DIRECTED BY COUMADIN CLINIC 135 tablet 1   No current facility-administered medications on file prior to visit.    Allergies  Allergen Reactions  . Atenolol     cramps  . Codeine Nausea Only  . Lipitor [Atorvastatin Calcium] Other (See Comments)    Caused muscle aching    Past Medical History  Diagnosis Date  . S/P AVR (aortic valve replacement)     with root grafting; #5323mm  St. Jude valve conduit; Last Echo 2007  . Hx of CABG 1991    LIMA-LAD, SVG-RCA, SVG-1st and 2ndOM  . Dyslipidemia   . HTN (hypertension)   . Anticoagulant long-term use   . Panic anxiety syndrome   . Giant cell arteritis     per aortotomy biopsy in 1991  . CAD (coronary artery disease)   . GERD (gastroesophageal reflux disease)     Past Surgical History  Procedure Laterality Date  . Coronary artery bypass graft    . Aortic valve replacement      with root grafting  . Cholecystectomy      History  Smoking status  . Former Smoker  Smokeless tobacco  .  Not on file    History  Alcohol Use No    Family History  Problem Relation Age of Onset  . Liver disease Mother   . Pancreatic cancer Father     Review of Systems: As noted in history of present illness. All other systems were reviewed and are negative.  Physical Exam: BP 154/76 mmHg  Pulse 6  Ht  (1.651 m)  Wt 157 lb 4.8 oz (71.351 kg)  BMI 26.18 kg/m2 The patient is alert and oriented x 3.   The skin is warm and dry. The HEENT exam is normal.  The carotids are 2+ without bruits.  There is no thyromegaly.  There is no JVD.  The lungs are clear.    The heart exam reveals a regular rate with a normal Mechanical aortic valve click.  There are no murmurs, gallops, or rubs.  The PMI is not displaced.   Abdominal exam reveals good bowel  sounds.  There is no guarding or rebound.  There is no hepatosplenomegaly or tenderness.  There are no masses.  Exam of the legs reveal no clubbing, cyanosis, or edema.  The legs are without rashes.  The distal pulses are intact.  Cranial nerves II - XII are intact.  Motor and sensory functions are intact.  The gait is normal.  LABORATORY DATA: INR today is pending.  ECG today demonstrates normal sinus rhythm with a rate of 61 beats per minute. 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 LVH. No change. I have personally reviewed and interpreted this study.    Assessment / Plan: 1. Status post mechanical aortic valve replacement. Will check INR. Exam is stable.  2. Status post CABG for ostial coronary disease. No angina  3. Hyperlipidemia. Will check a lipid panel today. Encourage dietary modification  4. Right bundle branch block/LAFB. She is asymptomatic.  5. Elevated BP- white coat syndrome. Readings normal at home.

## 2014-04-25 ENCOUNTER — Other Ambulatory Visit: Payer: Self-pay | Admitting: *Deleted

## 2014-04-25 MED ORDER — WARFARIN SODIUM 5 MG PO TABS: ORAL_TABLET | ORAL | Status: DC

## 2014-04-25 NOTE — Telephone Encounter (Signed)
Refill done as requested 

## 2014-05-06 ENCOUNTER — Ambulatory Visit (INDEPENDENT_AMBULATORY_CARE_PROVIDER_SITE_OTHER): Payer: Medicare Other | Admitting: Pharmacist Clinician (PhC)/ Clinical Pharmacy Specialist

## 2014-05-06 DIAGNOSIS — I359 Nonrheumatic aortic valve disorder, unspecified: Secondary | ICD-10-CM

## 2014-05-06 DIAGNOSIS — Z7901 Long term (current) use of anticoagulants: Secondary | ICD-10-CM

## 2014-05-06 LAB — POCT INR: INR: 3.9

## 2014-05-27 ENCOUNTER — Ambulatory Visit: Payer: Medicare Other | Admitting: Pharmacist Clinician (PhC)/ Clinical Pharmacy Specialist

## 2014-05-30 ENCOUNTER — Ambulatory Visit (INDEPENDENT_AMBULATORY_CARE_PROVIDER_SITE_OTHER): Payer: Medicare Other | Admitting: Pharmacist Clinician (PhC)/ Clinical Pharmacy Specialist

## 2014-05-30 DIAGNOSIS — Z7901 Long term (current) use of anticoagulants: Secondary | ICD-10-CM

## 2014-05-30 DIAGNOSIS — I359 Nonrheumatic aortic valve disorder, unspecified: Secondary | ICD-10-CM

## 2014-05-30 LAB — POCT INR: INR: 3

## 2014-07-04 ENCOUNTER — Ambulatory Visit (INDEPENDENT_AMBULATORY_CARE_PROVIDER_SITE_OTHER): Payer: Medicare Other | Admitting: Pharmacist Clinician (PhC)/ Clinical Pharmacy Specialist

## 2014-07-04 DIAGNOSIS — Z7901 Long term (current) use of anticoagulants: Secondary | ICD-10-CM

## 2014-07-04 DIAGNOSIS — I359 Nonrheumatic aortic valve disorder, unspecified: Secondary | ICD-10-CM | POA: Diagnosis not present

## 2014-07-04 LAB — POCT INR: INR: 2.7

## 2014-07-11 ENCOUNTER — Ambulatory Visit: Payer: Medicare Other | Admitting: Pharmacist Clinician (PhC)/ Clinical Pharmacy Specialist

## 2014-07-23 ENCOUNTER — Other Ambulatory Visit: Payer: Self-pay | Admitting: Pharmacist Clinician (PhC)/ Clinical Pharmacy Specialist

## 2014-07-23 MED ORDER — WARFARIN SODIUM 5 MG PO TABS: ORAL_TABLET | ORAL | Status: DC

## 2014-07-26 IMAGING — CR DG CHEST 1V
1 series · 1 of 1 positions shown · non-contrast
Comparison: Portable chest 05/15/2006.

CLINICAL DATA: Positive PPD.  No active symptoms.

CHEST - 1 VIEW

[view not recorded]
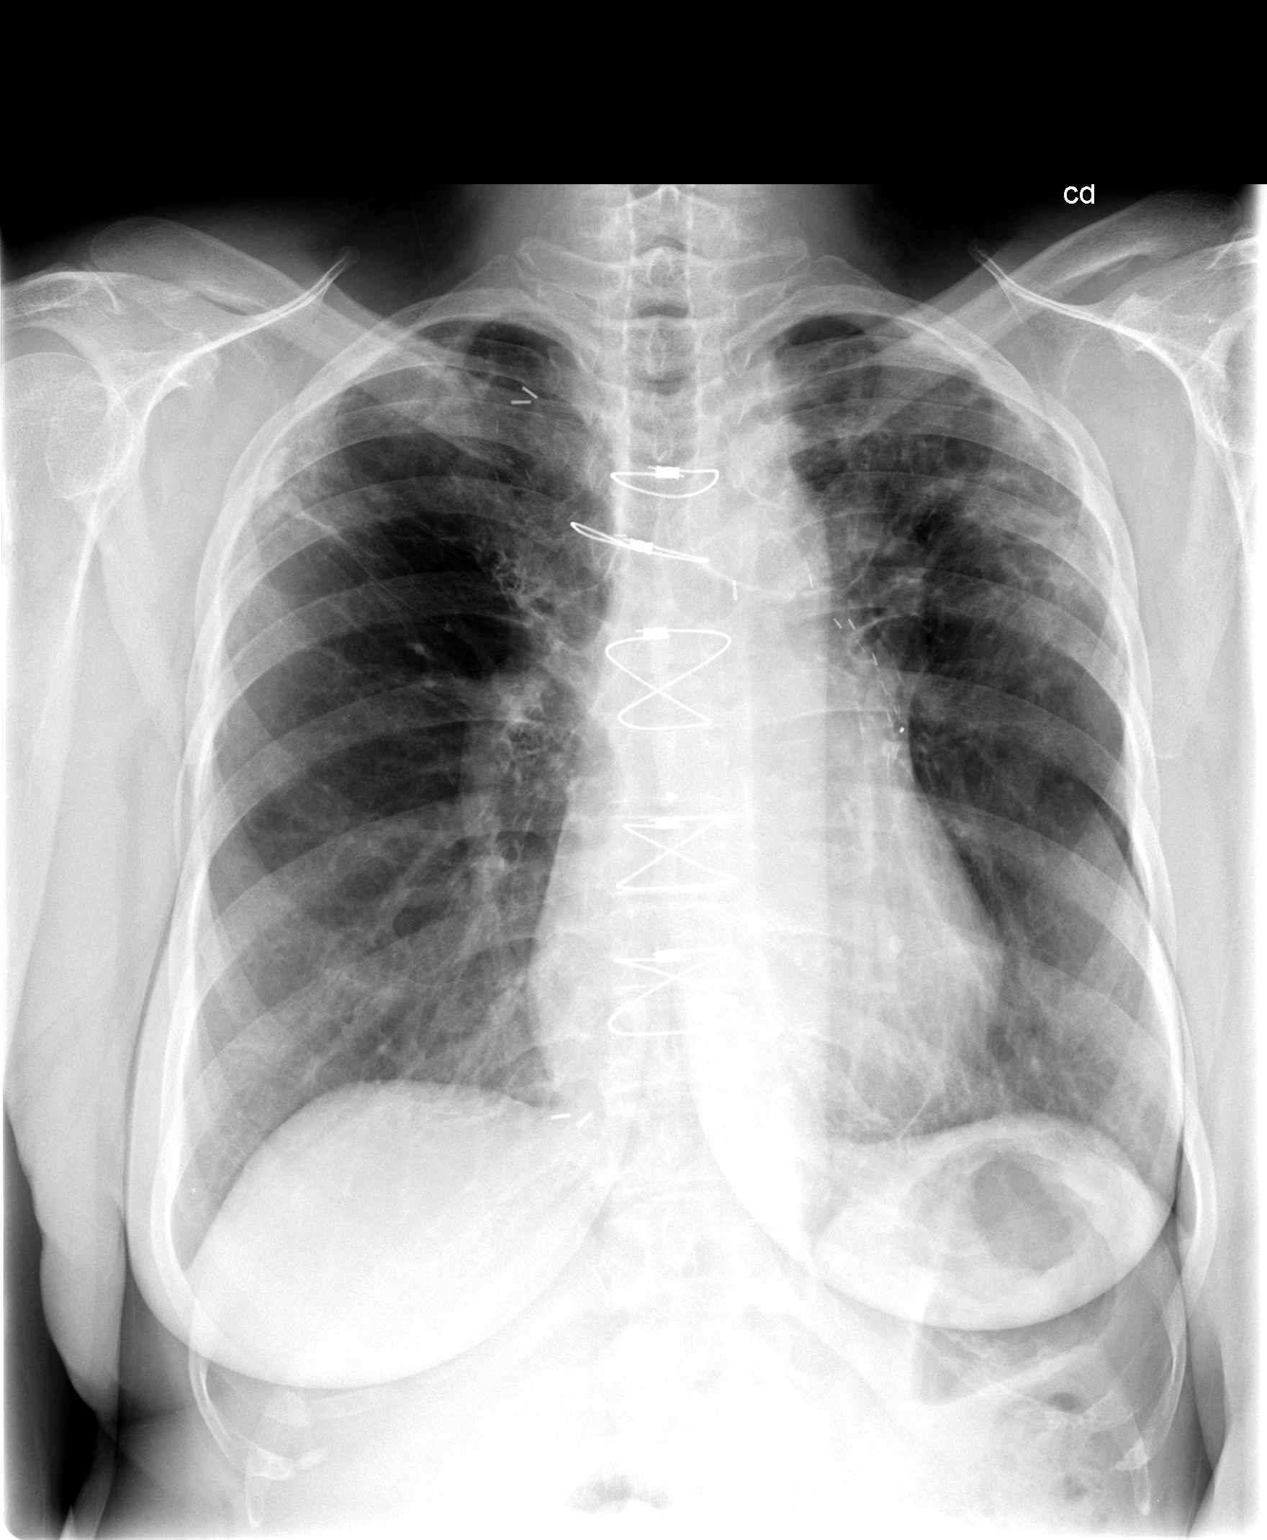

[1 of 1 positions shown; findings below may reference images not displayed]

FINDINGS: The heart size is normal. Biapical pleural parenchymal
disease has progressed, particularly at the right lung apex.  There
is cephalization of the hila bilaterally.  The lower lung fields
are clear.
IMPRESSION: 1.  Interval progression of biapical pleural parenchymal disease,
particularly on the right.
2.  The lower lung fields are clear.

This likely represents progression of chronic pleural disease.
Interval tuberculous infection could have this appearance.  CT of
the chest with contrast would be useful for further evaluation.

## 2014-08-10 ENCOUNTER — Ambulatory Visit (INDEPENDENT_AMBULATORY_CARE_PROVIDER_SITE_OTHER): Payer: Medicare Other | Admitting: Pharmacist Clinician (PhC)/ Clinical Pharmacy Specialist

## 2014-08-10 ENCOUNTER — Other Ambulatory Visit: Payer: Self-pay | Admitting: Pharmacist Clinician (PhC)/ Clinical Pharmacy Specialist

## 2014-08-10 DIAGNOSIS — Z7901 Long term (current) use of anticoagulants: Secondary | ICD-10-CM | POA: Diagnosis not present

## 2014-08-10 DIAGNOSIS — I359 Nonrheumatic aortic valve disorder, unspecified: Secondary | ICD-10-CM

## 2014-08-10 LAB — POCT INR: INR: 4.3

## 2014-08-10 MED ORDER — WARFARIN SODIUM 5 MG PO TABS: ORAL_TABLET | ORAL | Status: DC

## 2014-08-24 ENCOUNTER — Ambulatory Visit (INDEPENDENT_AMBULATORY_CARE_PROVIDER_SITE_OTHER): Payer: Medicare Other | Admitting: Pharmacist Clinician (PhC)/ Clinical Pharmacy Specialist

## 2014-08-24 DIAGNOSIS — I359 Nonrheumatic aortic valve disorder, unspecified: Secondary | ICD-10-CM

## 2014-08-24 DIAGNOSIS — Z7901 Long term (current) use of anticoagulants: Secondary | ICD-10-CM | POA: Diagnosis not present

## 2014-08-24 LAB — POCT INR: INR: 2.8

## 2014-09-21 ENCOUNTER — Ambulatory Visit (INDEPENDENT_AMBULATORY_CARE_PROVIDER_SITE_OTHER): Payer: Medicare Other | Admitting: Pharmacist Clinician (PhC)/ Clinical Pharmacy Specialist

## 2014-09-21 DIAGNOSIS — I359 Nonrheumatic aortic valve disorder, unspecified: Secondary | ICD-10-CM | POA: Diagnosis not present

## 2014-09-21 DIAGNOSIS — Z7901 Long term (current) use of anticoagulants: Secondary | ICD-10-CM

## 2014-09-21 LAB — POCT INR: INR: 5.4

## 2014-10-03 ENCOUNTER — Ambulatory Visit (INDEPENDENT_AMBULATORY_CARE_PROVIDER_SITE_OTHER): Payer: Medicare Other | Admitting: Pharmacist Clinician (PhC)/ Clinical Pharmacy Specialist

## 2014-10-03 DIAGNOSIS — Z7901 Long term (current) use of anticoagulants: Secondary | ICD-10-CM | POA: Diagnosis not present

## 2014-10-03 DIAGNOSIS — I359 Nonrheumatic aortic valve disorder, unspecified: Secondary | ICD-10-CM

## 2014-10-03 LAB — POCT INR: INR: 5.2

## 2014-10-14 ENCOUNTER — Ambulatory Visit (INDEPENDENT_AMBULATORY_CARE_PROVIDER_SITE_OTHER): Payer: Medicare Other | Admitting: Pharmacist Clinician (PhC)/ Clinical Pharmacy Specialist

## 2014-10-14 DIAGNOSIS — I359 Nonrheumatic aortic valve disorder, unspecified: Secondary | ICD-10-CM | POA: Diagnosis not present

## 2014-10-14 DIAGNOSIS — Z7901 Long term (current) use of anticoagulants: Secondary | ICD-10-CM | POA: Diagnosis not present

## 2014-10-14 LAB — POCT INR: INR: 2.2

## 2014-10-28 ENCOUNTER — Ambulatory Visit: Payer: Medicare Other | Admitting: Pharmacist Clinician (PhC)/ Clinical Pharmacy Specialist

## 2014-10-31 ENCOUNTER — Ambulatory Visit (INDEPENDENT_AMBULATORY_CARE_PROVIDER_SITE_OTHER): Payer: Medicare Other | Admitting: Pharmacist Clinician (PhC)/ Clinical Pharmacy Specialist

## 2014-10-31 DIAGNOSIS — I359 Nonrheumatic aortic valve disorder, unspecified: Secondary | ICD-10-CM

## 2014-10-31 DIAGNOSIS — Z7901 Long term (current) use of anticoagulants: Secondary | ICD-10-CM

## 2014-10-31 LAB — POCT INR: INR: 2.9

## 2014-11-28 ENCOUNTER — Ambulatory Visit (INDEPENDENT_AMBULATORY_CARE_PROVIDER_SITE_OTHER): Payer: Medicare Other | Admitting: Pharmacist Clinician (PhC)/ Clinical Pharmacy Specialist

## 2014-11-28 DIAGNOSIS — I359 Nonrheumatic aortic valve disorder, unspecified: Secondary | ICD-10-CM | POA: Diagnosis not present

## 2014-11-28 DIAGNOSIS — Z7901 Long term (current) use of anticoagulants: Secondary | ICD-10-CM

## 2014-11-28 LAB — POCT INR: INR: 2

## 2014-12-14 ENCOUNTER — Ambulatory Visit: Payer: Medicare Other | Admitting: Pharmacist Clinician (PhC)/ Clinical Pharmacy Specialist

## 2014-12-15 ENCOUNTER — Ambulatory Visit (INDEPENDENT_AMBULATORY_CARE_PROVIDER_SITE_OTHER): Payer: Medicare Other | Admitting: Pharmacist Clinician (PhC)/ Clinical Pharmacy Specialist

## 2014-12-15 DIAGNOSIS — I359 Nonrheumatic aortic valve disorder, unspecified: Secondary | ICD-10-CM

## 2014-12-15 DIAGNOSIS — Z7901 Long term (current) use of anticoagulants: Secondary | ICD-10-CM

## 2014-12-15 LAB — POCT INR: INR: 2

## 2014-12-28 ENCOUNTER — Ambulatory Visit (INDEPENDENT_AMBULATORY_CARE_PROVIDER_SITE_OTHER): Payer: Medicare Other | Admitting: Pharmacist

## 2014-12-28 DIAGNOSIS — I359 Nonrheumatic aortic valve disorder, unspecified: Secondary | ICD-10-CM

## 2014-12-28 DIAGNOSIS — Z7901 Long term (current) use of anticoagulants: Secondary | ICD-10-CM

## 2014-12-28 LAB — POCT INR: INR: 1.6

## 2015-01-13 ENCOUNTER — Ambulatory Visit (INDEPENDENT_AMBULATORY_CARE_PROVIDER_SITE_OTHER): Payer: Medicare Other | Admitting: Pharmacist Clinician (PhC)/ Clinical Pharmacy Specialist

## 2015-01-13 DIAGNOSIS — Z7901 Long term (current) use of anticoagulants: Secondary | ICD-10-CM | POA: Diagnosis not present

## 2015-01-13 DIAGNOSIS — I359 Nonrheumatic aortic valve disorder, unspecified: Secondary | ICD-10-CM

## 2015-01-13 LAB — POCT INR: INR: 3.1

## 2015-02-01 ENCOUNTER — Ambulatory Visit (INDEPENDENT_AMBULATORY_CARE_PROVIDER_SITE_OTHER): Payer: Medicare Other | Admitting: Pharmacist Clinician (PhC)/ Clinical Pharmacy Specialist

## 2015-02-01 DIAGNOSIS — Z7901 Long term (current) use of anticoagulants: Secondary | ICD-10-CM | POA: Diagnosis not present

## 2015-02-01 DIAGNOSIS — I359 Nonrheumatic aortic valve disorder, unspecified: Secondary | ICD-10-CM

## 2015-02-01 LAB — POCT INR: INR: 4.5

## 2015-02-17 ENCOUNTER — Ambulatory Visit (INDEPENDENT_AMBULATORY_CARE_PROVIDER_SITE_OTHER): Payer: Medicare Other | Admitting: Pharmacist Clinician (PhC)/ Clinical Pharmacy Specialist

## 2015-02-17 DIAGNOSIS — Z7901 Long term (current) use of anticoagulants: Secondary | ICD-10-CM

## 2015-02-17 DIAGNOSIS — I359 Nonrheumatic aortic valve disorder, unspecified: Secondary | ICD-10-CM

## 2015-02-17 LAB — POCT INR: INR: 3.2

## 2015-03-10 ENCOUNTER — Ambulatory Visit (INDEPENDENT_AMBULATORY_CARE_PROVIDER_SITE_OTHER): Payer: Medicare Other | Admitting: Pharmacist Clinician (PhC)/ Clinical Pharmacy Specialist

## 2015-03-10 DIAGNOSIS — I359 Nonrheumatic aortic valve disorder, unspecified: Secondary | ICD-10-CM

## 2015-03-10 DIAGNOSIS — Z7901 Long term (current) use of anticoagulants: Secondary | ICD-10-CM | POA: Diagnosis not present

## 2015-03-10 LAB — POCT INR: INR: 2.8

## 2015-03-13 ENCOUNTER — Emergency Department (HOSPITAL_COMMUNITY)
Admission: EM | Admit: 2015-03-13 | Discharge: 2015-03-13 | Disposition: A | Discharge status: Home / Self Care | Payer: Medicare Other | Attending: Emergency Medicine | Admitting: Emergency Medicine

## 2015-03-13 ENCOUNTER — Encounter (HOSPITAL_COMMUNITY): Payer: Self-pay | Admitting: *Deleted

## 2015-03-13 DIAGNOSIS — R1110 Vomiting, unspecified: Secondary | ICD-10-CM | POA: Insufficient documentation

## 2015-03-13 DIAGNOSIS — Z7901 Long term (current) use of anticoagulants: Secondary | ICD-10-CM | POA: Diagnosis not present

## 2015-03-13 DIAGNOSIS — Z8739 Personal history of other diseases of the musculoskeletal system and connective tissue: Secondary | ICD-10-CM | POA: Diagnosis not present

## 2015-03-13 DIAGNOSIS — Z79899 Other long term (current) drug therapy: Secondary | ICD-10-CM | POA: Insufficient documentation

## 2015-03-13 DIAGNOSIS — Z952 Presence of prosthetic heart valve: Secondary | ICD-10-CM | POA: Diagnosis not present

## 2015-03-13 DIAGNOSIS — I10 Essential (primary) hypertension: Secondary | ICD-10-CM | POA: Diagnosis not present

## 2015-03-13 DIAGNOSIS — Z8639 Personal history of other endocrine, nutritional and metabolic disease: Secondary | ICD-10-CM | POA: Diagnosis not present

## 2015-03-13 DIAGNOSIS — I2510 Atherosclerotic heart disease of native coronary artery without angina pectoris: Secondary | ICD-10-CM | POA: Insufficient documentation

## 2015-03-13 DIAGNOSIS — Z8719 Personal history of other diseases of the digestive system: Secondary | ICD-10-CM | POA: Insufficient documentation

## 2015-03-13 DIAGNOSIS — R197 Diarrhea, unspecified: Secondary | ICD-10-CM | POA: Insufficient documentation

## 2015-03-13 DIAGNOSIS — F410 Panic disorder [episodic paroxysmal anxiety] without agoraphobia: Secondary | ICD-10-CM | POA: Diagnosis not present

## 2015-03-13 DIAGNOSIS — Z951 Presence of aortocoronary bypass graft: Secondary | ICD-10-CM | POA: Diagnosis not present

## 2015-03-13 DIAGNOSIS — Z87891 Personal history of nicotine dependence: Secondary | ICD-10-CM | POA: Diagnosis not present

## 2015-03-13 LAB — COMPREHENSIVE METABOLIC PANEL
ALBUMIN: 3.8 g/dL (ref 3.5–5.0)
ALT: 32 U/L (ref 14–54)
AST: 40 U/L (ref 15–41)
Alkaline Phosphatase: 84 U/L (ref 38–126)
Anion gap: 7 (ref 5–15)
BILIRUBIN TOTAL: 1.2 mg/dL (ref 0.3–1.2)
BUN: 7 mg/dL (ref 6–20)
CHLORIDE: 108 mmol/L (ref 101–111)
CO2: 26 mmol/L (ref 22–32)
Calcium: 9 mg/dL (ref 8.9–10.3)
Creatinine, Ser: 0.92 mg/dL (ref 0.44–1.00)
GFR calc Af Amer: >60 mL/min (ref >60)
GFR calc non Af Amer: 59 mL/min — ABNORMAL LOW (ref >60)
GLUCOSE: 140 mg/dL — AB (ref 65–99)
POTASSIUM: 3.6 mmol/L (ref 3.5–5.1)
Sodium: 141 mmol/L (ref 135–145)
Total Protein: 6.8 g/dL (ref 6.5–8.1)

## 2015-03-13 LAB — URINALYSIS, ROUTINE W REFLEX MICROSCOPIC
BILIRUBIN URINE: NEGATIVE
GLUCOSE, UA: NEGATIVE mg/dL
KETONES UR: NEGATIVE mg/dL
Leukocytes, UA: NEGATIVE
Nitrite: NEGATIVE
PH: 5 (ref 5.0–8.0)
Protein, ur: NEGATIVE mg/dL
Specific Gravity, Urine: 1.013 (ref 1.005–1.030)

## 2015-03-13 LAB — CBC
HEMATOCRIT: 38.6 % (ref 36.0–46.0)
Hemoglobin: 12.6 g/dL (ref 12.0–15.0)
MCH: 26.9 pg (ref 26.0–34.0)
MCHC: 32.6 g/dL (ref 30.0–36.0)
MCV: 82.5 fL (ref 78.0–100.0)
Platelets: 194 10*3/uL (ref 150–400)
RBC: 4.68 MIL/uL (ref 3.87–5.11)
RDW: 13.7 % (ref 11.5–15.5)
WBC: 11.8 10*3/uL — ABNORMAL HIGH (ref 4.0–10.5)

## 2015-03-13 LAB — URINE MICROSCOPIC-ADD ON

## 2015-03-13 LAB — PROTIME-INR
INR: 3.16 — ABNORMAL HIGH (ref 0.00–1.49)
Prothrombin Time: 31.8 seconds — ABNORMAL HIGH (ref 11.6–15.2)

## 2015-03-13 LAB — LIPASE, BLOOD: Lipase: 37 U/L (ref 11–51)

## 2015-03-13 MED ORDER — SODIUM CHLORIDE 0.9 % IV BOLUS (SEPSIS)
1000.0000 mL | Freq: Once | INTRAVENOUS | Status: AC
  Administered 2015-03-13: 1000 mL via INTRAVENOUS

## 2015-03-13 MED ORDER — ONDANSETRON HCL 4 MG/2ML IJ SOLN
4.0000 mg | Freq: Once | INTRAMUSCULAR | Status: AC
  Administered 2015-03-13: 4 mg via INTRAVENOUS
  Filled 2015-03-13: qty 2

## 2015-03-13 MED ORDER — ONDANSETRON 8 MG PO TBDP: 8.0000 mg | ORAL_TABLET | Freq: Three times a day (TID) | ORAL | Status: DC | PRN

## 2015-03-13 NOTE — Discharge Instructions (Signed)
Diarrhea  Diarrhea is watery poop (stool). It can make you feel weak, tired, thirsty, or give you a dry mouth (signs of dehydration). Watery poop is a sign of another problem, most often an infection. It often lasts 2-3 days. It can last longer if it is a sign of something serious. Take care of yourself as told by your doctor.  HOME CARE   · Drink 1 cup (8 ounces) of fluid each time you have watery poop.  · Do not drink the following fluids:    Those that contain simple sugars (fructose, glucose, galactose, lactose, sucrose, maltose).    Sports drinks.    Fruit juices.    Whole milk products.    Sodas.    Drinks with caffeine (coffee, tea, soda) or alcohol.  · Oral rehydration solution may be used if the doctor says it is okay. You may make your own solution. Follow this recipe:    ?-? teaspoon table salt.    ¾ teaspoon baking soda.    ? teaspoon salt substitute containing potassium chloride.    1 ? tablespoons sugar.    1 liter (34 ounces) of water.  · Avoid the following foods:    High fiber foods, such as raw fruits and vegetables.    Nuts, seeds, and whole grain breads and cereals.     Those that are sweetened with sugar alcohols (xylitol, sorbitol, mannitol).  · Try eating the following foods:    Starchy foods, such as rice, toast, pasta, low-sugar cereal, oatmeal, baked potatoes, crackers, and bagels.    Bananas.    Applesauce.  · Eat probiotic-rich foods, such as yogurt and milk products that are fermented.  · Wash your hands well after each time you have watery poop.  · Only take medicine as told by your doctor.  · Take a warm bath to help lessen burning or pain from having watery poop.  GET HELP RIGHT AWAY IF:   · You cannot drink fluids without throwing up (vomiting).  · You keep throwing up.  · You have blood in your poop, or your poop looks black and tarry.  · You do not pee (urinate) in 6-8 hours, or there is only a small amount of very dark pee.  · You have belly (abdominal) pain that gets worse or  stays in the same spot (localizes).  · You are weak, dizzy, confused, or light-headed.  · You have a very bad headache.  · Your watery poop gets worse or does not get better.  · You have a fever or lasting symptoms for more than 2-3 days.  · You have a fever and your symptoms suddenly get worse.  MAKE SURE YOU:   · Understand these instructions.  · Will watch your condition.  · Will get help right away if you are not doing well or get worse.     This information is not intended to replace advice given to you by your health care provider. Make sure you discuss any questions you have with your health care provider.     Document Released: 08/21/2007 Document Revised: 03/25/2014 Document Reviewed: 11/10/2011  Elsevier Interactive Patient Education ©2016 [employer]

## 2015-03-13 NOTE — ED Notes (Signed)
Pt reports vomitting and diarrhea that started this morning. Pt states that she has been taking Augmentin. Pt reports 7+ episodes of diarrhea this morning.

## 2015-03-13 NOTE — ED Provider Notes (Signed)
CSN: [id number]     Arrival date & time 03/13/15  1059 History   First MD Initiated Contact with Patient 03/13/15 1302     Chief Complaint  Patient presents with  . Emesis  . Diarrhea   HPI Patient presents to the emergency room with complaints of vomiting and diarrhea. Symptoms initially started with a urinary tract infection approximately one week ago. Patient was started on Augmentin by her primary care doctor because of the resistance pattern of the bacteria. She was doing well though and told this morning where she started to have numerous episodes of diarrhea. The stools have been formed but loose. And began having a few episodes of nausea and vomiting. She denies any trouble with abdominal pain she denies any fevers but she has felt chilled. Past Medical History  Diagnosis Date  . S/P AVR (aortic valve replacement)     with root grafting; #40mm  St. Jude valve conduit; Last Echo 2007  . Hx of CABG 1991    LIMA-LAD, SVG-RCA, SVG-1st and 2ndOM  . Dyslipidemia   . HTN (hypertension)   . Anticoagulant long-term use   . Panic anxiety syndrome   . Giant cell arteritis (HCC)     per aortotomy biopsy in 1991  . CAD (coronary artery disease)   . GERD (gastroesophageal reflux disease)    Past Surgical History  Procedure Laterality Date  . Coronary artery bypass graft    . Aortic valve replacement      with root grafting  . Cholecystectomy     Family History  Problem Relation Age of Onset  . Liver disease Mother   . Pancreatic cancer Father    Social History  Substance Use Topics  . Smoking status: Former Games developer  . Smokeless tobacco: None  . Alcohol Use: No   OB History    No data available     Review of Systems  All other systems reviewed and are negative.     Allergies  Codeine; Lipitor; and Atenolol  Home Medications   Prior to Admission medications   Medication Sig Start Date End Date Taking? Authorizing Provider  acetaminophen (TYLENOL) 325 MG tablet Take  650 mg by mouth every 6 (six) hours as needed for moderate pain.   Yes Historical Provider, MD  citalopram (CELEXA) 20 MG tablet Take 20 mg by mouth daily.    Yes Historical Provider, MD  cycloSPORINE (RESTASIS) 0.05 % ophthalmic emulsion Place 1 drop into both eyes 2 (two) times daily.   Yes Historical Provider, MD  ketotifen (ZADITOR) 0.025 % ophthalmic solution Place 1 drop into both eyes 2 (two) times daily as needed (for allergies).   Yes Historical Provider, MD  warfarin (COUMADIN) 5 MG tablet Take 1.5 tablets by mouth daily or as directed by coumadin clinic Patient taking differently: Take 5-7.5 mg by mouth daily at 6 PM. Takes 7.5mg  on mon, wed, fri  Takes  all other days 08/10/14  Yes Peter M [place], MD  ondansetron (ZOFRAN ODT) 8 MG disintegrating tablet Take 1 tablet (8 mg total) by mouth every 8 (eight) hours as needed for nausea or vomiting. 03/13/15   Linwood Dibbles, MD   BP 129/59 mmHg  Pulse 78  Temp(Src) 98.8 F (37.1 C) (Oral)  Resp 16  SpO2 96% Physical Exam  Constitutional: She appears well-developed and well-nourished. No distress.  HENT:  Head: Normocephalic and atraumatic.  Right Ear: External ear normal.  Left Ear: External ear normal.  Eyes: Conjunctivae are normal. Right eye  exhibits no discharge. Left eye exhibits no discharge. No scleral icterus.  Neck: Neck supple. No tracheal deviation present.  Cardiovascular: Normal rate, regular rhythm and intact distal pulses.   Pulmonary/Chest: Effort normal and breath sounds normal. No stridor. No respiratory distress. She has no wheezes. She has no rales.  Abdominal: Soft. Bowel sounds are normal. She exhibits no distension. There is no tenderness. There is no rebound and no guarding.  Musculoskeletal: She exhibits no edema or tenderness.  Neurological: She is alert. She has normal strength. No cranial nerve deficit (no facial droop, extraocular movements intact, no slurred speech) or sensory deficit. She exhibits normal  muscle tone. She displays no seizure activity. Coordination normal.  Skin: Skin is warm and dry. No rash noted.  Psychiatric: She has a normal mood and affect.  Nursing note and vitals reviewed.   ED Course  Procedures (including critical care time) Labs Review Labs Reviewed  COMPREHENSIVE METABOLIC PANEL - Abnormal; Notable for the following:    Glucose, Bld 140 (*)    GFR calc non Af Amer 59 (*)    All other components within normal limits  CBC - Abnormal; Notable for the following:    WBC 11.8 (*)    All other components within normal limits  URINALYSIS, ROUTINE W REFLEX MICROSCOPIC (NOT AT [facility]) - Abnormal; Notable for the following:    Hgb urine dipstick TRACE (*)    All other components within normal limits  PROTIME-INR - Abnormal; Notable for the following:    Prothrombin Time 31.8 (*)    INR 3.16 (*)    All other components within normal limits  URINE MICROSCOPIC-ADD ON - Abnormal; Notable for the following:    Squamous Epithelial / LPF 0-5 (*)    Bacteria, UA RARE (*)    All other components within normal limits  C DIFFICILE QUICK SCREEN W PCR REFLEX  LIPASE, BLOOD    Medications  sodium chloride 0.9 % bolus 1,000 mL (0 mLs Intravenous Stopped 03/13/15 1551)  ondansetron (ZOFRAN) injection 4 mg (4 mg Intravenous Given 03/13/15 1351)     MDM   Final diagnoses:  Vomiting and diarrhea    She'll symptoms improved after IV fluids and Zofran. She has not had any further episodes of diarrhea. She has not had anymore vomiting.  I suspect her symptoms may be related to a viral illness. I considered Clostridium difficile with her recent antibiotics however she was not able to provide a stool specimen. She's only had less than 24 hours his symptoms. I do not feel that treatment is necessary at this point. We discussed supportive care and outpatient follow-up with her primary doctor.    Linwood DibblesJon Jonavan Vanhorn, MD 03/13/15 [phone]

## 2015-03-13 NOTE — ED Notes (Signed)
Pt home with husband after verbalizes understanding of instructions.

## 2015-04-07 ENCOUNTER — Ambulatory Visit (INDEPENDENT_AMBULATORY_CARE_PROVIDER_SITE_OTHER): Payer: Medicare Other | Admitting: Pharmacist Clinician (PhC)/ Clinical Pharmacy Specialist

## 2015-04-07 DIAGNOSIS — I359 Nonrheumatic aortic valve disorder, unspecified: Secondary | ICD-10-CM

## 2015-04-07 DIAGNOSIS — Z7901 Long term (current) use of anticoagulants: Secondary | ICD-10-CM | POA: Diagnosis not present

## 2015-04-07 LAB — POCT INR: INR: 2.9

## 2015-05-02 ENCOUNTER — Ambulatory Visit (INDEPENDENT_AMBULATORY_CARE_PROVIDER_SITE_OTHER): Payer: Medicare Other | Admitting: Cardiology

## 2015-05-02 ENCOUNTER — Encounter: Payer: Self-pay | Admitting: Cardiology

## 2015-05-02 ENCOUNTER — Ambulatory Visit (INDEPENDENT_AMBULATORY_CARE_PROVIDER_SITE_OTHER): Payer: Medicare Other | Admitting: Pharmacist Clinician (PhC)/ Clinical Pharmacy Specialist

## 2015-05-02 VITALS — BP 148/72 | HR 63 | Ht 66.0 in | Wt 159.5 lb

## 2015-05-02 DIAGNOSIS — Z954 Presence of other heart-valve replacement: Secondary | ICD-10-CM

## 2015-05-02 DIAGNOSIS — Z7901 Long term (current) use of anticoagulants: Secondary | ICD-10-CM | POA: Diagnosis not present

## 2015-05-02 DIAGNOSIS — Z952 Presence of prosthetic heart valve: Secondary | ICD-10-CM

## 2015-05-02 DIAGNOSIS — I10 Essential (primary) hypertension: Secondary | ICD-10-CM

## 2015-05-02 DIAGNOSIS — I359 Nonrheumatic aortic valve disorder, unspecified: Secondary | ICD-10-CM

## 2015-05-02 DIAGNOSIS — Z951 Presence of aortocoronary bypass graft: Secondary | ICD-10-CM | POA: Diagnosis not present

## 2015-05-02 DIAGNOSIS — E785 Hyperlipidemia, unspecified: Secondary | ICD-10-CM

## 2015-05-02 LAB — LIPID PANEL
CHOLESTEROL: 189 mg/dL (ref 125–200)
HDL: 41 mg/dL — AB (ref >=46)
LDL Cholesterol: 111 mg/dL (ref <130)
TRIGLYCERIDES: 183 mg/dL — AB (ref <150)
Total CHOL/HDL Ratio: 4.6 Ratio (ref <=5.0)
VLDL: 37 mg/dL — ABNORMAL HIGH (ref <30)

## 2015-05-02 LAB — HEPATIC FUNCTION PANEL
ALK PHOS: 86 U/L (ref 33–130)
ALT: 36 U/L — AB (ref 6–29)
AST: 34 U/L (ref 10–35)
Albumin: 3.8 g/dL (ref 3.6–5.1)
BILIRUBIN INDIRECT: 0.8 mg/dL (ref 0.2–1.2)
Bilirubin, Direct: 0.2 mg/dL (ref <=0.2)
TOTAL PROTEIN: 6.7 g/dL (ref 6.1–8.1)
Total Bilirubin: 1 mg/dL (ref 0.2–1.2)

## 2015-05-02 LAB — BASIC METABOLIC PANEL
BUN: 8 mg/dL (ref 7–25)
CO2: 30 mmol/L (ref 20–31)
CREATININE: 0.82 mg/dL (ref 0.60–0.93)
Calcium: 9.3 mg/dL (ref 8.6–10.4)
Chloride: 106 mmol/L (ref 98–110)
Glucose, Bld: 92 mg/dL (ref 65–99)
POTASSIUM: 4.3 mmol/L (ref 3.5–5.3)
Sodium: 141 mmol/L (ref 135–146)

## 2015-05-02 LAB — POCT INR: INR: 2.5

## 2015-05-02 NOTE — Progress Notes (Signed)
Tamara Patterson Date of Birth: 1940-09-17   History of Present Illness: Tamara Patterson is seen today for followup. 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 This included mechanical aortic valve replacement and grafting of the aortic root with a #23 mm St. Jude valve conduit. She also had coronary bypass surgery including an IMA graft to the LAD, saphenous vein graft to the right coronary, and saphenous vein graft to the first and second obtuse marginal vessels. 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 She was treated with pulse steroids for a period of time and was weaned off of this.  On follow up today she is doing very well. No chest pain or SOB. She did develop C. Diff colitis over the holiday after being treated for a UTI.  She has been doing much better with diet and wants her cholesterol rechecked. She has been intolerant of statins and Zetia due to myalgias. Also intolerant of Welchol due to hypoglycemia.   Current Outpatient Prescriptions on File Prior to Visit  Medication Sig Dispense Refill  . citalopram (CELEXA) 20 MG tablet Take 20 mg by mouth daily.     Marland Kitchen ketotifen (ZADITOR) 0.025 % ophthalmic solution Place 1 drop into both eyes 2 (two) times daily as needed (for allergies).    . warfarin (COUMADIN) 5 MG tablet Take 1.5 tablets by mouth daily or as directed by coumadin clinic (Patient taking differently: Take 5-7.5 mg by mouth daily at 6 PM. Takes 7.5mg  on mon, wed, fri  Takes  all other days) 135 tablet 1   No current facility-administered medications on file prior to visit.    Allergies  Allergen Reactions  . Codeine Nausea Only  . Lipitor [Atorvastatin Calcium] Other (See Comments)    Caused muscle aching  . Atenolol Other (See Comments)    cramps    Past Medical History  Diagnosis Date  . S/P AVR (aortic valve replacement)     with root grafting; #41mm  St. Jude valve conduit; Last  Echo 2007  . Hx of CABG 1991    LIMA-LAD, SVG-RCA, SVG-1st and 2ndOM  . Dyslipidemia   . HTN (hypertension)   . Anticoagulant long-term use   . Panic anxiety syndrome   . Giant cell arteritis (HCC)     per aortotomy biopsy in 1991  . CAD (coronary artery disease)   . GERD (gastroesophageal reflux disease)     Past Surgical History  Procedure Laterality Date  . Coronary artery bypass graft    . Aortic valve replacement      with root grafting  . Cholecystectomy      History  Smoking status  . Former Smoker  Smokeless tobacco  . Not on file    History  Alcohol Use No    Family History  Problem Relation Age of Onset  . Liver disease Mother   . Pancreatic cancer Father     Review of Systems: As noted in history of present illness. All other systems were reviewed and are negative.  Physical Exam: BP 148/72 mmHg  Pulse 63  Ht  (1.676 m)  Wt 72.349 kg (159 lb 8 oz)  BMI 25.76 kg/m2 The patient is alert and oriented x 3.   The skin is warm and dry. The HEENT exam is normal.  The carotids are 2+ without bruits.  There is no thyromegaly.  There is no JVD.  The lungs are  clear.    The heart exam reveals a regular rate with a normal Mechanical aortic valve click.  There are no murmurs, gallops, or rubs.  The PMI is not displaced.   Abdominal exam reveals good bowel sounds.  There is no guarding or rebound.  There is no hepatosplenomegaly or tenderness.  There are no masses.  Exam of the legs reveal no clubbing, cyanosis, or edema.  The legs are without rashes.  The distal pulses are intact.  Cranial nerves II - XII are intact.  Motor and sensory functions are intact.  The gait is normal.  LABORATORY DATA: INR today is 2.5  ECG today demonstrates normal sinus rhythm with a rate of 63 beats per minute. 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 LVH. No change. I have personally reviewed and interpreted this study.    Assessment / Plan: 1.  Status post mechanical aortic valve replacement.  Exam is stable. INR is therapeutic.  2. Status post CABG for ostial coronary disease. No angina  3. Hyperlipidemia. Will check a lipid panel today. Encourage dietary modification. Discussed maybe trying crestor 5 mg twice a week with CoQ10.   4. Right bundle branch block/LAFB. She is asymptomatic.

## 2015-05-02 NOTE — Patient Instructions (Signed)
Continue your current therapy  Try and get back into exercise   We will check lab work today.

## 2015-05-03 ENCOUNTER — Other Ambulatory Visit: Payer: Self-pay | Admitting: Cardiology

## 2015-05-03 ENCOUNTER — Encounter: Payer: Medicare Other | Admitting: Pharmacist Clinician (PhC)/ Clinical Pharmacy Specialist

## 2015-06-12 ENCOUNTER — Encounter: Payer: Medicare Other | Admitting: Pharmacist Clinician (PhC)/ Clinical Pharmacy Specialist

## 2015-06-12 ENCOUNTER — Ambulatory Visit (INDEPENDENT_AMBULATORY_CARE_PROVIDER_SITE_OTHER): Payer: Medicare Other | Admitting: Pharmacist Clinician (PhC)/ Clinical Pharmacy Specialist

## 2015-06-12 DIAGNOSIS — I359 Nonrheumatic aortic valve disorder, unspecified: Secondary | ICD-10-CM | POA: Diagnosis not present

## 2015-06-12 DIAGNOSIS — Z7901 Long term (current) use of anticoagulants: Secondary | ICD-10-CM

## 2015-06-12 LAB — POCT INR: INR: 1.2

## 2015-06-20 ENCOUNTER — Encounter: Payer: Self-pay | Admitting: Cardiology

## 2015-06-26 ENCOUNTER — Encounter: Payer: Medicare Other | Admitting: Pharmacist Clinician (PhC)/ Clinical Pharmacy Specialist

## 2015-06-29 ENCOUNTER — Ambulatory Visit (INDEPENDENT_AMBULATORY_CARE_PROVIDER_SITE_OTHER): Payer: Medicare Other | Admitting: Pharmacist Clinician (PhC)/ Clinical Pharmacy Specialist

## 2015-06-29 DIAGNOSIS — Z7901 Long term (current) use of anticoagulants: Secondary | ICD-10-CM

## 2015-06-29 DIAGNOSIS — I359 Nonrheumatic aortic valve disorder, unspecified: Secondary | ICD-10-CM | POA: Diagnosis not present

## 2015-06-29 LAB — POCT INR: INR: 3.1

## 2015-07-17 ENCOUNTER — Ambulatory Visit (INDEPENDENT_AMBULATORY_CARE_PROVIDER_SITE_OTHER): Payer: Medicare Other | Admitting: Pharmacist

## 2015-07-17 DIAGNOSIS — Z7901 Long term (current) use of anticoagulants: Secondary | ICD-10-CM | POA: Diagnosis not present

## 2015-07-17 DIAGNOSIS — I359 Nonrheumatic aortic valve disorder, unspecified: Secondary | ICD-10-CM | POA: Diagnosis not present

## 2015-07-17 LAB — POCT INR: INR: 2.9

## 2015-08-15 ENCOUNTER — Ambulatory Visit (INDEPENDENT_AMBULATORY_CARE_PROVIDER_SITE_OTHER): Payer: Medicare Other | Admitting: Pharmacist

## 2015-08-15 DIAGNOSIS — Z7901 Long term (current) use of anticoagulants: Secondary | ICD-10-CM

## 2015-08-15 DIAGNOSIS — I359 Nonrheumatic aortic valve disorder, unspecified: Secondary | ICD-10-CM | POA: Diagnosis not present

## 2015-08-15 LAB — POCT INR: INR: 3.3

## 2015-08-24 IMAGING — CR DG ABDOMEN ACUTE W/ 1V CHEST
3 series · 3 of 3 positions shown · non-contrast
Comparison: 11/15/2011 chest film.

CLINICAL DATA: Upper abdominal pain. Nausea and vomiting. Diarrhea.
CABG.

EXAM:
ACUTE ABDOMEN SERIES (ABDOMEN 2 VIEW & CHEST 1 VIEW)

[w chest pa]
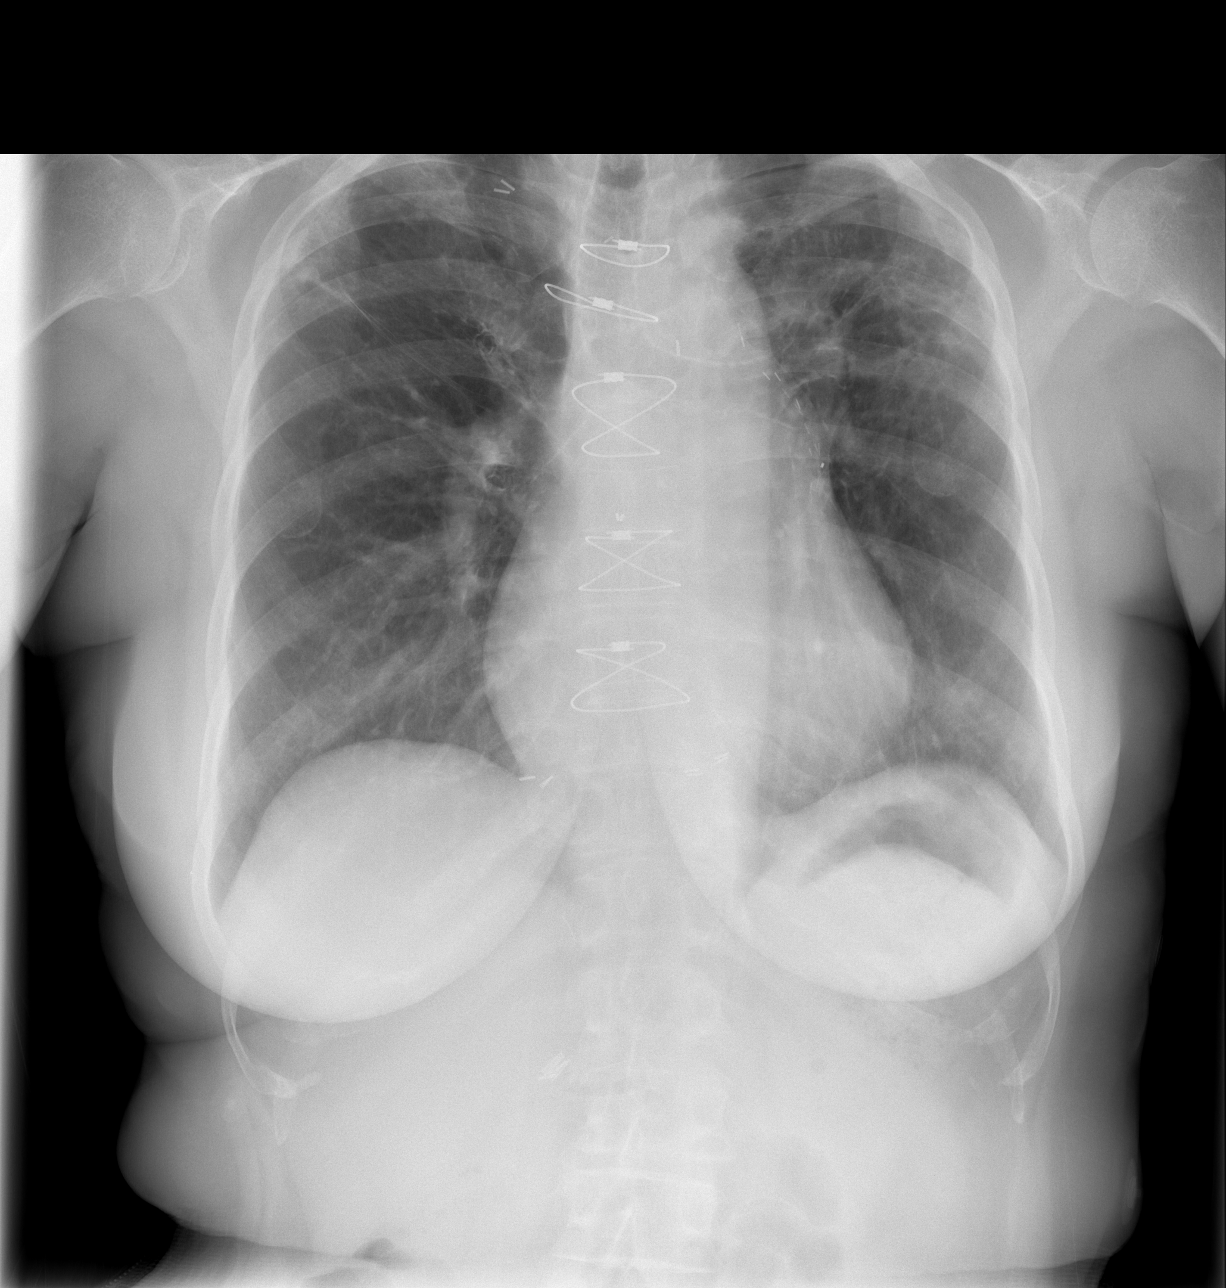

[w abdomen upright]
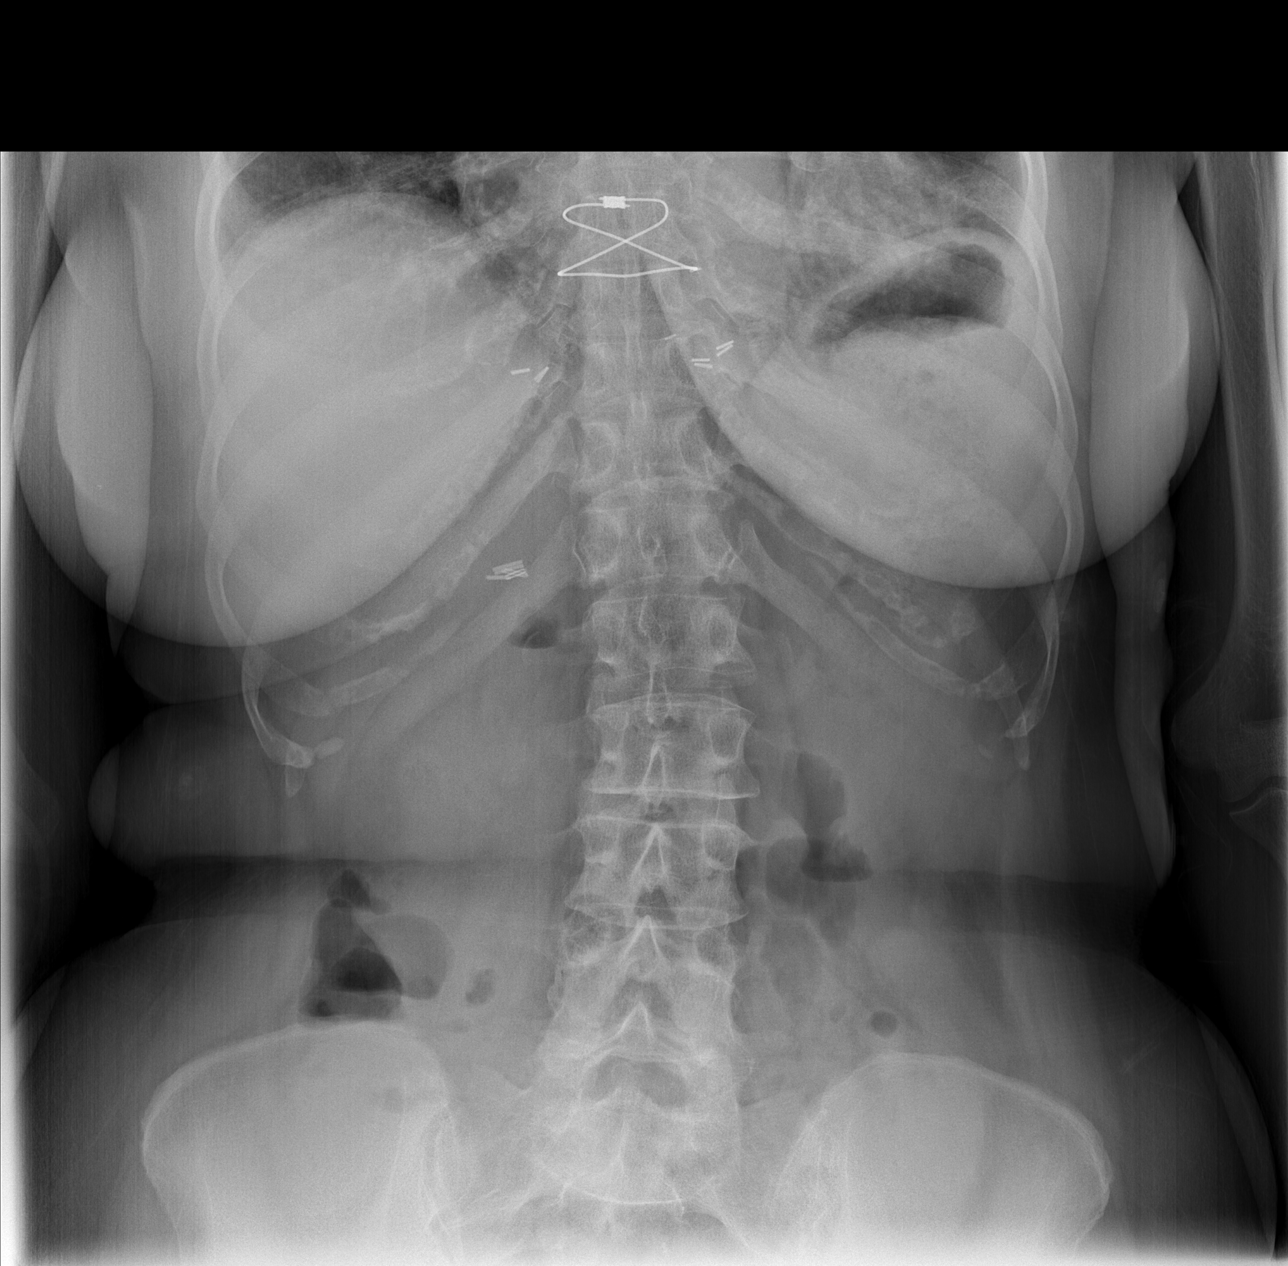

[t abdomen supine]
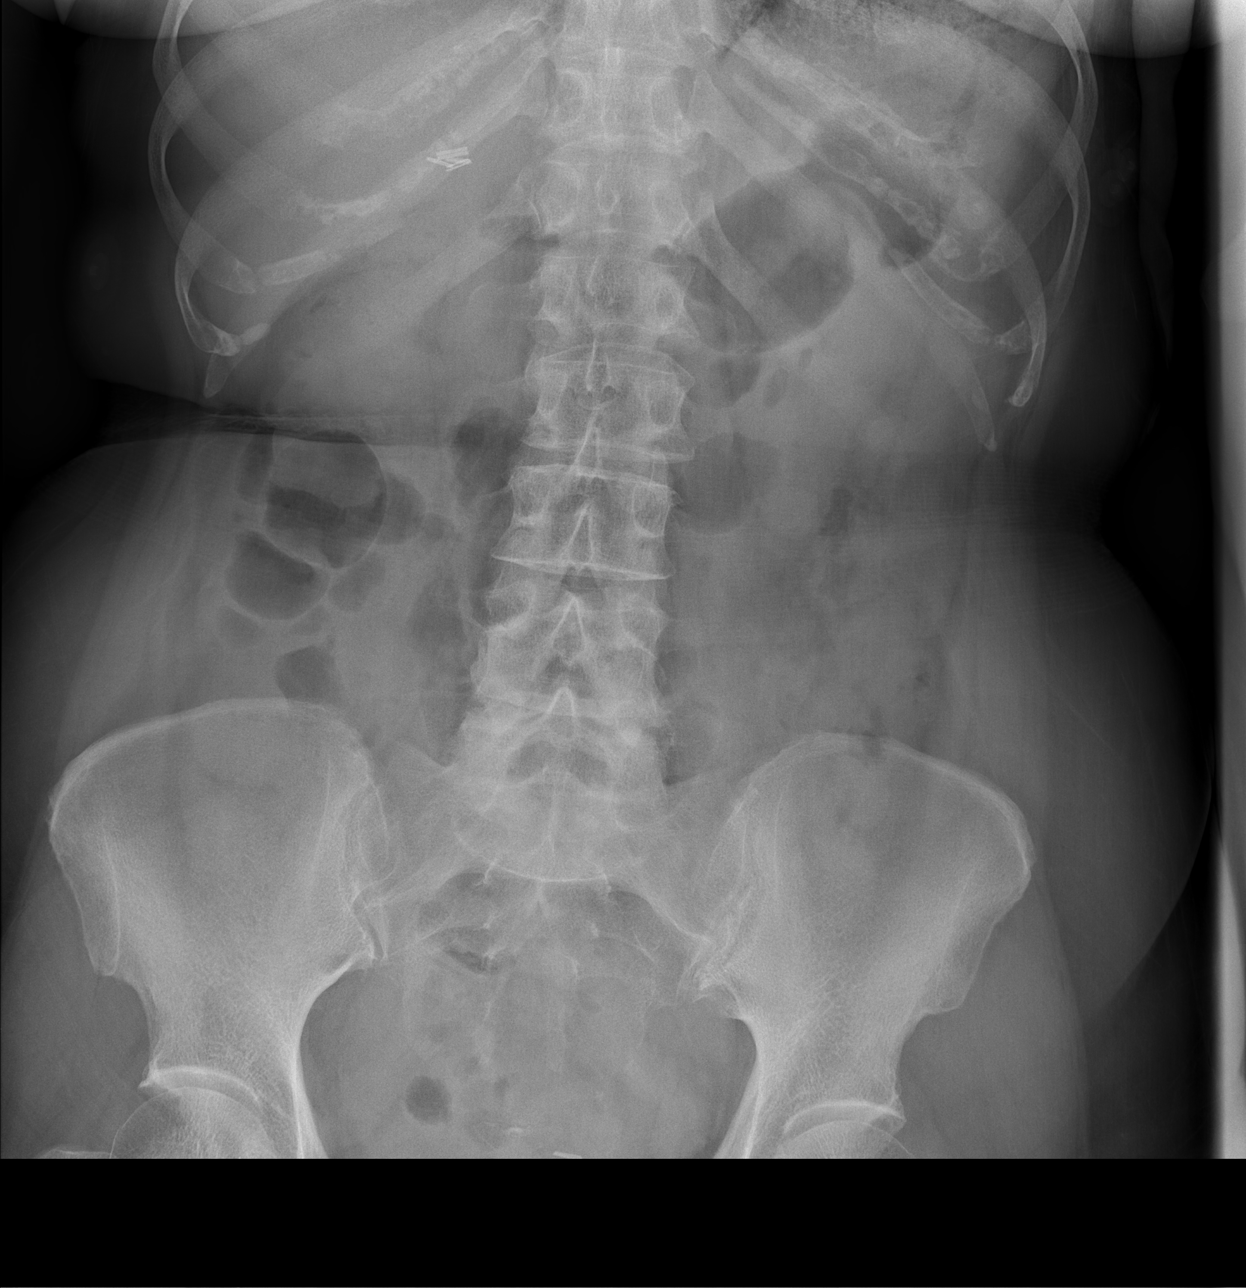

[3 of 3 positions shown; findings below may reference images not displayed]

FINDINGS: Frontal view of the chest demonstrates midline trachea. Normal heart
size. No pleural effusion or pneumothorax. Prior median sternotomy.
Upper lobe predominant architectural distortion and volume loss is
not significantly changed. There is also mild scarring at the left
lung base. No lobar consolidation.

Abdominal films demonstrate cholecystectomy clips. No free
intraperitoneal air. Minimal small bowel air-fluid levels within the
mid abdomen. No bowel distension on supine imaging. Distal gas.
IMPRESSION: Nonspecific mild small bowel air-fluid levels, without evidence of
obstruction or free intraperitoneal air.

Cardiomegaly with similar appearance of upper lobe predominant
architectural distortion and volume loss. Considerations include
this sequelae of prior atypical infection, including tuberculosis,
and chronic sarcoidosis.

## 2015-09-20 ENCOUNTER — Ambulatory Visit (INDEPENDENT_AMBULATORY_CARE_PROVIDER_SITE_OTHER): Payer: Medicare Other | Admitting: Pharmacist

## 2015-09-20 DIAGNOSIS — Z7901 Long term (current) use of anticoagulants: Secondary | ICD-10-CM | POA: Diagnosis not present

## 2015-09-20 DIAGNOSIS — I359 Nonrheumatic aortic valve disorder, unspecified: Secondary | ICD-10-CM | POA: Diagnosis not present

## 2015-09-20 LAB — POCT INR: INR: 3.5

## 2015-09-27 ENCOUNTER — Telehealth: Payer: Self-pay | Admitting: Pharmacist

## 2015-09-27 ENCOUNTER — Other Ambulatory Visit: Payer: Self-pay | Admitting: Cardiology

## 2015-09-27 MED ORDER — WARFARIN SODIUM 5 MG PO TABS: ORAL_TABLET | ORAL | Status: DC

## 2015-09-27 NOTE — Telephone Encounter (Signed)
Pt called because she is at the pharmacy and needs refill of warfarin. She is going out of town and needs this medication before she leaves. Have sent refills.

## 2015-10-25 ENCOUNTER — Ambulatory Visit (INDEPENDENT_AMBULATORY_CARE_PROVIDER_SITE_OTHER): Payer: Medicare Other | Admitting: Cardiology

## 2015-10-25 ENCOUNTER — Encounter: Payer: Self-pay | Admitting: Cardiology

## 2015-10-25 ENCOUNTER — Other Ambulatory Visit: Payer: Self-pay

## 2015-10-25 DIAGNOSIS — Z951 Presence of aortocoronary bypass graft: Secondary | ICD-10-CM | POA: Diagnosis not present

## 2015-10-25 DIAGNOSIS — I10 Essential (primary) hypertension: Secondary | ICD-10-CM

## 2015-10-25 DIAGNOSIS — R002 Palpitations: Secondary | ICD-10-CM | POA: Diagnosis not present

## 2015-10-25 DIAGNOSIS — Z954 Presence of other heart-valve replacement: Secondary | ICD-10-CM | POA: Diagnosis not present

## 2015-10-25 DIAGNOSIS — E785 Hyperlipidemia, unspecified: Secondary | ICD-10-CM

## 2015-10-25 DIAGNOSIS — Z7901 Long term (current) use of anticoagulants: Secondary | ICD-10-CM

## 2015-10-25 DIAGNOSIS — M316 Other giant cell arteritis: Secondary | ICD-10-CM | POA: Diagnosis not present

## 2015-10-25 DIAGNOSIS — Z952 Presence of prosthetic heart valve: Secondary | ICD-10-CM

## 2015-10-25 MED ORDER — METOPROLOL TARTRATE 25 MG PO TABS: 12.5000 mg | ORAL_TABLET | Freq: Two times a day (BID) | ORAL | 3 refills | Status: DC

## 2015-10-25 MED ORDER — WARFARIN SODIUM 5 MG PO TABS: ORAL_TABLET | ORAL | 1 refills | Status: DC

## 2015-10-25 NOTE — Assessment & Plan Note (Signed)
LIMA-LAD, SVG-RCA, SVG-1st and 2ndOM

## 2015-10-25 NOTE — Assessment & Plan Note (Signed)
per aortotomy biopsy in 1991

## 2015-10-25 NOTE — Assessment & Plan Note (Signed)
Coumadin Rx 

## 2015-10-25 NOTE — Progress Notes (Signed)
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 She underwent open heart surgery in 1991. This included mechanical aortic valve replacement and grafting of the aortic root with a #23 mm St. Jude valve conduit. She also had coronary bypass surgery including an IMA graft to the LAD, saphenous vein graft to the right coronary, and saphenous vein graft to the first and second obtuse marginal vessels. 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 She was treated with pulse steroids for a period of time and was weaned off of this. She has been intolerant of statins and Zetia due to myalgias, and intolerant of Welchol due to hypoglycemia. She is her for a 6 month check up. She complained of palpitations. She describes intermittent beats and "fluttering" that lasts seconds. No sustained tachycardia. She thinks its related to GERD and she thinks her symptoms are better since she started taking Protonix.    Current Outpatient Prescriptions  Medication Sig Dispense Refill  . amoxicillin (AMOXIL) 875 MG tablet Take 875 mg by mouth as needed for pain.    Marland Kitchen. Apoaequorin (PREVAGEN PO) Take 1 capsule by mouth daily.    . citalopram (CELEXA) 20 MG tablet Take 20 mg by mouth daily.     Marland Kitchen. ketotifen (ZADITOR) 0.025 % ophthalmic solution Place 1 drop into both eyes 2 (two) times daily as needed (for allergies).    Marland Kitchen. MAGNESIUM MALATE PO Take 2 capsules by mouth daily.    . Omega-3 Fatty Acids (FISH OIL) 1000 MG CAPS Take 2,000 mg by mouth daily.    . pantoprazole (PROTONIX) 20 MG tablet Take 20 mg by mouth daily.    Marland Kitchen. warfarin (COUMADIN) 5 MG tablet TAKE 1.5 TABLETS BY MOUTH ONCE DAILY OR  AS  DIRECTED  BY  THE  COUMADIN  CLINIC 135 tablet 1   No current facility-administered medications for this visit.      Allergies  Allergen Reactions  . Codeine Nausea Only  . Lipitor [Atorvastatin Calcium] Other (See Comments)    Caused muscle aching  . Atenolol Other (See Comments)    cramps    Social History   Social History  . Marital status: Married    Spouse name: N/A  . Number of children: 0  . Years of education: N/A   Occupational History  . social work Retired    retired   Social History Main Topics  . Smoking status: Former Games developermoker  . Smokeless tobacco: Not on file  . Alcohol use No  . Drug use: No  . Sexual activity: Not on file   Other Topics Concern  . Not on file   Social History Narrative  . No narrative on file     Review of Systems: General: negative for chills, fever, night sweats or weight changes.  Cardiovascular: negative for chest pain, dyspnea on exertion, edema, orthopnea, palpitations, paroxysmal nocturnal dyspnea or shortness of breath Dermatological: negative for rash Respiratory: negative for wheezing, positive for dry cough Urologic: negative for hematuria Abdominal: negative for nausea, vomiting, diarrhea, bright red blood per rectum, melena, or hematemesis Neurologic: negative for visual changes, syncope, or dizziness All other systems reviewed and are otherwise negative except as noted above.    Blood pressure 130/60, pulse 60, height 5\' 6"  (1.676 m), weight 157 lb 12.8 oz (  71.6 kg).  General appearance: alert, cooperative and no distress Neck: no carotid bruit and no JVD Lungs: clear to auscultation bilaterally Heart: regular rate and rhythm and positive valve sounds Extremities: extremities normal, atraumatic, no cyanosis or edema Skin: Skin color, texture, turgor normal. No rashes or lesions Neurologic: Grossly normal  EKG NSR-60, RBBB, LAFB  ASSESSMENT AND PLAN:   Hx of CABG-'91 LIMA-LAD, SVG-RCA, SVG-1st and 2ndOM  S/P St Jude AVR '91 with root grafting; #64mm  St. Jude valve conduit  Long term current use of  anticoagulant Coumadin Rx  Giant cell arteritis per aortotomy biopsy in 1991  HTN (hypertension) Controlled  Dyslipidemia Statin intolerant  Palpitations Seen in the office with complaints of palpiatations. She describes skipped beats and "fluttering " that lasts seconds. She was intolerant to Atenolol previously.    PLAN  Metoprolol 12.5 mg BID PRN. F/U with dr Swaziland in 6 months. She asked about a stress test but I got no history worrisome for angina. Having said that if she has any chest discomfort that sounds ,more typical I would have a low threshold to get a Myoview.   Corine Shelter PA-C 10/25/2015 2:43 PM

## 2015-10-25 NOTE — Assessment & Plan Note (Signed)
Statin intolerant 

## 2015-10-25 NOTE — Assessment & Plan Note (Signed)
with root grafting; #8623mm  St. Jude valve conduit

## 2015-10-25 NOTE — Assessment & Plan Note (Signed)
Seen in the office with complaints of palpiatations. She describes skipped beats and "fluttering " that lasts seconds. She was intolerant to Atenolol previously.

## 2015-10-25 NOTE — Patient Instructions (Signed)
Medication Instructions:  START Lopressor 12.5mg , take 1 tab by mouth twice a day as needed for Palpitations  Labwork: None Ordered  Testing/Procedures: None Ordered  Follow-Up: Your physician wants you to follow-up in: 6 MONTHS WITH DR SwazilandJORDAN. You will receive a reminder letter in the mail two months in advance. If you don't receive a letter, please call our office to schedule the follow-up appointment.   Any Other Special Instructions Will Be Listed Below (If Applicable).     If you need a refill on your cardiac medications before your next appointment, please call your pharmacy.

## 2015-10-25 NOTE — Assessment & Plan Note (Signed)
Controlled.  

## 2015-11-02 ENCOUNTER — Ambulatory Visit (INDEPENDENT_AMBULATORY_CARE_PROVIDER_SITE_OTHER): Payer: Medicare Other | Admitting: Pharmacist

## 2015-11-02 DIAGNOSIS — Z7901 Long term (current) use of anticoagulants: Secondary | ICD-10-CM | POA: Diagnosis not present

## 2015-11-02 DIAGNOSIS — I359 Nonrheumatic aortic valve disorder, unspecified: Secondary | ICD-10-CM

## 2015-11-02 LAB — POCT INR: INR: 3

## 2015-11-07 ENCOUNTER — Telehealth: Payer: Self-pay | Admitting: Cardiology

## 2015-11-07 NOTE — Telephone Encounter (Signed)
Pt called stating she was having palpitations again today. Denies any other associated symptoms during the time of the phone call. In review of last office note she was given metoprolol 12.5mg  BID to use PRN for palpitations. Reports he HR is currently in th 70s, stable BP. I advised that she take a dose of BB and see if this helps with her symptoms tonight. ER precautions given related to angina and dyspnea.

## 2015-11-10 ENCOUNTER — Telehealth: Payer: Self-pay | Admitting: Cardiology

## 2015-11-10 NOTE — Telephone Encounter (Signed)
Returned call. Meds as listed. Pt w history of open heart surgery x2 (AVR, CABG), palpitations, noted RBBB on EKG.  She reports concern bc she saw Franky MachoLuke recently, still has occasional palpitations, reluctant to take BB if HR is low, notes "at times" HR is below 60 - she states concern about this.  She states her typical readings are btw ~55-70.   She denies any chest pain, lightheadedness, fatigue, SOB, etc.   We discussed use of the metoprolol, she notes she takes PRN as instructed & hasn't really needed to use this recently, notes sometimes she uses w/ no discernable change, or heavier, slower beats. Noted this was possibly associated w/ GERD -she had been prescribed protonix to treat, but pt unsure if this has been helping.  Pt seeking recommendation from provider, informed her I would send for Dr. Elvis CoilJordan's review - she is to f/u w him in 6 months unless advised to return sooner.

## 2015-11-10 NOTE — Telephone Encounter (Signed)
New message      Pt saw the PA on 10-25-15 with palpitations and low heart rate.  She is still having the same problem----HR dropping in the mid 50's.  She is having open heart surgery soon and is concerned.  Please call

## 2015-11-11 NOTE — Telephone Encounter (Signed)
Agree with using metoprolol on a prn basis only. I think palpitations are benign.  Peter SwazilandJordan MD, [facility]

## 2015-11-14 NOTE — Telephone Encounter (Signed)
Reviewed recommendations w patient. She expressed understanding and thanks. She was very appreciative of advice. She is aware to call if she has new concerns.

## 2015-12-14 ENCOUNTER — Ambulatory Visit (INDEPENDENT_AMBULATORY_CARE_PROVIDER_SITE_OTHER): Payer: Medicare Other | Admitting: Pharmacist Clinician (PhC)/ Clinical Pharmacy Specialist

## 2015-12-14 DIAGNOSIS — Z7901 Long term (current) use of anticoagulants: Secondary | ICD-10-CM | POA: Diagnosis not present

## 2015-12-14 DIAGNOSIS — I359 Nonrheumatic aortic valve disorder, unspecified: Secondary | ICD-10-CM

## 2015-12-14 LAB — POCT INR: INR: 3.4

## 2016-01-22 ENCOUNTER — Ambulatory Visit (INDEPENDENT_AMBULATORY_CARE_PROVIDER_SITE_OTHER): Payer: Medicare Other | Admitting: Pharmacist Clinician (PhC)/ Clinical Pharmacy Specialist

## 2016-01-22 DIAGNOSIS — Z7901 Long term (current) use of anticoagulants: Secondary | ICD-10-CM

## 2016-01-22 DIAGNOSIS — I359 Nonrheumatic aortic valve disorder, unspecified: Secondary | ICD-10-CM

## 2016-01-22 LAB — POCT INR: INR: 3.1

## 2016-02-23 ENCOUNTER — Ambulatory Visit: Payer: Medicare Other | Admitting: Cardiology

## 2016-03-11 NOTE — Progress Notes (Signed)
Tamara Patterson Date of Birth: August 22, 1940   History of Present Illness: Tamara Patterson is seen today for followup. 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 This included mechanical aortic valve replacement and grafting of the aortic root with a #23 mm St. Jude valve conduit. She also had coronary bypass surgery including an IMA graft to the LAD, saphenous vein graft to the right coronary, and saphenous vein graft to the first and second obtuse marginal vessels. 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 She was treated with pulse steroids for a period of time and was weaned off of this. Last Myoview study was in 2008. Echo in 2011 was satisfactory.  On follow up today she is doing very well. No chest pain or SOB.   She has been intolerant of statins and Zetia due to myalgias. Also intolerant of Welchol due to hypoglycemia. She did have a lot of palpitations this past August/September. This apparently related to a dental device she was using to straighten her teeth that used vibration. She stopped using it and it resolved.  [facility] with ABI 0.9 on the right and 0.77 on the left. She has no claudication symptoms. She is interested in resuming maintenance Cardiac Rehab.   Current Outpatient Prescriptions on File Prior to Visit  Medication Sig Dispense Refill  . citalopram (CELEXA) 20 MG tablet Take 20 mg by mouth daily.     Marland Kitchen. ketotifen (ZADITOR) 0.025 % ophthalmic solution Place 1 drop into both eyes 2 (two) times daily as needed (for allergies).    Marland Kitchen. MAGNESIUM MALATE PO Take 2 capsules by mouth daily.    . nitrofurantoin (MACRODANTIN) 100 MG capsule Take 100 mg by mouth at bedtime.    . Omega-3 Fatty Acids (FISH OIL) 1000 MG CAPS Take 2,000 mg by mouth daily.    . pantoprazole (PROTONIX) 20 MG tablet Take 20 mg by mouth daily as needed.     . warfarin  (COUMADIN) 5 MG tablet TAKE 1.5 TABLETS BY MOUTH ONCE DAILY OR  AS  DIRECTED  BY  THE  COUMADIN  CLINIC 135 tablet 1   No current facility-administered medications on file prior to visit.     Allergies  Allergen Reactions  . Codeine Nausea Only  . Lipitor [Atorvastatin Calcium] Other (See Comments)    Caused muscle aching  . Atenolol Other (See Comments)    cramps    Past Medical History:  Diagnosis Date  . Anticoagulant long-term use   . CAD (coronary artery disease)   . Dyslipidemia   . GERD (gastroesophageal reflux disease)   . Giant cell arteritis (HCC)    per aortotomy biopsy in 1991  . HTN (hypertension)   . Hx of CABG 1991   LIMA-LAD, SVG-RCA, SVG-1st and 2ndOM  . Panic anxiety syndrome   . S/P AVR (aortic valve replacement)    with root grafting; #7223mm  St. Jude valve conduit; Last Echo 2007    Past Surgical History:  Procedure Laterality Date  . AORTIC VALVE REPLACEMENT     with root grafting  . CHOLECYSTECTOMY    . CORONARY ARTERY BYPASS GRAFT      History  Smoking Status  . Former Smoker  Smokeless Tobacco  . Never Used    History  Alcohol Use No    Family History  Problem Relation Age of Onset  .  Liver disease Mother   . Pancreatic cancer Father     Review of Systems: As noted in history of present illness. All other systems were reviewed and are negative.  Physical Exam: BP 140/78   Pulse 74   Ht 5\' 6"  (1.676 m)   Wt 162 lb (73.5 kg)   BMI 26.15 kg/m  The patient is alert and oriented x 3.   The skin is warm and dry. The HEENT exam is normal.  The carotids are 2+ without bruits.  There is no thyromegaly.  There is no JVD.  The lungs are clear.    The heart exam reveals a regular rate with a normal Mechanical aortic valve click.  There are no murmurs, gallops, or rubs.  The PMI is not displaced.   Abdominal exam reveals good bowel sounds.  There is no guarding or rebound.  There is no hepatosplenomegaly or tenderness.  There are no masses.   Exam of the legs reveal no clubbing, cyanosis, or edema.  The legs are without rashes.  The distal pulses are 2+ right DP and PT. Left DP is 2+ but PT is diminished. Femoral pulses are 2+ without bruits.  Cranial nerves II - XII are intact.  Motor and sensory functions are intact.  The gait is normal.  LABORATORY DATA:  Lab Results  Component Value Date   WBC 11.8 (H) 03/13/2015   HGB 12.6 03/13/2015   HCT 38.6 03/13/2015   PLT 194 03/13/2015   GLUCOSE 92 05/02/2015   CHOL 189 05/02/2015   TRIG 183 (H) 05/02/2015   HDL 41 (L) 05/02/2015   LDLDIRECT 126.4 10/07/2012   LDLCALC 111 05/02/2015   ALT 36 (H) 05/02/2015   AST 34 05/02/2015   NA 141 05/02/2015   K 4.3 05/02/2015   CL 106 05/02/2015   CREATININE 0.82 05/02/2015   BUN 8 05/02/2015   CO2 30 05/02/2015   INR 3.1 01/22/2016   Labs from primary care dated 05/02/15: normal CMET Dated 05/25/15: A1c5.8% Dated 11/10/15: cholesterol 177, triglycerides 91, LDL 110, HDL 49.    Assessment / Plan: 1. Status post mechanical aortic valve replacement.  Exam is stable. INR has been therapeutic.  2. Status post CABG for ostial coronary disease. No angina. Recommend follow up stress Myoview since it has been almost 10 years since her last evaluation. Will refer back to cardiac Rehab maintenance.  3. Hyperlipidemia. Mild hypercholesterolemia. She is interested in trying low dose statin now. Will try Crestor 5 mg twice a week.  4. Right bundle branch block/LAFB. She is asymptomatic.  5. PAD. Asymptomatic. Recommend regular walking program. Foot hygiene discussed. Lipid lowering therapy  6. Palpitations. Improved.

## 2016-03-15 ENCOUNTER — Telehealth (HOSPITAL_COMMUNITY): Payer: Self-pay

## 2016-03-15 ENCOUNTER — Ambulatory Visit (INDEPENDENT_AMBULATORY_CARE_PROVIDER_SITE_OTHER): Payer: Medicare Other | Admitting: Cardiology

## 2016-03-15 ENCOUNTER — Encounter: Payer: Self-pay | Admitting: Cardiology

## 2016-03-15 ENCOUNTER — Ambulatory Visit (INDEPENDENT_AMBULATORY_CARE_PROVIDER_SITE_OTHER): Payer: Medicare Other | Admitting: Pharmacist

## 2016-03-15 VITALS — BP 140/78 | HR 74 | Ht 66.0 in | Wt 162.0 lb

## 2016-03-15 DIAGNOSIS — Z7901 Long term (current) use of anticoagulants: Secondary | ICD-10-CM

## 2016-03-15 DIAGNOSIS — Z951 Presence of aortocoronary bypass graft: Secondary | ICD-10-CM | POA: Diagnosis not present

## 2016-03-15 DIAGNOSIS — R002 Palpitations: Secondary | ICD-10-CM

## 2016-03-15 DIAGNOSIS — I739 Peripheral vascular disease, unspecified: Secondary | ICD-10-CM

## 2016-03-15 DIAGNOSIS — Z952 Presence of prosthetic heart valve: Secondary | ICD-10-CM | POA: Diagnosis not present

## 2016-03-15 DIAGNOSIS — I359 Nonrheumatic aortic valve disorder, unspecified: Secondary | ICD-10-CM | POA: Diagnosis not present

## 2016-03-15 DIAGNOSIS — E785 Hyperlipidemia, unspecified: Secondary | ICD-10-CM

## 2016-03-15 DIAGNOSIS — I10 Essential (primary) hypertension: Secondary | ICD-10-CM

## 2016-03-15 LAB — POCT INR: INR: 2.8

## 2016-03-15 MED ORDER — ROSUVASTATIN CALCIUM 5 MG PO TABS: 5.0000 mg | ORAL_TABLET | Freq: Every day | ORAL | 11 refills | Status: DC

## 2016-03-15 NOTE — Telephone Encounter (Signed)
Encounter complete. 

## 2016-03-15 NOTE — Patient Instructions (Signed)
Start Crestor 5 mg twice a week  Continue your other therapy  We will schedule you for a nuclear stress test  Resume cardiac Rehab

## 2016-03-20 ENCOUNTER — Ambulatory Visit (HOSPITAL_COMMUNITY)
Admission: RE | Admit: 2016-03-20 | Discharge: 2016-03-20 | Disposition: A | Discharge status: Home / Self Care | Payer: Medicare Other | Source: Ambulatory Visit | Attending: Cardiology | Admitting: Cardiology

## 2016-03-20 DIAGNOSIS — Z7901 Long term (current) use of anticoagulants: Secondary | ICD-10-CM

## 2016-03-20 DIAGNOSIS — I10 Essential (primary) hypertension: Secondary | ICD-10-CM

## 2016-03-20 DIAGNOSIS — E002 Congenital iodine-deficiency syndrome, mixed type: Secondary | ICD-10-CM

## 2016-03-20 DIAGNOSIS — I359 Nonrheumatic aortic valve disorder, unspecified: Secondary | ICD-10-CM | POA: Diagnosis present

## 2016-03-20 DIAGNOSIS — R002 Palpitations: Secondary | ICD-10-CM | POA: Diagnosis not present

## 2016-03-20 DIAGNOSIS — I739 Peripheral vascular disease, unspecified: Secondary | ICD-10-CM

## 2016-03-20 DIAGNOSIS — Z951 Presence of aortocoronary bypass graft: Secondary | ICD-10-CM | POA: Diagnosis not present

## 2016-03-20 DIAGNOSIS — E785 Hyperlipidemia, unspecified: Secondary | ICD-10-CM

## 2016-03-20 DIAGNOSIS — Z952 Presence of prosthetic heart valve: Secondary | ICD-10-CM | POA: Diagnosis not present

## 2016-03-20 LAB — MYOCARDIAL PERFUSION IMAGING
CHL RATE OF PERCEIVED EXERTION: 17
CSEPEW: 9.7 METS
Exercise duration (min): 7 min
Exercise duration (sec): 45 s
LVDIAVOL: 101 mL (ref 46–106)
LVSYSVOL: 37 mL
MPHR: 143 {beats}/min
NUC STRESS TID: 1.07
Peak HR: 150 {beats}/min
Percent HR: 104 %
Rest HR: 56 {beats}/min
SDS: 0
SRS: 4
SSS: 4

## 2016-03-20 MED ORDER — TECHNETIUM TC 99M TETROFOSMIN IV KIT
10.8000 | PACK | Freq: Once | INTRAVENOUS | Status: AC | PRN
Start: 2016-03-20 — End: 2016-03-20
  Administered 2016-03-20: 10.8 via INTRAVENOUS
  Filled 2016-03-20: qty 11

## 2016-03-20 MED ORDER — TECHNETIUM TC 99M TETROFOSMIN IV KIT
30.2000 | PACK | Freq: Once | INTRAVENOUS | Status: AC | PRN
  Administered 2016-03-20: 30.2 via INTRAVENOUS
  Filled 2016-03-20: qty 31

## 2016-03-27 ENCOUNTER — Encounter (HOSPITAL_COMMUNITY)
Admission: RE | Admit: 2016-03-27 | Discharge: 2016-03-27 | Disposition: A | Discharge status: Home / Self Care | Payer: Self-pay | Source: Ambulatory Visit | Attending: Cardiology | Admitting: Cardiology

## 2016-03-27 DIAGNOSIS — Z48812 Encounter for surgical aftercare following surgery on the circulatory system: Secondary | ICD-10-CM | POA: Insufficient documentation

## 2016-03-27 DIAGNOSIS — I38 Endocarditis, valve unspecified: Secondary | ICD-10-CM | POA: Insufficient documentation

## 2016-03-27 DIAGNOSIS — Z951 Presence of aortocoronary bypass graft: Secondary | ICD-10-CM | POA: Insufficient documentation

## 2016-03-28 ENCOUNTER — Encounter (HOSPITAL_COMMUNITY)
Admission: RE | Admit: 2016-03-28 | Discharge: 2016-03-28 | Disposition: A | Discharge status: Home / Self Care | Payer: Self-pay | Source: Ambulatory Visit | Attending: Cardiology | Admitting: Cardiology

## 2016-04-02 ENCOUNTER — Encounter (HOSPITAL_COMMUNITY)
Admission: RE | Admit: 2016-04-02 | Discharge: 2016-04-02 | Disposition: A | Discharge status: Home / Self Care | Payer: Self-pay | Source: Ambulatory Visit | Attending: Cardiology | Admitting: Cardiology

## 2016-04-03 ENCOUNTER — Encounter (HOSPITAL_COMMUNITY): Payer: Self-pay

## 2016-04-04 ENCOUNTER — Encounter (HOSPITAL_COMMUNITY): Payer: Self-pay

## 2016-04-09 ENCOUNTER — Encounter (HOSPITAL_COMMUNITY): Payer: Self-pay

## 2016-04-10 ENCOUNTER — Encounter (HOSPITAL_COMMUNITY): Payer: Self-pay

## 2016-04-11 ENCOUNTER — Encounter (HOSPITAL_COMMUNITY): Payer: Self-pay

## 2016-04-16 ENCOUNTER — Encounter (HOSPITAL_COMMUNITY): Payer: Self-pay

## 2016-04-17 ENCOUNTER — Encounter (HOSPITAL_COMMUNITY): Payer: Self-pay

## 2016-04-18 ENCOUNTER — Encounter (HOSPITAL_COMMUNITY): Payer: Self-pay

## 2016-04-18 DIAGNOSIS — Z48812 Encounter for surgical aftercare following surgery on the circulatory system: Secondary | ICD-10-CM | POA: Insufficient documentation

## 2016-04-18 DIAGNOSIS — Z951 Presence of aortocoronary bypass graft: Secondary | ICD-10-CM | POA: Insufficient documentation

## 2016-04-18 DIAGNOSIS — I38 Endocarditis, valve unspecified: Secondary | ICD-10-CM | POA: Insufficient documentation

## 2016-04-23 ENCOUNTER — Encounter (HOSPITAL_COMMUNITY)
Admission: RE | Admit: 2016-04-23 | Discharge: 2016-04-23 | Disposition: A | Discharge status: Home / Self Care | Payer: Self-pay | Source: Ambulatory Visit | Attending: Cardiology | Admitting: Cardiology

## 2016-04-23 ENCOUNTER — Telehealth: Payer: Self-pay | Admitting: Cardiology

## 2016-04-23 NOTE — Telephone Encounter (Signed)
New message   Wants to discuss handicap sticker.

## 2016-04-23 NOTE — Telephone Encounter (Signed)
Spoke with pt, she wants to know if dr Swazilandjordan would give her a handicap plaque. She would like to remind dr Swazilandjordan that she is 76 years old and has had 2 open heart surgeries. She also reports when she goes to cardiac rehab at [facility], she has to park a long way away and she wants the ability to park closer to the building. She esp has trouble with inclines and long distance. Will forward for dr jordan's review and advise.

## 2016-04-23 NOTE — Telephone Encounter (Signed)
Yes that is fine  Peter Jordan MD, FACC  

## 2016-04-24 ENCOUNTER — Encounter (HOSPITAL_COMMUNITY)
Admission: RE | Admit: 2016-04-24 | Discharge: 2016-04-24 | Disposition: A | Discharge status: Home / Self Care | Payer: Self-pay | Source: Ambulatory Visit | Attending: Cardiology | Admitting: Cardiology

## 2016-04-25 ENCOUNTER — Encounter (HOSPITAL_COMMUNITY)
Admission: RE | Admit: 2016-04-25 | Discharge: 2016-04-25 | Disposition: A | Discharge status: Home / Self Care | Payer: Self-pay | Source: Ambulatory Visit | Attending: Cardiology | Admitting: Cardiology

## 2016-04-25 NOTE — Telephone Encounter (Signed)
Returned call to patient.Advised Dr.Jordan signed handicap parking form.[facility].

## 2016-04-26 ENCOUNTER — Ambulatory Visit (INDEPENDENT_AMBULATORY_CARE_PROVIDER_SITE_OTHER): Payer: Medicare Other | Admitting: Pharmacist

## 2016-04-26 DIAGNOSIS — Z7901 Long term (current) use of anticoagulants: Secondary | ICD-10-CM

## 2016-04-26 DIAGNOSIS — I359 Nonrheumatic aortic valve disorder, unspecified: Secondary | ICD-10-CM | POA: Diagnosis not present

## 2016-04-26 LAB — POCT INR: INR: 2.7

## 2016-04-30 ENCOUNTER — Encounter (HOSPITAL_COMMUNITY)
Admission: RE | Admit: 2016-04-30 | Discharge: 2016-04-30 | Disposition: A | Discharge status: Home / Self Care | Payer: Self-pay | Source: Ambulatory Visit | Attending: Cardiology | Admitting: Cardiology

## 2016-05-01 ENCOUNTER — Encounter (HOSPITAL_COMMUNITY): Payer: Self-pay

## 2016-05-02 ENCOUNTER — Encounter (HOSPITAL_COMMUNITY)
Admission: RE | Admit: 2016-05-02 | Discharge: 2016-05-02 | Disposition: A | Discharge status: Home / Self Care | Payer: Self-pay | Source: Ambulatory Visit | Attending: Cardiology | Admitting: Cardiology

## 2016-05-07 ENCOUNTER — Encounter (HOSPITAL_COMMUNITY)
Admission: RE | Admit: 2016-05-07 | Discharge: 2016-05-07 | Disposition: A | Discharge status: Home / Self Care | Payer: Self-pay | Source: Ambulatory Visit | Attending: Cardiology | Admitting: Cardiology

## 2016-05-08 ENCOUNTER — Encounter (HOSPITAL_COMMUNITY)
Admission: RE | Admit: 2016-05-08 | Discharge: 2016-05-08 | Disposition: A | Discharge status: Home / Self Care | Payer: Self-pay | Source: Ambulatory Visit | Attending: Cardiology | Admitting: Cardiology

## 2016-05-09 ENCOUNTER — Encounter (HOSPITAL_COMMUNITY)
Admission: RE | Admit: 2016-05-09 | Discharge: 2016-05-09 | Disposition: A | Discharge status: Home / Self Care | Payer: Self-pay | Source: Ambulatory Visit | Attending: Cardiology | Admitting: Cardiology

## 2016-05-14 ENCOUNTER — Encounter (HOSPITAL_COMMUNITY)
Admission: RE | Admit: 2016-05-14 | Discharge: 2016-05-14 | Disposition: A | Discharge status: Home / Self Care | Payer: Self-pay | Source: Ambulatory Visit | Attending: Cardiology | Admitting: Cardiology

## 2016-05-15 ENCOUNTER — Encounter (HOSPITAL_COMMUNITY)
Admission: RE | Admit: 2016-05-15 | Discharge: 2016-05-15 | Disposition: A | Discharge status: Home / Self Care | Payer: Self-pay | Source: Ambulatory Visit | Attending: Cardiology | Admitting: Cardiology

## 2016-05-16 ENCOUNTER — Encounter (HOSPITAL_COMMUNITY)
Admission: RE | Admit: 2016-05-16 | Discharge: 2016-05-16 | Disposition: A | Discharge status: Home / Self Care | Payer: Self-pay | Source: Ambulatory Visit | Attending: Cardiology | Admitting: Cardiology

## 2016-05-16 DIAGNOSIS — I38 Endocarditis, valve unspecified: Secondary | ICD-10-CM | POA: Insufficient documentation

## 2016-05-16 DIAGNOSIS — Z951 Presence of aortocoronary bypass graft: Secondary | ICD-10-CM | POA: Insufficient documentation

## 2016-05-16 DIAGNOSIS — Z48812 Encounter for surgical aftercare following surgery on the circulatory system: Secondary | ICD-10-CM | POA: Insufficient documentation

## 2016-05-21 ENCOUNTER — Encounter (HOSPITAL_COMMUNITY)
Admission: RE | Admit: 2016-05-21 | Discharge: 2016-05-21 | Disposition: A | Discharge status: Home / Self Care | Payer: Self-pay | Source: Ambulatory Visit | Attending: Cardiology | Admitting: Cardiology

## 2016-05-22 ENCOUNTER — Encounter (HOSPITAL_COMMUNITY)
Admission: RE | Admit: 2016-05-22 | Discharge: 2016-05-22 | Disposition: A | Discharge status: Home / Self Care | Payer: Self-pay | Source: Ambulatory Visit | Attending: Cardiology | Admitting: Cardiology

## 2016-05-23 ENCOUNTER — Encounter (HOSPITAL_COMMUNITY)
Admission: RE | Admit: 2016-05-23 | Discharge: 2016-05-23 | Disposition: A | Discharge status: Home / Self Care | Payer: Self-pay | Source: Ambulatory Visit | Attending: Cardiology | Admitting: Cardiology

## 2016-05-28 ENCOUNTER — Encounter (HOSPITAL_COMMUNITY): Payer: Self-pay

## 2016-05-29 ENCOUNTER — Encounter (HOSPITAL_COMMUNITY)
Admission: RE | Admit: 2016-05-29 | Discharge: 2016-05-29 | Disposition: A | Discharge status: Home / Self Care | Payer: Self-pay | Source: Ambulatory Visit | Attending: Cardiology | Admitting: Cardiology

## 2016-05-30 ENCOUNTER — Encounter (HOSPITAL_COMMUNITY)
Admission: RE | Admit: 2016-05-30 | Discharge: 2016-05-30 | Disposition: A | Discharge status: Home / Self Care | Payer: Self-pay | Source: Ambulatory Visit | Attending: Cardiology | Admitting: Cardiology

## 2016-06-04 ENCOUNTER — Encounter (HOSPITAL_COMMUNITY)
Admission: RE | Admit: 2016-06-04 | Discharge: 2016-06-04 | Disposition: A | Discharge status: Home / Self Care | Payer: Self-pay | Source: Ambulatory Visit | Attending: Cardiology | Admitting: Cardiology

## 2016-06-05 ENCOUNTER — Encounter (HOSPITAL_COMMUNITY): Payer: Self-pay

## 2016-06-06 ENCOUNTER — Encounter (HOSPITAL_COMMUNITY)
Admission: RE | Admit: 2016-06-06 | Discharge: 2016-06-06 | Disposition: A | Discharge status: Home / Self Care | Payer: Self-pay | Source: Ambulatory Visit | Attending: Cardiology | Admitting: Cardiology

## 2016-06-07 ENCOUNTER — Ambulatory Visit (INDEPENDENT_AMBULATORY_CARE_PROVIDER_SITE_OTHER): Payer: Medicare Other | Admitting: Pharmacist

## 2016-06-07 DIAGNOSIS — Z7901 Long term (current) use of anticoagulants: Secondary | ICD-10-CM

## 2016-06-07 DIAGNOSIS — I359 Nonrheumatic aortic valve disorder, unspecified: Secondary | ICD-10-CM | POA: Diagnosis not present

## 2016-06-07 LAB — POCT INR: INR: 4.3

## 2016-06-11 ENCOUNTER — Encounter (HOSPITAL_COMMUNITY)
Admission: RE | Admit: 2016-06-11 | Discharge: 2016-06-11 | Disposition: A | Discharge status: Home / Self Care | Payer: Self-pay | Source: Ambulatory Visit | Attending: Cardiology | Admitting: Cardiology

## 2016-06-12 ENCOUNTER — Encounter (HOSPITAL_COMMUNITY)
Admission: RE | Admit: 2016-06-12 | Discharge: 2016-06-12 | Disposition: A | Discharge status: Home / Self Care | Payer: Self-pay | Source: Ambulatory Visit | Attending: Cardiology | Admitting: Cardiology

## 2016-06-13 ENCOUNTER — Encounter (HOSPITAL_COMMUNITY)
Admission: RE | Admit: 2016-06-13 | Discharge: 2016-06-13 | Disposition: A | Discharge status: Home / Self Care | Payer: Self-pay | Source: Ambulatory Visit | Attending: Cardiology | Admitting: Cardiology

## 2016-06-18 ENCOUNTER — Encounter (HOSPITAL_COMMUNITY)
Admission: RE | Admit: 2016-06-18 | Discharge: 2016-06-18 | Disposition: A | Discharge status: Home / Self Care | Payer: Self-pay | Source: Ambulatory Visit | Attending: Cardiology | Admitting: Cardiology

## 2016-06-18 DIAGNOSIS — I38 Endocarditis, valve unspecified: Secondary | ICD-10-CM | POA: Insufficient documentation

## 2016-06-18 DIAGNOSIS — Z48812 Encounter for surgical aftercare following surgery on the circulatory system: Secondary | ICD-10-CM | POA: Insufficient documentation

## 2016-06-18 DIAGNOSIS — Z951 Presence of aortocoronary bypass graft: Secondary | ICD-10-CM | POA: Insufficient documentation

## 2016-06-19 ENCOUNTER — Encounter (HOSPITAL_COMMUNITY): Payer: Self-pay

## 2016-06-20 ENCOUNTER — Encounter (HOSPITAL_COMMUNITY)
Admission: RE | Admit: 2016-06-20 | Discharge: 2016-06-20 | Disposition: A | Discharge status: Home / Self Care | Payer: Self-pay | Source: Ambulatory Visit | Attending: Cardiology | Admitting: Cardiology

## 2016-06-25 ENCOUNTER — Encounter (HOSPITAL_COMMUNITY)
Admission: RE | Admit: 2016-06-25 | Discharge: 2016-06-25 | Disposition: A | Discharge status: Home / Self Care | Payer: Self-pay | Source: Ambulatory Visit | Attending: Cardiology | Admitting: Cardiology

## 2016-06-26 ENCOUNTER — Encounter (HOSPITAL_COMMUNITY)
Admission: RE | Admit: 2016-06-26 | Discharge: 2016-06-26 | Disposition: A | Discharge status: Home / Self Care | Payer: Self-pay | Source: Ambulatory Visit | Attending: Cardiology | Admitting: Cardiology

## 2016-06-27 ENCOUNTER — Encounter (HOSPITAL_COMMUNITY): Payer: Self-pay

## 2016-06-28 ENCOUNTER — Ambulatory Visit (INDEPENDENT_AMBULATORY_CARE_PROVIDER_SITE_OTHER): Payer: Medicare Other | Admitting: Pharmacist Clinician (PhC)/ Clinical Pharmacy Specialist

## 2016-06-28 DIAGNOSIS — I359 Nonrheumatic aortic valve disorder, unspecified: Secondary | ICD-10-CM

## 2016-06-28 DIAGNOSIS — Z7901 Long term (current) use of anticoagulants: Secondary | ICD-10-CM

## 2016-06-28 LAB — POCT INR: INR: 2.4

## 2016-07-02 ENCOUNTER — Encounter (HOSPITAL_COMMUNITY)
Admission: RE | Admit: 2016-07-02 | Discharge: 2016-07-02 | Disposition: A | Discharge status: Home / Self Care | Payer: Self-pay | Source: Ambulatory Visit | Attending: Cardiology | Admitting: Cardiology

## 2016-07-02 ENCOUNTER — Telehealth: Payer: Self-pay

## 2016-07-02 NOTE — Telephone Encounter (Signed)
Received clearance from [facility].Dr.Jordan advised SBE prophylaxis is required.Clearance faxed back to fax # [phone].

## 2016-07-03 ENCOUNTER — Encounter (HOSPITAL_COMMUNITY)
Admission: RE | Admit: 2016-07-03 | Discharge: 2016-07-03 | Disposition: A | Discharge status: Home / Self Care | Payer: Self-pay | Source: Ambulatory Visit | Attending: Cardiology | Admitting: Cardiology

## 2016-07-04 ENCOUNTER — Encounter (HOSPITAL_COMMUNITY)
Admission: RE | Admit: 2016-07-04 | Discharge: 2016-07-04 | Disposition: A | Discharge status: Home / Self Care | Payer: Self-pay | Source: Ambulatory Visit | Attending: Cardiology | Admitting: Cardiology

## 2016-07-09 ENCOUNTER — Encounter (HOSPITAL_COMMUNITY): Payer: Self-pay

## 2016-07-10 ENCOUNTER — Encounter (HOSPITAL_COMMUNITY)
Admission: RE | Admit: 2016-07-10 | Discharge: 2016-07-10 | Disposition: A | Discharge status: Home / Self Care | Payer: Medicare Other | Source: Ambulatory Visit | Attending: Cardiology | Admitting: Cardiology

## 2016-07-11 ENCOUNTER — Encounter (HOSPITAL_COMMUNITY): Payer: Self-pay

## 2016-07-15 ENCOUNTER — Telehealth: Payer: Self-pay | Admitting: Cardiology

## 2016-07-15 NOTE — Telephone Encounter (Signed)
New message       Talk to the nurse.  Pt is considering having dental surgery and want to ask questions about the risk since pt is on blood thinner.  Please call

## 2016-07-15 NOTE — Telephone Encounter (Signed)
Returned call to patient Dr.Jordan's recommendations given.Stated she has not decided to have dental implants yet.Stated oral surgeon will be faxing a clearance letter for Dr.Jordan to sign.

## 2016-07-15 NOTE — Telephone Encounter (Signed)
I think she could safely have dental implants done. She would need SBE prophylaxis. I would hold Coumadin 3 days prior to procedure. I don't think she needs bridging anticoagulation  Earnestine Tuohey Swaziland MD, [facility]

## 2016-07-15 NOTE — Telephone Encounter (Signed)
Returned call to patient She is supposed to have 6 dental implants Patient is concerned what kind of risk this would be given her cardiac history Advised I would send a message to Dr. Swaziland for advice and she will be notified She voiced understanding

## 2016-07-16 ENCOUNTER — Encounter (HOSPITAL_COMMUNITY)
Admission: RE | Admit: 2016-07-16 | Discharge: 2016-07-16 | Disposition: A | Discharge status: Home / Self Care | Payer: Self-pay | Source: Ambulatory Visit | Attending: Cardiology | Admitting: Cardiology

## 2016-07-16 DIAGNOSIS — I38 Endocarditis, valve unspecified: Secondary | ICD-10-CM | POA: Insufficient documentation

## 2016-07-16 DIAGNOSIS — Z951 Presence of aortocoronary bypass graft: Secondary | ICD-10-CM | POA: Insufficient documentation

## 2016-07-16 DIAGNOSIS — Z48812 Encounter for surgical aftercare following surgery on the circulatory system: Secondary | ICD-10-CM | POA: Insufficient documentation

## 2016-07-17 ENCOUNTER — Encounter (HOSPITAL_COMMUNITY)
Admission: RE | Admit: 2016-07-17 | Discharge: 2016-07-17 | Disposition: A | Discharge status: Home / Self Care | Payer: Self-pay | Source: Ambulatory Visit | Attending: Cardiology | Admitting: Cardiology

## 2016-07-18 ENCOUNTER — Encounter (HOSPITAL_COMMUNITY)
Admission: RE | Admit: 2016-07-18 | Discharge: 2016-07-18 | Disposition: A | Discharge status: Home / Self Care | Payer: Self-pay | Source: Ambulatory Visit | Attending: Cardiology | Admitting: Cardiology

## 2016-07-19 ENCOUNTER — Ambulatory Visit (INDEPENDENT_AMBULATORY_CARE_PROVIDER_SITE_OTHER): Payer: Medicare Other | Admitting: Pharmacist Clinician (PhC)/ Clinical Pharmacy Specialist

## 2016-07-19 DIAGNOSIS — Z7901 Long term (current) use of anticoagulants: Secondary | ICD-10-CM

## 2016-07-19 DIAGNOSIS — I359 Nonrheumatic aortic valve disorder, unspecified: Secondary | ICD-10-CM | POA: Diagnosis not present

## 2016-07-19 LAB — POCT INR: INR: 2.2

## 2016-07-23 ENCOUNTER — Encounter (HOSPITAL_COMMUNITY)
Admission: RE | Admit: 2016-07-23 | Discharge: 2016-07-23 | Disposition: A | Discharge status: Home / Self Care | Payer: Self-pay | Source: Ambulatory Visit | Attending: Cardiology | Admitting: Cardiology

## 2016-07-24 ENCOUNTER — Encounter (HOSPITAL_COMMUNITY): Payer: Self-pay

## 2016-07-25 ENCOUNTER — Encounter (HOSPITAL_COMMUNITY)
Admission: RE | Admit: 2016-07-25 | Discharge: 2016-07-25 | Disposition: A | Discharge status: Home / Self Care | Payer: Self-pay | Source: Ambulatory Visit | Attending: Cardiology | Admitting: Cardiology

## 2016-07-30 ENCOUNTER — Encounter (HOSPITAL_COMMUNITY): Payer: Self-pay

## 2016-07-31 ENCOUNTER — Encounter (HOSPITAL_COMMUNITY): Payer: Self-pay

## 2016-08-01 ENCOUNTER — Encounter (HOSPITAL_COMMUNITY): Payer: Self-pay

## 2016-08-06 ENCOUNTER — Ambulatory Visit (INDEPENDENT_AMBULATORY_CARE_PROVIDER_SITE_OTHER): Payer: Medicare Other | Admitting: Pharmacist Clinician (PhC)/ Clinical Pharmacy Specialist

## 2016-08-06 ENCOUNTER — Encounter (HOSPITAL_COMMUNITY)
Admission: RE | Admit: 2016-08-06 | Discharge: 2016-08-06 | Disposition: A | Discharge status: Home / Self Care | Payer: Self-pay | Source: Ambulatory Visit | Attending: Cardiology | Admitting: Cardiology

## 2016-08-06 DIAGNOSIS — I359 Nonrheumatic aortic valve disorder, unspecified: Secondary | ICD-10-CM

## 2016-08-06 DIAGNOSIS — Z7901 Long term (current) use of anticoagulants: Secondary | ICD-10-CM

## 2016-08-06 LAB — POCT INR: INR: 5.4

## 2016-08-07 ENCOUNTER — Encounter (HOSPITAL_COMMUNITY): Payer: Self-pay

## 2016-08-08 ENCOUNTER — Encounter (HOSPITAL_COMMUNITY)
Admission: RE | Admit: 2016-08-08 | Discharge: 2016-08-08 | Disposition: A | Discharge status: Home / Self Care | Payer: Self-pay | Source: Ambulatory Visit | Attending: Cardiology | Admitting: Cardiology

## 2016-08-13 ENCOUNTER — Encounter (HOSPITAL_COMMUNITY): Payer: Self-pay

## 2016-08-14 ENCOUNTER — Encounter (HOSPITAL_COMMUNITY)
Admission: RE | Admit: 2016-08-14 | Discharge: 2016-08-14 | Disposition: A | Discharge status: Home / Self Care | Payer: Self-pay | Source: Ambulatory Visit | Attending: Cardiology | Admitting: Cardiology

## 2016-08-15 ENCOUNTER — Encounter (HOSPITAL_COMMUNITY)
Admission: RE | Admit: 2016-08-15 | Discharge: 2016-08-15 | Disposition: A | Discharge status: Home / Self Care | Payer: Self-pay | Source: Ambulatory Visit | Attending: Cardiology | Admitting: Cardiology

## 2016-08-20 ENCOUNTER — Encounter (HOSPITAL_COMMUNITY)
Admission: RE | Admit: 2016-08-20 | Discharge: 2016-08-20 | Disposition: A | Discharge status: Home / Self Care | Payer: Self-pay | Source: Ambulatory Visit | Attending: Cardiology | Admitting: Cardiology

## 2016-08-20 ENCOUNTER — Ambulatory Visit (INDEPENDENT_AMBULATORY_CARE_PROVIDER_SITE_OTHER): Payer: Medicare Other | Admitting: Pharmacist Clinician (PhC)/ Clinical Pharmacy Specialist

## 2016-08-20 DIAGNOSIS — Z7901 Long term (current) use of anticoagulants: Secondary | ICD-10-CM

## 2016-08-20 DIAGNOSIS — Z951 Presence of aortocoronary bypass graft: Secondary | ICD-10-CM | POA: Insufficient documentation

## 2016-08-20 DIAGNOSIS — I359 Nonrheumatic aortic valve disorder, unspecified: Secondary | ICD-10-CM | POA: Diagnosis not present

## 2016-08-20 DIAGNOSIS — I38 Endocarditis, valve unspecified: Secondary | ICD-10-CM | POA: Insufficient documentation

## 2016-08-20 DIAGNOSIS — Z48812 Encounter for surgical aftercare following surgery on the circulatory system: Secondary | ICD-10-CM | POA: Insufficient documentation

## 2016-08-20 LAB — POCT INR: INR: 3.2

## 2016-08-21 ENCOUNTER — Encounter (HOSPITAL_COMMUNITY)
Admission: RE | Admit: 2016-08-21 | Discharge: 2016-08-21 | Disposition: A | Discharge status: Home / Self Care | Payer: Self-pay | Source: Ambulatory Visit | Attending: Cardiology | Admitting: Cardiology

## 2016-08-22 ENCOUNTER — Encounter (HOSPITAL_COMMUNITY)
Admission: RE | Admit: 2016-08-22 | Discharge: 2016-08-22 | Disposition: A | Discharge status: Home / Self Care | Payer: Self-pay | Source: Ambulatory Visit | Attending: Cardiology | Admitting: Cardiology

## 2016-08-27 ENCOUNTER — Encounter (HOSPITAL_COMMUNITY): Payer: Self-pay

## 2016-08-28 ENCOUNTER — Encounter (HOSPITAL_COMMUNITY): Payer: Self-pay

## 2016-08-29 ENCOUNTER — Other Ambulatory Visit: Payer: Self-pay | Admitting: Pharmacist

## 2016-08-29 ENCOUNTER — Encounter (HOSPITAL_COMMUNITY): Payer: Self-pay

## 2016-08-29 MED ORDER — WARFARIN SODIUM 5 MG PO TABS: ORAL_TABLET | ORAL | 1 refills | Status: DC

## 2016-09-03 ENCOUNTER — Encounter (HOSPITAL_COMMUNITY)
Admission: RE | Admit: 2016-09-03 | Discharge: 2016-09-03 | Disposition: A | Discharge status: Home / Self Care | Payer: Self-pay | Source: Ambulatory Visit | Attending: Cardiology | Admitting: Cardiology

## 2016-09-04 ENCOUNTER — Encounter (HOSPITAL_COMMUNITY): Payer: Self-pay

## 2016-09-05 ENCOUNTER — Encounter (HOSPITAL_COMMUNITY)
Admission: RE | Admit: 2016-09-05 | Discharge: 2016-09-05 | Disposition: A | Discharge status: Home / Self Care | Payer: Self-pay | Source: Ambulatory Visit | Attending: Cardiology | Admitting: Cardiology

## 2016-09-06 ENCOUNTER — Other Ambulatory Visit: Payer: Self-pay | Admitting: Pharmacist Clinician (PhC)/ Clinical Pharmacy Specialist

## 2016-09-06 MED ORDER — WARFARIN SODIUM 5 MG PO TABS: ORAL_TABLET | ORAL | 1 refills | Status: DC

## 2016-09-10 ENCOUNTER — Encounter (HOSPITAL_COMMUNITY): Payer: Self-pay

## 2016-09-11 ENCOUNTER — Encounter (HOSPITAL_COMMUNITY)
Admission: RE | Admit: 2016-09-11 | Discharge: 2016-09-11 | Disposition: A | Discharge status: Home / Self Care | Payer: Self-pay | Source: Ambulatory Visit | Attending: Cardiology | Admitting: Cardiology

## 2016-09-12 ENCOUNTER — Encounter (HOSPITAL_COMMUNITY): Payer: Self-pay

## 2016-09-13 ENCOUNTER — Ambulatory Visit (INDEPENDENT_AMBULATORY_CARE_PROVIDER_SITE_OTHER): Payer: Medicare Other | Admitting: Pharmacist

## 2016-09-13 DIAGNOSIS — Z7901 Long term (current) use of anticoagulants: Secondary | ICD-10-CM | POA: Diagnosis not present

## 2016-09-13 DIAGNOSIS — I359 Nonrheumatic aortic valve disorder, unspecified: Secondary | ICD-10-CM | POA: Diagnosis not present

## 2016-09-13 LAB — POCT INR: INR: 3.1

## 2016-09-17 ENCOUNTER — Encounter (HOSPITAL_COMMUNITY)
Admission: RE | Admit: 2016-09-17 | Discharge: 2016-09-17 | Disposition: A | Discharge status: Home / Self Care | Payer: Self-pay | Source: Ambulatory Visit | Attending: Cardiology | Admitting: Cardiology

## 2016-09-17 DIAGNOSIS — I2510 Atherosclerotic heart disease of native coronary artery without angina pectoris: Secondary | ICD-10-CM | POA: Insufficient documentation

## 2016-09-19 ENCOUNTER — Encounter (HOSPITAL_COMMUNITY): Payer: Self-pay

## 2016-09-24 ENCOUNTER — Encounter (HOSPITAL_COMMUNITY)
Admission: RE | Admit: 2016-09-24 | Discharge: 2016-09-24 | Disposition: A | Discharge status: Home / Self Care | Payer: Self-pay | Source: Ambulatory Visit | Attending: Cardiology | Admitting: Cardiology

## 2016-09-25 ENCOUNTER — Encounter (HOSPITAL_COMMUNITY)
Admission: RE | Admit: 2016-09-25 | Discharge: 2016-09-25 | Disposition: A | Discharge status: Home / Self Care | Payer: Self-pay | Source: Ambulatory Visit | Attending: Cardiology | Admitting: Cardiology

## 2016-09-26 ENCOUNTER — Encounter (HOSPITAL_COMMUNITY)
Admission: RE | Admit: 2016-09-26 | Discharge: 2016-09-26 | Disposition: A | Discharge status: Home / Self Care | Payer: Self-pay | Source: Ambulatory Visit | Attending: Cardiology | Admitting: Cardiology

## 2016-09-28 NOTE — Progress Notes (Signed)
Tamara Patterson Date of Birth: 07-Nov-1940   History of Present Illness: Tamara Patterson is seen today for followup. 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 This included mechanical aortic valve replacement and grafting of the aortic root with a #23 mm St. Jude valve conduit. She also had coronary bypass surgery including an IMA graft to the LAD, saphenous vein graft to the right coronary, and saphenous vein graft to the first and second obtuse marginal vessels. 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 She was treated with pulse steroids for a period of time and was weaned off of this. Last Myoview study was in 2008. Echo in 2011 was satisfactory.  On follow up today she is doing very well. No chest pain or SOB.   She has been intolerant of statins and Zetia due to myalgias. Also intolerant of Welchol due to hypoglycemia. She is participating in Cardiac Rehab. Notes systolic BP elevated at rest. Goes down post exercise. She has lost 4 lbs. Her sister suffered a hemorrhagic CVA 2 weeks ago and is hospitalized.  Current Outpatient Prescriptions on File Prior to Visit  Medication Sig Dispense Refill  . citalopram (CELEXA) 20 MG tablet Take 20 mg by mouth daily.     Marland Kitchen ketotifen (ZADITOR) 0.025 % ophthalmic solution Place 1 drop into both eyes 2 (two) times daily as needed (for allergies).    Marland Kitchen MAGNESIUM MALATE PO Take 2 capsules by mouth daily.    . nitrofurantoin (MACRODANTIN) 100 MG capsule Take 100 mg by mouth at bedtime.    . Omega-3 Fatty Acids (FISH OIL) 1000 MG CAPS Take 2,000 mg by mouth daily.    . pantoprazole (PROTONIX) 20 MG tablet Take 20 mg by mouth daily as needed.     . rosuvastatin (CRESTOR) 5 MG tablet Take 1 tablet (5 mg total) by mouth daily. 30 tablet 11  . warfarin (COUMADIN) 5 MG tablet TAKE 1 to 1 and 1/2  TABLETS BY MOUTH ONCE DAILY AS  DIRECTED  BY  THE  COUMADIN  CLINIC 135 tablet 1   No  current facility-administered medications on file prior to visit.     Allergies  Allergen Reactions  . Codeine Nausea Only  . Lipitor [Atorvastatin Calcium] Other (See Comments)    Caused muscle aching  . Atenolol Other (See Comments)    cramps    Past Medical History:  Diagnosis Date  . Anticoagulant long-term use   . CAD (coronary artery disease)   . Dyslipidemia   . GERD (gastroesophageal reflux disease)   . Giant cell arteritis (HCC)    per aortotomy biopsy in 1991  . HTN (hypertension)   . Hx of CABG 1991   LIMA-LAD, SVG-RCA, SVG-1st and 2ndOM  . Panic anxiety syndrome   . S/P AVR (aortic valve replacement)    with root grafting; #28mm  St. Jude valve conduit; Last Echo 2007    Past Surgical History:  Procedure Laterality Date  . AORTIC VALVE REPLACEMENT     with root grafting  . CHOLECYSTECTOMY    . CORONARY ARTERY BYPASS GRAFT      History  Smoking Status  . Former Smoker  Smokeless Tobacco  . Never Used    History  Alcohol Use No    Family History  Problem Relation Age of Onset  . Liver disease Mother   . Pancreatic cancer Father     Review of Systems: As  noted in history of present illness. All other systems were reviewed and are negative.  Physical Exam: BP (!) 183/76   Pulse 63   Ht 5\' 6"  (1.676 m)   Wt 158 lb 9.6 oz (71.9 kg)   BMI 25.60 kg/m  The patient is alert and oriented x 3.   The skin is warm and dry. The HEENT exam is normal.  The carotids are 2+ without bruits.  There is no thyromegaly.  There is no JVD.  The lungs are clear.    The heart exam reveals a regular rate with a normal Mechanical aortic valve click.  There are no murmurs, gallops, or rubs.  The PMI is not displaced.   Abdominal exam reveals good bowel sounds.  There is no guarding or rebound.  There is no hepatosplenomegaly or tenderness.  There are no masses.  Exam of the legs reveal no clubbing, cyanosis, or edema.  The legs are without rashes.  The distal pulses are 2+  right DP and PT. Left DP is 2+ but PT is diminished. Femoral pulses are 2+ without bruits.  Cranial nerves II - XII are intact.  Motor and sensory functions are intact.  The gait is normal.  LABORATORY DATA:  Lab Results  Component Value Date   WBC 11.8 (H) 03/13/2015   HGB 12.6 03/13/2015   HCT 38.6 03/13/2015   PLT 194 03/13/2015   GLUCOSE 92 05/02/2015   CHOL 189 05/02/2015   TRIG 183 (H) 05/02/2015   HDL 41 (L) 05/02/2015   LDLDIRECT 126.4 10/07/2012   LDLCALC 111 05/02/2015   ALT 36 (H) 05/02/2015   AST 34 05/02/2015   NA 141 05/02/2015   K 4.3 05/02/2015   CL 106 05/02/2015   CREATININE 0.82 05/02/2015   BUN 8 05/02/2015   CO2 30 05/02/2015   INR 3.1 09/13/2016   Labs from primary care dated 05/02/15: normal CMET Dated 05/25/15: A1c5.8% Dated 11/10/15: cholesterol 177, triglycerides 91, LDL 110, HDL 49.  Ecg today shows NSR with PVCs. RBBB. LAD. I have personally reviewed and interpreted this study.   Myoview 03/20/16: Study Highlights     The left ventricular ejection fraction is normal (55-65%).  Nuclear stress EF: 64%.  There was no ST segment deviation noted during stress.  Blood pressure demonstrated a normal response to exercise.   Normal study, no evidence for ischemia or infarction     Assessment / Plan: 1. Status post mechanical aortic valve replacement.  Exam is stable. INR has been therapeutic. Check today.  2. Status post CABG for ostial coronary disease. No angina. Myoview 03/20/16 was normal. Continue Cardiac Rehab.   3. Hyperlipidemia. Mild hypercholesterolemia.  On very low dose Crestor. Will arrange follow up fasting lab work  4. Right bundle branch block/LAFB. She is asymptomatic.  5. PAD. Asymptomatic. Recommend regular walking program. Foot hygiene discussed. Lipid lowering therapy  6. HTN systolic. Recommend starting low dose chlorthalidone 12.5 mg daily. Check chemistry panel in 2-3 weeks.

## 2016-09-30 ENCOUNTER — Ambulatory Visit (INDEPENDENT_AMBULATORY_CARE_PROVIDER_SITE_OTHER): Payer: Medicare Other | Admitting: Pharmacist Clinician (PhC)/ Clinical Pharmacy Specialist

## 2016-09-30 ENCOUNTER — Ambulatory Visit (INDEPENDENT_AMBULATORY_CARE_PROVIDER_SITE_OTHER): Payer: Medicare Other | Admitting: Cardiology

## 2016-09-30 ENCOUNTER — Encounter: Payer: Self-pay | Admitting: Cardiology

## 2016-09-30 VITALS — BP 183/76 | HR 63 | Ht 66.0 in | Wt 158.6 lb

## 2016-09-30 DIAGNOSIS — I359 Nonrheumatic aortic valve disorder, unspecified: Secondary | ICD-10-CM

## 2016-09-30 DIAGNOSIS — Z7901 Long term (current) use of anticoagulants: Secondary | ICD-10-CM

## 2016-09-30 DIAGNOSIS — Z951 Presence of aortocoronary bypass graft: Secondary | ICD-10-CM

## 2016-09-30 DIAGNOSIS — Z952 Presence of prosthetic heart valve: Secondary | ICD-10-CM | POA: Diagnosis not present

## 2016-09-30 DIAGNOSIS — I10 Essential (primary) hypertension: Secondary | ICD-10-CM

## 2016-09-30 DIAGNOSIS — I739 Peripheral vascular disease, unspecified: Secondary | ICD-10-CM

## 2016-09-30 LAB — POCT INR: INR: 2.7

## 2016-09-30 MED ORDER — CHLORTHALIDONE 25 MG PO TABS: 12.5000 mg | ORAL_TABLET | Freq: Every day | ORAL | 3 refills | Status: DC

## 2016-09-30 NOTE — Patient Instructions (Signed)
Start chlorthalidone 12.5 mg daily  Continue your other therapy  We will check blood work in 2-3 weeks  I will see you in 6 months.

## 2016-10-01 ENCOUNTER — Encounter (HOSPITAL_COMMUNITY): Payer: Self-pay

## 2016-10-02 ENCOUNTER — Encounter (HOSPITAL_COMMUNITY)
Admission: RE | Admit: 2016-10-02 | Discharge: 2016-10-02 | Disposition: A | Discharge status: Home / Self Care | Payer: Self-pay | Source: Ambulatory Visit | Attending: Cardiology | Admitting: Cardiology

## 2016-10-03 ENCOUNTER — Encounter (HOSPITAL_COMMUNITY)
Admission: RE | Admit: 2016-10-03 | Discharge: 2016-10-03 | Disposition: A | Discharge status: Home / Self Care | Payer: Self-pay | Source: Ambulatory Visit | Attending: Cardiology | Admitting: Cardiology

## 2016-10-08 ENCOUNTER — Encounter (HOSPITAL_COMMUNITY): Payer: Self-pay

## 2016-10-09 ENCOUNTER — Encounter (HOSPITAL_COMMUNITY): Payer: Self-pay

## 2016-10-10 ENCOUNTER — Telehealth: Payer: Self-pay | Admitting: Cardiology

## 2016-10-10 ENCOUNTER — Emergency Department (HOSPITAL_COMMUNITY): Payer: Medicare Other

## 2016-10-10 ENCOUNTER — Encounter (HOSPITAL_COMMUNITY): Payer: Self-pay

## 2016-10-10 ENCOUNTER — Emergency Department (HOSPITAL_COMMUNITY)
Admission: EM | Admit: 2016-10-10 | Discharge: 2016-10-10 | Disposition: A | Discharge status: Home / Self Care | Payer: Medicare Other | Attending: Emergency Medicine | Admitting: Emergency Medicine

## 2016-10-10 DIAGNOSIS — I10 Essential (primary) hypertension: Secondary | ICD-10-CM | POA: Diagnosis not present

## 2016-10-10 DIAGNOSIS — Z951 Presence of aortocoronary bypass graft: Secondary | ICD-10-CM | POA: Insufficient documentation

## 2016-10-10 DIAGNOSIS — Z87891 Personal history of nicotine dependence: Secondary | ICD-10-CM | POA: Insufficient documentation

## 2016-10-10 DIAGNOSIS — R000 Tachycardia, unspecified: Secondary | ICD-10-CM | POA: Diagnosis present

## 2016-10-10 DIAGNOSIS — Z952 Presence of prosthetic heart valve: Secondary | ICD-10-CM | POA: Insufficient documentation

## 2016-10-10 DIAGNOSIS — Z7901 Long term (current) use of anticoagulants: Secondary | ICD-10-CM | POA: Diagnosis not present

## 2016-10-10 DIAGNOSIS — Z79899 Other long term (current) drug therapy: Secondary | ICD-10-CM | POA: Diagnosis not present

## 2016-10-10 DIAGNOSIS — R002 Palpitations: Secondary | ICD-10-CM | POA: Diagnosis not present

## 2016-10-10 LAB — CBC
HCT: 42.6 % (ref 36.0–46.0)
Hemoglobin: 13.8 g/dL (ref 12.0–15.0)
MCH: 26.4 pg (ref 26.0–34.0)
MCHC: 32.4 g/dL (ref 30.0–36.0)
MCV: 81.5 fL (ref 78.0–100.0)
PLATELETS: 210 10*3/uL (ref 150–400)
RBC: 5.23 MIL/uL — AB (ref 3.87–5.11)
RDW: 13.8 % (ref 11.5–15.5)
WBC: 5.5 10*3/uL (ref 4.0–10.5)

## 2016-10-10 LAB — BASIC METABOLIC PANEL
Anion gap: 9 (ref 5–15)
BUN: 19 mg/dL (ref 6–20)
CALCIUM: 9.6 mg/dL (ref 8.9–10.3)
CO2: 29 mmol/L (ref 22–32)
CREATININE: 0.96 mg/dL (ref 0.44–1.00)
Chloride: 99 mmol/L — ABNORMAL LOW (ref 101–111)
GFR calc non Af Amer: 56 mL/min — ABNORMAL LOW (ref >60)
Glucose, Bld: 111 mg/dL — ABNORMAL HIGH (ref 65–99)
Potassium: 4.1 mmol/L (ref 3.5–5.1)
SODIUM: 137 mmol/L (ref 135–145)

## 2016-10-10 LAB — I-STAT TROPONIN, ED: TROPONIN I, POC: 0 ng/mL (ref 0.00–0.08)

## 2016-10-10 MED ORDER — AMLODIPINE BESYLATE 2.5 MG PO TABS: 2.5000 mg | ORAL_TABLET | Freq: Every day | ORAL | 6 refills | Status: DC

## 2016-10-10 NOTE — Telephone Encounter (Signed)
Please call having a problem with her new medicine(Chlorthalidone).[facility].

## 2016-10-10 NOTE — Discharge Instructions (Signed)
Continue taking your home medications as prescribed. I recommend following up with your cardiologist within the next week regarding your heart palpitations. Please return to the Emergency Department if symptoms worsen or new onset of fever, chest pain, shortness of breath, dizziness, abdominal pain, vomiting, numbness, weakness, syncope.

## 2016-10-10 NOTE — ED Triage Notes (Signed)
Pt reports waking up this morning and feeling her heart beat quickly. Took her pulse and it was 93 without any exertion. Pt reports baseline HR is 60. Pt denies SOB or Chest pain, but reports weakness.

## 2016-10-10 NOTE — ED Provider Notes (Signed)
[facility]EM[facility]ENCY DEPT Provider Note   CSN: [id number] Arrival date & time: 10/10/16  [id number]     History   Chief Complaint Chief Complaint  Patient presents with  . Tachycardia    HPI Tamara Patterson is a 76 y.o. female.  HPI   Patient is a 76 year old female with history of CAD status post CABG, AVR on Coumadin, hypertension and hyperlipidemia who presents to the ED with complaint of heart palpitations, onset 7 AM. Patient states after waking up this morning while laying in bed she began feeling like her heart was fluttering. She states "I feel like my heart was working harder than usual". She endorses associated lightheadedness and mild generalized weakness. Pt reports she checked her HR during the episode and notes it was 93bpm, she reports her HR is typically in the 60s. She states her symptoms lasted for approximately an hour and resolved prior to arrival. Patient currently denies any symptoms upon arrival to the ED. Denies taking any medications for her symptoms prior to arrival. Patient notes she was recently started on a new blood pressure medication (Chlorthaldone) by her cardiologist on 09/30/16. She states since starting the medication she has been having intermittent body cramps. She states she has been drinking pickle juice at home which intermittently improves her cramps but then notes it causes an increase in her blood pressure. Patient denies any other recent changes in her medications. Denies history of similar palpitations or heart arrhythmia. Denies fever, chills, headache, chest pain, shortness of breath, cough, diaphoresis, abdominal pain, nausea, vomiting, urinary sxs, leg swelling, numbness, tingling, syncope.   PCP- Dr. Wynelle LinkSun Cardiologist- Dr. Thomasene LotJordon  Past Medical History:  Diagnosis Date  . Anticoagulant long-term use   . CAD (coronary artery disease)   . Dyslipidemia   . GERD (gastroesophageal reflux disease)   . Giant cell arteritis (HCC)    per aortotomy biopsy in  1991  . HTN (hypertension)   . Hx of CABG 1991   LIMA-LAD, SVG-RCA, SVG-1st and 2ndOM  . Panic anxiety syndrome   . S/P AVR (aortic valve replacement)    with root grafting; #3023mm  St. Jude valve conduit; Last Echo 2007    Patient Active Problem List   Diagnosis Date Noted  . Aortic valve disorder 06/26/2010  . Long term current use of anticoagulant 06/26/2010  . Palpitations 04/24/2010  . S/P St Jude AVR '91   . Hx of CABG-'91   . Dyslipidemia   . HTN (hypertension)   . Panic anxiety syndrome   . Giant cell arteritis [facility](HCC)     Past Surgical History:  Procedure Laterality Date  . AORTIC VALVE REPLACEMENT     with root grafting  . CHOLECYSTECTOMY    . CORONARY ARTERY BYPASS GRAFT      OB History    No data available       Home Medications    Prior to Admission medications   Medication Sig Start Date End Date Taking? Authorizing Provider  chlorthalidone (HYGROTON) 25 MG tablet Take 0.5 tablets (12.5 mg total) by mouth daily. 09/30/16 09/30/17 Yes [place], Peter M, MD  citalopram (CELEXA) 20 MG tablet Take 20 mg by mouth daily.    Yes [provider]  MAGNESIUM MALATE PO Take 2 capsules by mouth daily.   Yes [provider]  NON FORMULARY Take 1 capsule by mouth daily. "Anxiety Free" B complex supplement   Yes [provider]  Omega-3 Fatty Acids (FISH OIL) 1000 MG CAPS Take 2,000 mg  by mouth daily.   Yes [provider]  pantoprazole (PROTONIX) 20 MG tablet Take 20 mg by mouth daily as needed for heartburn.    Yes [provider]  POTASSIUM PO Take 2 capsules by mouth daily as needed (supplement).   Yes [provider]  rosuvastatin (CRESTOR) 5 MG tablet Take 1 tablet (5 mg total) by mouth daily. Patient taking differently: Take 5 mg by mouth 2 (two) times a week.  03/15/16  Yes [place], Peter M, MD  warfarin (COUMADIN) 5 MG tablet TAKE 1 to 1 and 1/2  TABLETS BY MOUTH ONCE DAILY AS  DIRECTED  BY  THE  COUMADIN   CLINIC Patient taking differently: Take 5-7.5 mg by mouth See admin instructions. 5mg  on Su, Tu, Thu - 7.5mg  on Sa, Mon, Wed, Fri 09/06/16  Yes [place], Peter M, MD    Family History Family History  Problem Relation Age of Onset  . Liver disease Mother   . Pancreatic cancer Father     Social History Social History  Substance Use Topics  . Smoking status: Former Games developermoker  . Smokeless tobacco: Never Used  . Alcohol use No     Allergies   Codeine; Lipitor [atorvastatin calcium]; and Atenolol   Review of Systems Review of Systems  Cardiovascular: Positive for palpitations.  Neurological: Positive for weakness (generalized) and light-headedness.  All other systems reviewed and are negative.    Physical Exam Updated Vital Signs BP 139/65   Pulse 64   Temp 97.7 F (36.5 C) (Oral)   Resp 13   Ht 5\' 6"  (1.676 m)   Wt 71.7 kg (158 lb)   SpO2 100%   BMI 25.50 kg/m   Physical Exam  Constitutional: She is oriented to person, place, and time. She appears well-developed and well-nourished. No distress.  HENT:  Head: Normocephalic and atraumatic.  Mouth/Throat: Oropharynx is clear and moist. No oropharyngeal exudate.  Eyes: Pupils are equal, round, and reactive to light. Conjunctivae and EOM are normal. Right eye exhibits no discharge. Left eye exhibits no discharge. No scleral icterus.  Neck: Normal range of motion. Neck supple.  Cardiovascular: Normal rate, regular rhythm, normal heart sounds and intact distal pulses.   HR 70  Pulmonary/Chest: Effort normal and breath sounds normal. No respiratory distress. She has no wheezes. She has no rales. She exhibits no tenderness.  Abdominal: Soft. Bowel sounds are normal. She exhibits no distension and no mass. There is no tenderness. There is no rebound and no guarding. No hernia.  Musculoskeletal: Normal range of motion. She exhibits no edema or tenderness.  Neurological: She is alert and oriented to person, place, and time.  Skin:  Skin is warm and dry. She is not diaphoretic.  Nursing note and vitals reviewed.    ED Treatments / Results  Labs (all labs ordered are listed, but only abnormal results are displayed) Labs Reviewed  BASIC METABOLIC PANEL - Abnormal; Notable for the following:       Result Value   Chloride 99 (*)    Glucose, Bld 111 (*)    GFR calc non Af Amer 56 (*)    All other components within normal limits  CBC - Abnormal; Notable for the following:    RBC 5.23 (*)    All other components within normal limits  I-STAT TROPONIN, ED    EKG  EKG Interpretation  Date/Time:  Thursday October 10 2016 08:15:54 EDT Ventricular Rate:  76 PR Interval:  192 QRS Duration: 160 QT Interval:  454 QTC Calculation: 510 R Axis:   -63 Text Interpretation:  Normal sinus rhythm Possible Left atrial enlargement Right bundle branch block Left anterior fascicular block. Bifascicular block. Left ventricular hypertrophy with repolarization abnormality Cannot rule out Septal infarct , age undetermined Abnormal ECG similar to previous EKG  Confirmed by Crista Curb [phone]) on 10/10/2016 10:10:17 AM       Radiology Dg Chest 2 View  Result Date: 10/10/2016 CLINICAL DATA:  Palpitations. EXAM: CHEST  2 VIEW COMPARISON:  Radiographs of April 13, 2013. FINDINGS: Stable cardiomediastinal silhouette. No pneumothorax or pleural effusion is noted. Bilateral upper lobe scarring is noted. Sternotomy wires and left hilar surgical clips are noted. No acute pulmonary disease is noted. Bony thorax is unremarkable. IMPRESSION: Stable biapical scarring. Postsurgical changes are noted. No acute abnormality seen. Electronically Signed   By: Lupita Raider, M.D.   On: 10/10/2016 09:36    Procedures Procedures (including critical care time)  Medications Ordered in ED Medications - No data to display   Initial Impression / Assessment and Plan / ED Course  I have reviewed the triage vital signs and the nursing notes.  Pertinent labs  & imaging results that were available during my care of the patient were reviewed by me and considered in my medical decision making (see chart for details).    Patient presents with episode of heart palpitations associated lightheadedness and generalized weakness that started this morning while she was laid in bed. Reports complete resolution of sxs prior to arrival to the ED. Denies fever, chest pain, shortness of breath. PMH of CAD status post CABG, AVR on Coumadin, hypertension and hyperlipidemia. VSS, HR 70. Exam unremarkable. EKG showed sinus rhythm with RBBB, LVH, left axis deviation; no significant changes from prior. Trop negative. Labs unremarkable. CXR negative. Discussed pt with Dr. Verdie Mosher who also evaluated pt in the ED. Pt has remained hemodynamically stable and asymptomatic while in the ED. I have a low suspicion for ACS, arrhythmia, PE, dissection, or other acute cardiac event at this time. Plan to d/c pt home with cardiology f/u regarding palpitations. Discussed return precautions.   Final Clinical Impressions(s) / ED Diagnoses   Final diagnoses:  Palpitations    New Prescriptions New Prescriptions   No medications on file     Barrett Henle, Cordelia Poche 10/10/16 1029    Lavera Guise, MD 10/10/16 1043

## 2016-10-10 NOTE — Telephone Encounter (Signed)
Returned call to patient she stated she woke up this morning and felt out of sorts.Stated her chest felt weak, heart rate 97.Stated she has been having cramping all over,frequent urination. She has not felt right since she started taking Chlorthalidone 12.5 mg daily.[facility].Stated she feels better now. Spoke to Dr.Jordan he advised to stop Chlorthalidone and start Amlodipine 2.5 mg daily.Advised ok to go out of town this weekend.Advised to call back if needed.

## 2016-10-15 ENCOUNTER — Encounter (HOSPITAL_COMMUNITY)
Admission: RE | Admit: 2016-10-15 | Discharge: 2016-10-15 | Disposition: A | Discharge status: Home / Self Care | Payer: Self-pay | Source: Ambulatory Visit | Attending: Cardiology | Admitting: Cardiology

## 2016-10-16 ENCOUNTER — Encounter (HOSPITAL_COMMUNITY)
Admission: RE | Admit: 2016-10-16 | Discharge: 2016-10-16 | Disposition: A | Discharge status: Home / Self Care | Payer: Medicare Other | Source: Ambulatory Visit | Attending: Cardiology | Admitting: Cardiology

## 2016-10-16 ENCOUNTER — Telehealth: Payer: Self-pay | Admitting: Cardiology

## 2016-10-16 DIAGNOSIS — I2510 Atherosclerotic heart disease of native coronary artery without angina pectoris: Secondary | ICD-10-CM | POA: Insufficient documentation

## 2016-10-16 NOTE — Telephone Encounter (Signed)
Returned call to patient.She stated B/P has been ranging 104/60 to 119/64.Stated she stopped taking Amlodipine 2.5 mg 2 days ago.Stated caused upset stomach.Advised to monitor B/P and if B/P becomes elevated she needs to call back.Stated she will start back Amlodipine if B/P elevated and will call if causes upset stomach.

## 2016-10-16 NOTE — Telephone Encounter (Signed)
Pt calling regarding being  on a new BP med-what to do when her BP is already low

## 2016-10-17 ENCOUNTER — Encounter (HOSPITAL_COMMUNITY)
Admission: RE | Admit: 2016-10-17 | Discharge: 2016-10-17 | Disposition: A | Discharge status: Home / Self Care | Payer: Medicare Other | Source: Ambulatory Visit | Attending: Cardiology | Admitting: Cardiology

## 2016-10-17 DIAGNOSIS — I2510 Atherosclerotic heart disease of native coronary artery without angina pectoris: Secondary | ICD-10-CM | POA: Diagnosis not present

## 2016-10-22 ENCOUNTER — Encounter (HOSPITAL_COMMUNITY): Payer: Medicare Other

## 2016-10-23 ENCOUNTER — Encounter (HOSPITAL_COMMUNITY): Payer: Medicare Other

## 2016-10-24 ENCOUNTER — Encounter (HOSPITAL_COMMUNITY): Payer: Medicare Other

## 2016-10-28 ENCOUNTER — Ambulatory Visit (INDEPENDENT_AMBULATORY_CARE_PROVIDER_SITE_OTHER): Payer: Medicare Other | Admitting: Pharmacist Clinician (PhC)/ Clinical Pharmacy Specialist

## 2016-10-28 DIAGNOSIS — I359 Nonrheumatic aortic valve disorder, unspecified: Secondary | ICD-10-CM | POA: Diagnosis not present

## 2016-10-28 DIAGNOSIS — Z7901 Long term (current) use of anticoagulants: Secondary | ICD-10-CM | POA: Diagnosis not present

## 2016-10-28 LAB — LIPID PANEL
CHOL/HDL RATIO: 2.8 ratio (ref 0.0–4.4)
Cholesterol, Total: 166 mg/dL (ref 100–199)
HDL: 60 mg/dL (ref >39)
LDL Calculated: 90 mg/dL (ref 0–99)
TRIGLYCERIDES: 80 mg/dL (ref 0–149)
VLDL Cholesterol Cal: 16 mg/dL (ref 5–40)

## 2016-10-28 LAB — COMPREHENSIVE METABOLIC PANEL
A/G RATIO: 1.9 (ref 1.2–2.2)
ALBUMIN: 4.3 g/dL (ref 3.5–4.8)
ALK PHOS: 89 IU/L (ref 39–117)
ALT: 31 IU/L (ref 0–32)
AST: 31 IU/L (ref 0–40)
BILIRUBIN TOTAL: 0.5 mg/dL (ref 0.0–1.2)
BUN/Creatinine Ratio: 16 (ref 12–28)
BUN: 14 mg/dL (ref 8–27)
CHLORIDE: 106 mmol/L (ref 96–106)
CO2: 26 mmol/L (ref 20–29)
CREATININE: 0.85 mg/dL (ref 0.57–1.00)
Calcium: 9.4 mg/dL (ref 8.7–10.3)
GFR calc Af Amer: 76 mL/min/{1.73_m2} (ref >59)
GFR calc non Af Amer: 66 mL/min/{1.73_m2} (ref >59)
GLOBULIN, TOTAL: 2.3 g/dL (ref 1.5–4.5)
Glucose: 93 mg/dL (ref 65–99)
POTASSIUM: 5 mmol/L (ref 3.5–5.2)
SODIUM: 144 mmol/L (ref 134–144)
Total Protein: 6.6 g/dL (ref 6.0–8.5)

## 2016-10-28 LAB — POCT INR: INR: 3.4

## 2016-10-29 ENCOUNTER — Encounter (HOSPITAL_COMMUNITY)
Admission: RE | Admit: 2016-10-29 | Discharge: 2016-10-29 | Disposition: A | Discharge status: Home / Self Care | Payer: Medicare Other | Source: Ambulatory Visit | Attending: Cardiology | Admitting: Cardiology

## 2016-10-30 ENCOUNTER — Encounter (HOSPITAL_COMMUNITY)
Admission: RE | Admit: 2016-10-30 | Discharge: 2016-10-30 | Disposition: A | Discharge status: Home / Self Care | Payer: Medicare Other | Source: Ambulatory Visit | Attending: Cardiology | Admitting: Cardiology

## 2016-10-31 ENCOUNTER — Encounter (HOSPITAL_COMMUNITY)
Admission: RE | Admit: 2016-10-31 | Discharge: 2016-10-31 | Disposition: A | Discharge status: Home / Self Care | Payer: Medicare Other | Source: Ambulatory Visit | Attending: Cardiology | Admitting: Cardiology

## 2016-11-05 ENCOUNTER — Encounter (HOSPITAL_COMMUNITY): Payer: Medicare Other

## 2016-11-06 ENCOUNTER — Encounter (HOSPITAL_COMMUNITY)
Admission: RE | Admit: 2016-11-06 | Discharge: 2016-11-06 | Disposition: A | Discharge status: Home / Self Care | Payer: Medicare Other | Source: Ambulatory Visit | Attending: Cardiology | Admitting: Cardiology

## 2016-11-07 ENCOUNTER — Encounter (HOSPITAL_COMMUNITY): Payer: Medicare Other

## 2016-11-12 ENCOUNTER — Encounter (HOSPITAL_COMMUNITY)
Admission: RE | Admit: 2016-11-12 | Discharge: 2016-11-12 | Disposition: A | Discharge status: Home / Self Care | Payer: Medicare Other | Source: Ambulatory Visit | Attending: Cardiology | Admitting: Cardiology

## 2016-11-13 ENCOUNTER — Encounter (HOSPITAL_COMMUNITY)
Admission: RE | Admit: 2016-11-13 | Discharge: 2016-11-13 | Disposition: A | Discharge status: Home / Self Care | Payer: Medicare Other | Source: Ambulatory Visit | Attending: Cardiology | Admitting: Cardiology

## 2016-11-14 ENCOUNTER — Encounter (HOSPITAL_COMMUNITY): Payer: Medicare Other

## 2016-11-19 ENCOUNTER — Encounter (HOSPITAL_COMMUNITY): Payer: Medicare Other

## 2016-11-19 DIAGNOSIS — I2510 Atherosclerotic heart disease of native coronary artery without angina pectoris: Secondary | ICD-10-CM | POA: Insufficient documentation

## 2016-11-20 ENCOUNTER — Encounter (HOSPITAL_COMMUNITY): Payer: Medicare Other

## 2016-11-21 ENCOUNTER — Encounter (HOSPITAL_COMMUNITY): Payer: Medicare Other

## 2016-11-26 ENCOUNTER — Encounter (HOSPITAL_COMMUNITY)
Admission: RE | Admit: 2016-11-26 | Discharge: 2016-11-26 | Disposition: A | Discharge status: Home / Self Care | Payer: Medicare Other | Source: Ambulatory Visit | Attending: Cardiology | Admitting: Cardiology

## 2016-11-27 ENCOUNTER — Encounter (HOSPITAL_COMMUNITY)
Admission: RE | Admit: 2016-11-27 | Discharge: 2016-11-27 | Disposition: A | Discharge status: Home / Self Care | Payer: Medicare Other | Source: Ambulatory Visit | Attending: Cardiology | Admitting: Cardiology

## 2016-11-28 ENCOUNTER — Encounter (HOSPITAL_COMMUNITY)
Admission: RE | Admit: 2016-11-28 | Discharge: 2016-11-28 | Disposition: A | Discharge status: Home / Self Care | Payer: Medicare Other | Source: Ambulatory Visit | Attending: Cardiology | Admitting: Cardiology

## 2016-12-03 ENCOUNTER — Encounter (HOSPITAL_COMMUNITY)
Admission: RE | Admit: 2016-12-03 | Discharge: 2016-12-03 | Disposition: A | Discharge status: Home / Self Care | Payer: Medicare Other | Source: Ambulatory Visit | Attending: Cardiology | Admitting: Cardiology

## 2016-12-04 ENCOUNTER — Encounter (HOSPITAL_COMMUNITY): Payer: Medicare Other

## 2016-12-05 ENCOUNTER — Encounter (HOSPITAL_COMMUNITY)
Admission: RE | Admit: 2016-12-05 | Discharge: 2016-12-05 | Disposition: A | Discharge status: Home / Self Care | Payer: Medicare Other | Source: Ambulatory Visit | Attending: Cardiology | Admitting: Cardiology

## 2016-12-09 ENCOUNTER — Ambulatory Visit (INDEPENDENT_AMBULATORY_CARE_PROVIDER_SITE_OTHER): Payer: Medicare Other | Admitting: Pharmacist Clinician (PhC)/ Clinical Pharmacy Specialist

## 2016-12-09 DIAGNOSIS — I359 Nonrheumatic aortic valve disorder, unspecified: Secondary | ICD-10-CM | POA: Diagnosis not present

## 2016-12-09 DIAGNOSIS — Z952 Presence of prosthetic heart valve: Secondary | ICD-10-CM | POA: Diagnosis not present

## 2016-12-09 DIAGNOSIS — Z7901 Long term (current) use of anticoagulants: Secondary | ICD-10-CM

## 2016-12-09 LAB — POCT INR: INR: 4.3

## 2016-12-10 ENCOUNTER — Encounter (HOSPITAL_COMMUNITY)
Admission: RE | Admit: 2016-12-10 | Discharge: 2016-12-10 | Disposition: A | Discharge status: Home / Self Care | Payer: Medicare Other | Source: Ambulatory Visit | Attending: Cardiology | Admitting: Cardiology

## 2016-12-11 ENCOUNTER — Encounter (HOSPITAL_COMMUNITY)
Admission: RE | Admit: 2016-12-11 | Discharge: 2016-12-11 | Disposition: A | Discharge status: Home / Self Care | Payer: Medicare Other | Source: Ambulatory Visit | Attending: Cardiology | Admitting: Cardiology

## 2016-12-12 ENCOUNTER — Encounter (HOSPITAL_COMMUNITY): Payer: Medicare Other

## 2016-12-17 ENCOUNTER — Encounter (HOSPITAL_COMMUNITY): Payer: Self-pay

## 2016-12-17 DIAGNOSIS — I2510 Atherosclerotic heart disease of native coronary artery without angina pectoris: Secondary | ICD-10-CM | POA: Insufficient documentation

## 2016-12-18 ENCOUNTER — Encounter (HOSPITAL_COMMUNITY): Payer: Self-pay

## 2016-12-19 ENCOUNTER — Encounter (HOSPITAL_COMMUNITY)
Admission: RE | Admit: 2016-12-19 | Discharge: 2016-12-19 | Disposition: A | Discharge status: Home / Self Care | Payer: Self-pay | Source: Ambulatory Visit | Attending: Cardiology | Admitting: Cardiology

## 2016-12-20 ENCOUNTER — Ambulatory Visit (INDEPENDENT_AMBULATORY_CARE_PROVIDER_SITE_OTHER): Payer: Medicare Other | Admitting: Pharmacist

## 2016-12-20 DIAGNOSIS — I359 Nonrheumatic aortic valve disorder, unspecified: Secondary | ICD-10-CM

## 2016-12-20 DIAGNOSIS — Z7901 Long term (current) use of anticoagulants: Secondary | ICD-10-CM

## 2016-12-20 LAB — POCT INR: INR: 3.5

## 2016-12-24 ENCOUNTER — Encounter (HOSPITAL_COMMUNITY): Payer: Self-pay

## 2016-12-25 ENCOUNTER — Encounter (HOSPITAL_COMMUNITY): Payer: Self-pay

## 2016-12-26 ENCOUNTER — Encounter (HOSPITAL_COMMUNITY): Payer: Self-pay

## 2016-12-31 ENCOUNTER — Encounter (HOSPITAL_COMMUNITY): Payer: Self-pay

## 2017-01-01 ENCOUNTER — Encounter (HOSPITAL_COMMUNITY)
Admission: RE | Admit: 2017-01-01 | Discharge: 2017-01-01 | Disposition: A | Discharge status: Home / Self Care | Payer: Self-pay | Source: Ambulatory Visit | Attending: Cardiology | Admitting: Cardiology

## 2017-01-02 ENCOUNTER — Encounter (HOSPITAL_COMMUNITY)
Admission: RE | Admit: 2017-01-02 | Discharge: 2017-01-02 | Disposition: A | Discharge status: Home / Self Care | Payer: Self-pay | Source: Ambulatory Visit | Attending: Cardiology | Admitting: Cardiology

## 2017-01-07 ENCOUNTER — Encounter (HOSPITAL_COMMUNITY)
Admission: RE | Admit: 2017-01-07 | Discharge: 2017-01-07 | Disposition: A | Discharge status: Home / Self Care | Payer: Self-pay | Source: Ambulatory Visit | Attending: Cardiology | Admitting: Cardiology

## 2017-01-08 ENCOUNTER — Encounter (HOSPITAL_COMMUNITY)
Admission: RE | Admit: 2017-01-08 | Discharge: 2017-01-08 | Disposition: A | Discharge status: Home / Self Care | Payer: Self-pay | Source: Ambulatory Visit | Attending: Cardiology | Admitting: Cardiology

## 2017-01-09 ENCOUNTER — Encounter (HOSPITAL_COMMUNITY)
Admission: RE | Admit: 2017-01-09 | Discharge: 2017-01-09 | Disposition: A | Discharge status: Home / Self Care | Payer: Self-pay | Source: Ambulatory Visit | Attending: Cardiology | Admitting: Cardiology

## 2017-01-14 ENCOUNTER — Encounter (HOSPITAL_COMMUNITY): Payer: Self-pay

## 2017-01-15 ENCOUNTER — Encounter (HOSPITAL_COMMUNITY)
Admission: RE | Admit: 2017-01-15 | Discharge: 2017-01-15 | Disposition: A | Discharge status: Home / Self Care | Payer: Medicare Other | Source: Ambulatory Visit | Attending: Cardiology | Admitting: Cardiology

## 2017-01-16 ENCOUNTER — Encounter (HOSPITAL_COMMUNITY): Payer: Self-pay

## 2017-01-16 DIAGNOSIS — I2510 Atherosclerotic heart disease of native coronary artery without angina pectoris: Secondary | ICD-10-CM | POA: Insufficient documentation

## 2017-01-21 ENCOUNTER — Encounter (HOSPITAL_COMMUNITY)
Admission: RE | Admit: 2017-01-21 | Discharge: 2017-01-21 | Disposition: A | Discharge status: Home / Self Care | Payer: Self-pay | Source: Ambulatory Visit | Attending: Cardiology | Admitting: Cardiology

## 2017-01-22 ENCOUNTER — Encounter (HOSPITAL_COMMUNITY): Payer: Self-pay

## 2017-01-23 ENCOUNTER — Encounter (HOSPITAL_COMMUNITY)
Admission: RE | Admit: 2017-01-23 | Discharge: 2017-01-23 | Disposition: A | Discharge status: Home / Self Care | Payer: Self-pay | Source: Ambulatory Visit | Attending: Cardiology | Admitting: Cardiology

## 2017-01-27 ENCOUNTER — Other Ambulatory Visit: Payer: Self-pay | Admitting: Cardiology

## 2017-01-28 ENCOUNTER — Encounter (HOSPITAL_COMMUNITY)
Admission: RE | Admit: 2017-01-28 | Discharge: 2017-01-28 | Disposition: A | Discharge status: Home / Self Care | Payer: Self-pay | Source: Ambulatory Visit | Attending: Cardiology | Admitting: Cardiology

## 2017-01-29 ENCOUNTER — Encounter (HOSPITAL_COMMUNITY): Payer: Self-pay

## 2017-01-30 ENCOUNTER — Encounter (HOSPITAL_COMMUNITY): Payer: Self-pay

## 2017-02-03 ENCOUNTER — Ambulatory Visit (INDEPENDENT_AMBULATORY_CARE_PROVIDER_SITE_OTHER): Payer: Medicare Other | Admitting: Pharmacist

## 2017-02-03 DIAGNOSIS — I359 Nonrheumatic aortic valve disorder, unspecified: Secondary | ICD-10-CM | POA: Diagnosis not present

## 2017-02-03 DIAGNOSIS — Z7901 Long term (current) use of anticoagulants: Secondary | ICD-10-CM | POA: Diagnosis not present

## 2017-02-03 LAB — POCT INR: INR: 2.7

## 2017-02-04 ENCOUNTER — Encounter (HOSPITAL_COMMUNITY): Payer: Self-pay

## 2017-02-05 ENCOUNTER — Encounter (HOSPITAL_COMMUNITY): Payer: Self-pay

## 2017-02-11 ENCOUNTER — Encounter (HOSPITAL_COMMUNITY): Payer: Self-pay

## 2017-02-12 ENCOUNTER — Encounter (HOSPITAL_COMMUNITY): Payer: Self-pay

## 2017-02-13 ENCOUNTER — Encounter (HOSPITAL_COMMUNITY): Payer: Self-pay

## 2017-02-17 ENCOUNTER — Telehealth (HOSPITAL_COMMUNITY): Payer: Self-pay | Admitting: *Deleted

## 2017-02-18 ENCOUNTER — Encounter (HOSPITAL_COMMUNITY): Payer: Self-pay

## 2017-02-18 DIAGNOSIS — I2510 Atherosclerotic heart disease of native coronary artery without angina pectoris: Secondary | ICD-10-CM | POA: Insufficient documentation

## 2017-02-19 ENCOUNTER — Encounter (HOSPITAL_COMMUNITY): Payer: Self-pay

## 2017-02-20 ENCOUNTER — Encounter (HOSPITAL_COMMUNITY): Payer: Self-pay

## 2017-02-25 ENCOUNTER — Encounter (HOSPITAL_COMMUNITY): Payer: Self-pay

## 2017-02-26 ENCOUNTER — Encounter (HOSPITAL_COMMUNITY): Payer: Self-pay

## 2017-02-27 ENCOUNTER — Encounter (HOSPITAL_COMMUNITY): Payer: Self-pay

## 2017-03-04 ENCOUNTER — Encounter (HOSPITAL_COMMUNITY): Payer: Self-pay

## 2017-03-05 ENCOUNTER — Encounter (HOSPITAL_COMMUNITY): Payer: Self-pay

## 2017-03-06 ENCOUNTER — Encounter (HOSPITAL_COMMUNITY)
Admission: RE | Admit: 2017-03-06 | Discharge: 2017-03-06 | Disposition: A | Discharge status: Home / Self Care | Payer: Self-pay | Source: Ambulatory Visit | Attending: Cardiology | Admitting: Cardiology

## 2017-03-12 ENCOUNTER — Encounter (HOSPITAL_COMMUNITY): Payer: Self-pay

## 2017-03-13 ENCOUNTER — Encounter (HOSPITAL_COMMUNITY): Payer: Self-pay

## 2017-03-19 ENCOUNTER — Encounter (HOSPITAL_COMMUNITY): Payer: Self-pay

## 2017-03-19 DIAGNOSIS — I2510 Atherosclerotic heart disease of native coronary artery without angina pectoris: Secondary | ICD-10-CM | POA: Insufficient documentation

## 2017-03-20 ENCOUNTER — Encounter (HOSPITAL_COMMUNITY): Payer: Self-pay

## 2017-03-24 ENCOUNTER — Ambulatory Visit (INDEPENDENT_AMBULATORY_CARE_PROVIDER_SITE_OTHER): Payer: Medicare Other | Admitting: Pharmacist

## 2017-03-24 DIAGNOSIS — I359 Nonrheumatic aortic valve disorder, unspecified: Secondary | ICD-10-CM | POA: Diagnosis not present

## 2017-03-24 DIAGNOSIS — Z7901 Long term (current) use of anticoagulants: Secondary | ICD-10-CM

## 2017-03-24 LAB — POCT INR: INR: 4.6

## 2017-03-25 ENCOUNTER — Encounter (HOSPITAL_COMMUNITY): Payer: Self-pay

## 2017-03-26 ENCOUNTER — Encounter (HOSPITAL_COMMUNITY): Payer: Self-pay

## 2017-03-27 ENCOUNTER — Encounter (HOSPITAL_COMMUNITY): Payer: Self-pay

## 2017-04-01 ENCOUNTER — Encounter (HOSPITAL_COMMUNITY)
Admission: RE | Admit: 2017-04-01 | Discharge: 2017-04-01 | Disposition: A | Discharge status: Home / Self Care | Payer: Self-pay | Source: Ambulatory Visit | Attending: Cardiology | Admitting: Cardiology

## 2017-04-02 ENCOUNTER — Encounter (HOSPITAL_COMMUNITY): Payer: Self-pay

## 2017-04-03 ENCOUNTER — Encounter (HOSPITAL_COMMUNITY)
Admission: RE | Admit: 2017-04-03 | Discharge: 2017-04-03 | Disposition: A | Discharge status: Home / Self Care | Payer: Medicare Other | Source: Ambulatory Visit | Attending: Cardiology | Admitting: Cardiology

## 2017-04-08 ENCOUNTER — Encounter (HOSPITAL_COMMUNITY): Payer: Self-pay

## 2017-04-09 ENCOUNTER — Encounter (HOSPITAL_COMMUNITY): Payer: Self-pay

## 2017-04-10 ENCOUNTER — Encounter (HOSPITAL_COMMUNITY): Payer: Self-pay

## 2017-04-14 ENCOUNTER — Ambulatory Visit (INDEPENDENT_AMBULATORY_CARE_PROVIDER_SITE_OTHER): Payer: Medicare Other | Admitting: Pharmacist

## 2017-04-14 DIAGNOSIS — I359 Nonrheumatic aortic valve disorder, unspecified: Secondary | ICD-10-CM

## 2017-04-14 DIAGNOSIS — Z7901 Long term (current) use of anticoagulants: Secondary | ICD-10-CM | POA: Diagnosis not present

## 2017-04-14 LAB — POCT INR: INR: 4.3

## 2017-04-14 NOTE — Patient Instructions (Signed)
HOLD warfarin dose today(January/28/19) ONLY, then decrease dose to with 1.5 tablet each Monday, Wednesday and Friday, 1 tablets all other days.  Repeat INR in 2 weeks

## 2017-04-15 ENCOUNTER — Encounter (HOSPITAL_COMMUNITY)
Admission: RE | Admit: 2017-04-15 | Discharge: 2017-04-15 | Disposition: A | Discharge status: Home / Self Care | Payer: Self-pay | Source: Ambulatory Visit | Attending: Cardiology | Admitting: Cardiology

## 2017-04-16 ENCOUNTER — Encounter (HOSPITAL_COMMUNITY): Payer: Self-pay

## 2017-04-17 ENCOUNTER — Encounter (HOSPITAL_COMMUNITY): Payer: Self-pay

## 2017-04-20 ENCOUNTER — Encounter (HOSPITAL_COMMUNITY): Payer: Self-pay | Admitting: Family Medicine

## 2017-04-20 ENCOUNTER — Ambulatory Visit (HOSPITAL_COMMUNITY)
Admission: EM | Admit: 2017-04-20 | Discharge: 2017-04-20 | Disposition: A | Discharge status: Home / Self Care | Payer: Medicare Other | Attending: Family Medicine | Admitting: Family Medicine

## 2017-04-20 ENCOUNTER — Other Ambulatory Visit: Payer: Self-pay

## 2017-04-20 DIAGNOSIS — J0100 Acute maxillary sinusitis, unspecified: Secondary | ICD-10-CM

## 2017-04-20 MED ORDER — AMOXICILLIN 875 MG PO TABS: 875.0000 mg | ORAL_TABLET | Freq: Two times a day (BID) | ORAL | 0 refills | Status: DC

## 2017-04-20 NOTE — Discharge Instructions (Signed)
--  Follow-up if symptoms persist

## 2017-04-20 NOTE — ED Triage Notes (Signed)
After frequent "colds" since Nov 2018, started with nasal congestion and pressure 4 days ago.  Unsure if fevers.  Also c/o bloody discharge when blowing her nose (pt is on anticoagulant)

## 2017-04-20 NOTE — ED Provider Notes (Signed)
[facility]   [id number] 04/20/17 Arrival Time: 1520   SUBJECTIVE:  Tamara Patterson is a 77 y.o. female who presents to the urgent care with complaint of bloody discharge when blowing her nose (pt is on anticoagulant).  Patient notes frequent upper respiratory infections since November 2018.  Past Medical History:  Diagnosis Date  . Anticoagulant long-term use   . CAD (coronary artery disease)   . Dyslipidemia   . GERD (gastroesophageal reflux disease)   . Giant cell arteritis (HCC)    per aortotomy biopsy in 1991  . HTN (hypertension)   . Hx of CABG 1991   LIMA-LAD, SVG-RCA, SVG-1st and 2ndOM  . Panic anxiety syndrome   . S/P AVR (aortic valve replacement)    with root grafting; #2423mm  St. Jude valve conduit; Last Echo 2007   Family History  Problem Relation Age of Onset  . Liver disease Mother   . Pancreatic cancer Father    Social History   Socioeconomic History  . Marital status: Married    Spouse name: Not on file  . Number of children: 0  . Years of education: Not on file  . Highest education level: Not on file  Social Needs  . Financial resource strain: Not on file  . Food insecurity - worry: Not on file  . Food insecurity - inability: Not on file  . Transportation needs - medical: Not on file  . Transportation needs - non-medical: Not on file  Occupational History  . Occupation: social work    Associate Professormployer: RETIRED    Comment: retired  Tobacco Use  . Smoking status: Former Games developermoker  . Smokeless tobacco: Never Used  Substance and Sexual Activity  . Alcohol use: No  . Drug use: No  . Sexual activity: Not on file  Other Topics Concern  . Not on file  Social History Narrative  . Not on file   Current Meds  Medication Sig  . amLODipine (NORVASC) 2.5 MG tablet Take 1 tablet (2.5 mg total) by mouth daily.  . citalopram (CELEXA) 20 MG tablet Take 20 mg by mouth daily.   . Omega-3 Fatty Acids (FISH OIL) 1000 MG CAPS Take 2,000 mg by mouth daily.  .  pantoprazole (PROTONIX) 20 MG tablet Take 20 mg by mouth daily as needed for heartburn.   . rosuvastatin (CRESTOR) 5 MG tablet Take 1 tablet (5 mg total) by mouth daily. (Patient taking differently: Take 5 mg by mouth 2 (two) times a week. )  . warfarin (COUMADIN) 5 MG tablet TAKE 1 to 1 and 1/2  TABLETS BY MOUTH ONCE DAILY AS  DIRECTED  BY  THE  COUMADIN  CLINIC (Patient taking differently: Take by mouth See admin instructions. Taking 1.5 tabs 3 days/wk; taking 1 tab 4 days/wk)   Allergies  Allergen Reactions  . Codeine Nausea Only  . Lipitor [Atorvastatin Calcium] Other (See Comments)    Caused muscle aching  . Atenolol Other (See Comments)    cramps      ROS: As per HPI, remainder of ROS negative.   OBJECTIVE:   Vitals:   04/20/17 1530  BP: (!) 148/61  Pulse: 69  Resp: 18  Temp: 97.9 F (36.6 C)  TempSrc: Oral  SpO2: 99%     General appearance: alert; no distress Eyes: PERRL; EOMI; conjunctiva normal HENT: normocephalic; atraumatic; TMs normal, canal normal, external ears normal without trauma; nasal mucosa normal; oral mucosa normal Neck: supple Lungs: clear to auscultation bilaterally Heart: regular rate  and rhythm Abdomen: soft, non-tender; bowel sounds normal; no masses or organomegaly; no guarding or rebound tenderness Back: no CVA tenderness Extremities: no cyanosis or edema; symmetrical with no gross deformities Skin: warm and dry Neurologic: normal gait; grossly normal Psychological: alert and cooperative; normal mood and affect      Labs:  Results for orders placed or performed in visit on 04/14/17  POCT INR  Result Value Ref Range   INR 4.3     Labs Reviewed - No data to display  No results found.     ASSESSMENT & PLAN:  1. Acute maxillary sinusitis, recurrence not specified     Meds ordered this encounter  Medications  . amoxicillin (AMOXIL) 875 MG tablet    Sig: Take 1 tablet (875 mg total) by mouth 2 (two) times daily.     Dispense:  20 tablet    Refill:  0    Reviewed expectations re: course of current medical issues. Questions answered. Outlined signs and symptoms indicating need for more acute intervention. Patient verbalized understanding. After Visit Summary given.    Procedures:      Elvina Sidle, MD 04/20/17 1547

## 2017-04-28 ENCOUNTER — Ambulatory Visit (INDEPENDENT_AMBULATORY_CARE_PROVIDER_SITE_OTHER): Payer: Medicare Other | Admitting: Pharmacist Clinician (PhC)/ Clinical Pharmacy Specialist

## 2017-04-28 DIAGNOSIS — I359 Nonrheumatic aortic valve disorder, unspecified: Secondary | ICD-10-CM | POA: Diagnosis not present

## 2017-04-28 DIAGNOSIS — Z7901 Long term (current) use of anticoagulants: Secondary | ICD-10-CM | POA: Diagnosis not present

## 2017-04-28 LAB — POCT INR: INR: 2.9

## 2017-04-30 ENCOUNTER — Encounter (HOSPITAL_COMMUNITY): Admission: RE | Admit: 2017-04-30 | Payer: Self-pay | Source: Ambulatory Visit

## 2017-04-30 DIAGNOSIS — I2510 Atherosclerotic heart disease of native coronary artery without angina pectoris: Secondary | ICD-10-CM | POA: Insufficient documentation

## 2017-05-13 ENCOUNTER — Encounter (HOSPITAL_COMMUNITY)
Admission: RE | Admit: 2017-05-13 | Discharge: 2017-05-13 | Disposition: A | Discharge status: Home / Self Care | Payer: Self-pay | Source: Ambulatory Visit | Attending: Cardiology | Admitting: Cardiology

## 2017-05-15 ENCOUNTER — Encounter (HOSPITAL_COMMUNITY)
Admission: RE | Admit: 2017-05-15 | Discharge: 2017-05-15 | Disposition: A | Discharge status: Home / Self Care | Payer: Self-pay | Source: Ambulatory Visit | Attending: Cardiology | Admitting: Cardiology

## 2017-05-20 ENCOUNTER — Encounter (HOSPITAL_COMMUNITY)
Admission: RE | Admit: 2017-05-20 | Discharge: 2017-05-20 | Disposition: A | Discharge status: Home / Self Care | Payer: Self-pay | Source: Ambulatory Visit | Attending: Cardiology | Admitting: Cardiology

## 2017-05-20 DIAGNOSIS — I2510 Atherosclerotic heart disease of native coronary artery without angina pectoris: Secondary | ICD-10-CM | POA: Insufficient documentation

## 2017-05-21 ENCOUNTER — Encounter (HOSPITAL_COMMUNITY)
Admission: RE | Admit: 2017-05-21 | Discharge: 2017-05-21 | Disposition: A | Discharge status: Home / Self Care | Payer: Self-pay | Source: Ambulatory Visit | Attending: Cardiology | Admitting: Cardiology

## 2017-05-22 ENCOUNTER — Telehealth: Payer: Self-pay | Admitting: Cardiology

## 2017-05-22 ENCOUNTER — Encounter (HOSPITAL_COMMUNITY): Payer: Self-pay

## 2017-05-22 NOTE — Telephone Encounter (Signed)
Returned call to patient.She stated she is concerned her resting heart rate has been ranging 53 to 60.Normally 60 to 67.Stated she feels good.Celexa was changed to Lexapro.Advised 53 to 60 is normal.I will let Dr.Jordan know.

## 2017-05-22 NOTE — Telephone Encounter (Signed)
New Message   STAT if HR is under 50 or over 120 (normal HR is 60-100 beats per minute)  1) What is your heart rate? 50's  2) Do you have a log of your heart rate readings (document readings)? No   3) Do you have any other symptoms? No  Patient states that her heart rate is in the 50's. Please call to discuss.

## 2017-05-23 NOTE — Telephone Encounter (Signed)
Spoke to patient Dr.Jordan advised pulse 53 to 60 normal.Patient was reassured.

## 2017-05-26 ENCOUNTER — Ambulatory Visit (INDEPENDENT_AMBULATORY_CARE_PROVIDER_SITE_OTHER): Payer: Medicare Other | Admitting: Pharmacist

## 2017-05-26 DIAGNOSIS — I359 Nonrheumatic aortic valve disorder, unspecified: Secondary | ICD-10-CM | POA: Diagnosis not present

## 2017-05-26 DIAGNOSIS — Z7901 Long term (current) use of anticoagulants: Secondary | ICD-10-CM | POA: Diagnosis not present

## 2017-05-26 LAB — POCT INR: INR: 2.4

## 2017-05-27 ENCOUNTER — Encounter (HOSPITAL_COMMUNITY): Payer: Self-pay

## 2017-05-28 ENCOUNTER — Encounter (HOSPITAL_COMMUNITY)
Admission: RE | Admit: 2017-05-28 | Discharge: 2017-05-28 | Disposition: A | Discharge status: Home / Self Care | Payer: Self-pay | Source: Ambulatory Visit | Attending: Cardiology | Admitting: Cardiology

## 2017-05-29 ENCOUNTER — Encounter (HOSPITAL_COMMUNITY)
Admission: RE | Admit: 2017-05-29 | Discharge: 2017-05-29 | Disposition: A | Discharge status: Home / Self Care | Payer: Self-pay | Source: Ambulatory Visit | Attending: Cardiology | Admitting: Cardiology

## 2017-06-03 ENCOUNTER — Encounter (HOSPITAL_COMMUNITY): Payer: Self-pay

## 2017-06-04 ENCOUNTER — Encounter (HOSPITAL_COMMUNITY): Payer: Self-pay

## 2017-06-05 ENCOUNTER — Encounter (HOSPITAL_COMMUNITY): Payer: Self-pay

## 2017-06-10 ENCOUNTER — Encounter (HOSPITAL_COMMUNITY): Payer: Self-pay

## 2017-06-11 ENCOUNTER — Encounter (HOSPITAL_COMMUNITY): Payer: Self-pay

## 2017-06-12 ENCOUNTER — Encounter (HOSPITAL_COMMUNITY)
Admission: RE | Admit: 2017-06-12 | Discharge: 2017-06-12 | Disposition: A | Discharge status: Home / Self Care | Payer: Self-pay | Source: Ambulatory Visit | Attending: Cardiology | Admitting: Cardiology

## 2017-06-17 ENCOUNTER — Encounter (HOSPITAL_COMMUNITY): Payer: Self-pay | Attending: Cardiology

## 2017-06-17 DIAGNOSIS — I2510 Atherosclerotic heart disease of native coronary artery without angina pectoris: Secondary | ICD-10-CM | POA: Insufficient documentation

## 2017-06-18 ENCOUNTER — Encounter (HOSPITAL_COMMUNITY): Payer: Self-pay

## 2017-06-19 ENCOUNTER — Encounter (HOSPITAL_COMMUNITY): Payer: Self-pay

## 2017-06-22 NOTE — Progress Notes (Signed)
[name] Date of Birth: 11/29/1940   History of Present Illness: Tamara Patterson is seen today for followup. 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 This included mechanical aortic valve replacement and grafting of the aortic root with a #23 mm St. Jude valve conduit. She also had coronary bypass surgery including an IMA graft to the LAD, saphenous vein graft to the right coronary, and saphenous vein graft to the first and second obtuse marginal vessels. 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 She was treated with pulse steroids for a period of time and was weaned off of this. Last Myoview study was in January 2018 and was normal. Echo in 2011 was satisfactory.  On follow up today she is doing very well. No chest pain or SOB.   She has been intolerant of statins and Zetia due to myalgias. Also intolerant of Welchol due to hypoglycemia. She is exercising regularly. BP has been under control. She notes on Apple watch her resting HR is 54-57. No dizziness or fatigue. Rarely has swelling and takes HCTZ prn.    Current Outpatient Medications on File Prior to Visit  Medication Sig Dispense Refill  . amLODipine (NORVASC) 2.5 MG tablet Take 1 tablet (2.5 mg total) by mouth daily. 30 tablet 6  . amoxicillin (AMOXIL) 875 MG tablet Take 1 tablet (875 mg total) by mouth 2 (two) times daily. 20 tablet 0  . citalopram (CELEXA) 20 MG tablet Take 20 mg by mouth daily.     Marland Kitchen MAGNESIUM MALATE PO Take 2 capsules by mouth daily.    . NON FORMULARY Take 1 capsule by mouth daily. "Anxiety Free" B complex supplement    . Omega-3 Fatty Acids (FISH OIL) 1000 MG CAPS Take 2,000 mg by mouth daily.    . pantoprazole (PROTONIX) 20 MG tablet Take 20 mg by mouth daily as needed for heartburn.     Marland Kitchen POTASSIUM PO Take 2 capsules by mouth daily as needed (supplement).    . rosuvastatin (CRESTOR) 5 MG tablet Take 1 tablet (5 mg total) by  mouth daily. (Patient taking differently: Take 5 mg by mouth 2 (two) times a week. ) 30 tablet 11  . warfarin (COUMADIN) 5 MG tablet TAKE 1 to 1 and 1/2  TABLETS BY MOUTH ONCE DAILY AS  DIRECTED  BY  THE  COUMADIN  CLINIC (Patient taking differently: Take by mouth See admin instructions. Taking 1.5 tabs 3 days/wk; taking 1 tab 4 days/wk) 135 tablet 1  . warfarin (COUMADIN) 5 MG tablet TAKE ONE & ONE-HALF TABLETS BY MOUTH ONCE DAILY AS DIRECTED BY THE COUMADIN CLINIC 90 tablet 1   No current facility-administered medications on file prior to visit.     Allergies  Allergen Reactions  . Codeine Nausea Only  . Lipitor [Atorvastatin Calcium] Other (See Comments)    Caused muscle aching  . Atenolol Other (See Comments)    cramps    Past Medical History:  Diagnosis Date  . Anticoagulant long-term use   . CAD (coronary artery disease)   . Dyslipidemia   . GERD (gastroesophageal reflux disease)   . Giant cell arteritis (HCC)    per aortotomy biopsy in 1991  . HTN (hypertension)   . Hx of CABG 1991   LIMA-LAD, SVG-RCA, SVG-1st and 2ndOM  . Panic anxiety syndrome   . S/P AVR (aortic valve replacement)    with root grafting; #68mm  St. Jude  valve conduit; Last Echo 2007    Past Surgical History:  Procedure Laterality Date  . AORTIC VALVE REPLACEMENT     with root grafting  . CARDIAC SURGERY    . CHOLECYSTECTOMY    . CORONARY ARTERY BYPASS GRAFT      Social History   Tobacco Use  Smoking Status Former Smoker  Smokeless Tobacco Never Used    Social History   Substance and Sexual Activity  Alcohol Use No    Family History  Problem Relation Age of Onset  . Liver disease Mother   . Pancreatic cancer Father     Review of Systems: As noted in history of present illness. All other systems were reviewed and are negative.  Physical Exam: There were no vitals taken for this visit. GENERAL:  Well appearing HEENT:  PERRL, EOMI, sclera are clear. Oropharynx is clear. NECK:   No jugular venous distention, carotid upstroke brisk and symmetric, no bruits, no thyromegaly or adenopathy LUNGS:  Clear to auscultation bilaterally CHEST:  Unremarkable HEART:  RRR,  PMI not displaced or sustained,good mechanical AV click, no S3, no S4: no clicks, no rubs, no murmurs ABD:  Soft, nontender. BS +, no masses or bruits. No hepatomegaly, no splenomegaly EXT:  2 + pulses throughout, no edema, no cyanosis no clubbing SKIN:  Warm and dry.  No rashes NEURO:  Alert and oriented x 3. Cranial nerves II through XII intact. PSYCH:  Cognitively intact    LABORATORY DATA:  Lab Results  Component Value Date   WBC 5.5 10/10/2016   HGB 13.8 10/10/2016   HCT 42.6 10/10/2016   PLT 210 10/10/2016   GLUCOSE 93 10/28/2016   CHOL 166 10/28/2016   TRIG 80 10/28/2016   HDL 60 10/28/2016   LDLDIRECT 126.4 10/07/2012   LDLCALC 90 10/28/2016   ALT 31 10/28/2016   AST 31 10/28/2016   NA 144 10/28/2016   K 5.0 10/28/2016   CL 106 10/28/2016   CREATININE 0.85 10/28/2016   BUN 14 10/28/2016   CO2 26 10/28/2016   INR 2.4 05/26/2017   Labs from primary care dated 05/02/15: normal CMET Dated 05/25/15: A1c5.8% Dated 11/10/15: cholesterol 177, triglycerides 91, LDL 110, HDL 49.   Myoview 03/20/16: Study Highlights     The left ventricular ejection fraction is normal (55-65%).  Nuclear stress EF: 64%.  There was no ST segment deviation noted during stress.  Blood pressure demonstrated a normal response to exercise.   Normal study, no evidence for ischemia or infarction     Assessment / Plan: 1. Status post mechanical aortic valve replacement.  Exam is stable. INR has been therapeutic. Routine SBE prophylaxis.   2. Status post CABG for ostial coronary disease. No angina. Myoview 03/20/16 was normal. Continue current medical therapy.   3. Hyperlipidemia. LDL 90.  On very low dose Crestor. Intolerant of multiple meds.   4. Right bundle branch block/LAFB. She is asymptomatic.  5.  PAD. Asymptomatic. Recommend regular walking program.   6. HTN systolic. Well controlled today.  I will follow up in 6 months with lab work

## 2017-06-23 ENCOUNTER — Ambulatory Visit (INDEPENDENT_AMBULATORY_CARE_PROVIDER_SITE_OTHER): Payer: Medicare Other | Admitting: Pharmacist Clinician (PhC)/ Clinical Pharmacy Specialist

## 2017-06-23 ENCOUNTER — Ambulatory Visit (INDEPENDENT_AMBULATORY_CARE_PROVIDER_SITE_OTHER): Payer: Medicare Other | Admitting: Cardiology

## 2017-06-23 ENCOUNTER — Encounter: Payer: Self-pay | Admitting: Cardiology

## 2017-06-23 VITALS — BP 138/68 | HR 60 | Ht 65.0 in | Wt 162.0 lb

## 2017-06-23 DIAGNOSIS — E785 Hyperlipidemia, unspecified: Secondary | ICD-10-CM | POA: Diagnosis not present

## 2017-06-23 DIAGNOSIS — Z7901 Long term (current) use of anticoagulants: Secondary | ICD-10-CM

## 2017-06-23 DIAGNOSIS — Z952 Presence of prosthetic heart valve: Secondary | ICD-10-CM

## 2017-06-23 DIAGNOSIS — Z951 Presence of aortocoronary bypass graft: Secondary | ICD-10-CM

## 2017-06-23 DIAGNOSIS — I359 Nonrheumatic aortic valve disorder, unspecified: Secondary | ICD-10-CM

## 2017-06-23 DIAGNOSIS — I10 Essential (primary) hypertension: Secondary | ICD-10-CM | POA: Diagnosis not present

## 2017-06-23 LAB — POCT INR: INR: 3.2

## 2017-06-23 NOTE — Patient Instructions (Addendum)
Continue your current therapy  I will see you in 6 months with  Lab work.

## 2017-06-23 NOTE — Addendum Note (Signed)
Addended by: Neoma LamingPUGH, CHERYL J on: 06/23/2017 09:19 AM   Modules accepted: Orders

## 2017-06-24 ENCOUNTER — Encounter (HOSPITAL_COMMUNITY): Payer: Self-pay

## 2017-06-25 ENCOUNTER — Encounter (HOSPITAL_COMMUNITY): Payer: Self-pay

## 2017-06-26 ENCOUNTER — Encounter (HOSPITAL_COMMUNITY): Payer: Self-pay

## 2017-07-01 ENCOUNTER — Encounter (HOSPITAL_COMMUNITY): Payer: Self-pay

## 2017-07-02 ENCOUNTER — Encounter (HOSPITAL_COMMUNITY): Payer: Self-pay

## 2017-07-03 ENCOUNTER — Encounter (HOSPITAL_COMMUNITY): Payer: Self-pay

## 2017-07-08 ENCOUNTER — Encounter (HOSPITAL_COMMUNITY): Payer: Self-pay

## 2017-07-09 ENCOUNTER — Encounter (HOSPITAL_COMMUNITY): Payer: Self-pay

## 2017-07-10 ENCOUNTER — Encounter (HOSPITAL_COMMUNITY): Payer: Self-pay

## 2017-07-15 ENCOUNTER — Encounter (HOSPITAL_COMMUNITY): Payer: Self-pay

## 2017-07-16 ENCOUNTER — Encounter (HOSPITAL_COMMUNITY): Payer: Self-pay | Attending: Cardiology

## 2017-07-16 DIAGNOSIS — I2510 Atherosclerotic heart disease of native coronary artery without angina pectoris: Secondary | ICD-10-CM | POA: Insufficient documentation

## 2017-07-17 ENCOUNTER — Encounter (HOSPITAL_COMMUNITY): Payer: Self-pay

## 2017-07-21 ENCOUNTER — Ambulatory Visit (INDEPENDENT_AMBULATORY_CARE_PROVIDER_SITE_OTHER): Payer: Medicare Other | Admitting: Pharmacist Clinician (PhC)/ Clinical Pharmacy Specialist

## 2017-07-21 DIAGNOSIS — Z7901 Long term (current) use of anticoagulants: Secondary | ICD-10-CM | POA: Diagnosis not present

## 2017-07-21 DIAGNOSIS — Z952 Presence of prosthetic heart valve: Secondary | ICD-10-CM

## 2017-07-21 DIAGNOSIS — I359 Nonrheumatic aortic valve disorder, unspecified: Secondary | ICD-10-CM

## 2017-07-21 LAB — POCT INR: INR: 3.1

## 2017-07-21 NOTE — Patient Instructions (Signed)
Description   Continue with 1.5 tablet each Monday, Wednesday and Friday, 1 tablets all other days.  Repeat INR in 4 weeks

## 2017-07-22 ENCOUNTER — Encounter (HOSPITAL_COMMUNITY): Payer: Self-pay

## 2017-07-23 ENCOUNTER — Encounter (HOSPITAL_COMMUNITY): Payer: Self-pay

## 2017-07-24 ENCOUNTER — Encounter (HOSPITAL_COMMUNITY): Payer: Self-pay

## 2017-07-29 ENCOUNTER — Encounter (HOSPITAL_COMMUNITY): Payer: Self-pay

## 2017-07-30 ENCOUNTER — Encounter (HOSPITAL_COMMUNITY): Payer: Self-pay

## 2017-07-31 ENCOUNTER — Encounter (HOSPITAL_COMMUNITY): Payer: Self-pay

## 2017-08-05 ENCOUNTER — Encounter (HOSPITAL_COMMUNITY): Payer: Self-pay

## 2017-08-06 ENCOUNTER — Encounter (HOSPITAL_COMMUNITY): Payer: Self-pay

## 2017-08-06 ENCOUNTER — Telehealth: Payer: Self-pay

## 2017-08-06 NOTE — Telephone Encounter (Signed)
Received clearance form from [facility].Form completed advising patient is required to have antibiotic prophylaxis before dental procedures.Ok to proceed with treatment.Form signed by Dr.Jordan and faxed back to fax # [phone].

## 2017-08-07 ENCOUNTER — Encounter (HOSPITAL_COMMUNITY): Payer: Self-pay

## 2017-08-12 ENCOUNTER — Encounter (HOSPITAL_COMMUNITY): Payer: Self-pay

## 2017-08-13 ENCOUNTER — Encounter (HOSPITAL_COMMUNITY): Payer: Self-pay

## 2017-08-14 ENCOUNTER — Encounter (HOSPITAL_COMMUNITY): Payer: Self-pay

## 2017-08-18 ENCOUNTER — Ambulatory Visit (INDEPENDENT_AMBULATORY_CARE_PROVIDER_SITE_OTHER): Payer: Medicare Other | Admitting: Pharmacist Clinician (PhC)/ Clinical Pharmacy Specialist

## 2017-08-18 DIAGNOSIS — I359 Nonrheumatic aortic valve disorder, unspecified: Secondary | ICD-10-CM

## 2017-08-18 DIAGNOSIS — Z7901 Long term (current) use of anticoagulants: Secondary | ICD-10-CM

## 2017-08-18 DIAGNOSIS — Z952 Presence of prosthetic heart valve: Secondary | ICD-10-CM

## 2017-08-18 LAB — POCT INR: INR: 3.6 — AB (ref 2.0–3.0)

## 2017-08-18 NOTE — Patient Instructions (Signed)
Description   Continue with 1.5 tablet each Monday, Wednesday and Friday, 1 tablets all other days.  Repeat INR in 4 weeks

## 2017-08-19 ENCOUNTER — Encounter (HOSPITAL_COMMUNITY): Payer: Self-pay

## 2017-08-20 ENCOUNTER — Encounter (HOSPITAL_COMMUNITY): Payer: Self-pay

## 2017-08-21 ENCOUNTER — Encounter (HOSPITAL_COMMUNITY): Payer: Self-pay

## 2017-08-23 ENCOUNTER — Other Ambulatory Visit: Payer: Self-pay | Admitting: Cardiology

## 2017-08-26 ENCOUNTER — Encounter (HOSPITAL_COMMUNITY): Payer: Self-pay

## 2017-08-27 ENCOUNTER — Encounter (HOSPITAL_COMMUNITY): Payer: Self-pay

## 2017-08-28 ENCOUNTER — Encounter (HOSPITAL_COMMUNITY): Payer: Self-pay

## 2017-09-02 ENCOUNTER — Encounter (HOSPITAL_COMMUNITY): Payer: Self-pay

## 2017-09-03 ENCOUNTER — Encounter (HOSPITAL_COMMUNITY): Payer: Self-pay

## 2017-09-03 ENCOUNTER — Telehealth: Payer: Self-pay

## 2017-09-03 ENCOUNTER — Other Ambulatory Visit: Payer: Self-pay | Admitting: Cardiology

## 2017-09-03 NOTE — Telephone Encounter (Signed)
Opened in error

## 2017-09-04 ENCOUNTER — Encounter (HOSPITAL_COMMUNITY): Payer: Self-pay

## 2017-09-09 ENCOUNTER — Encounter (HOSPITAL_COMMUNITY): Payer: Self-pay

## 2017-09-10 ENCOUNTER — Encounter (HOSPITAL_COMMUNITY): Payer: Self-pay

## 2017-09-11 ENCOUNTER — Encounter (HOSPITAL_COMMUNITY): Payer: Self-pay

## 2017-09-15 ENCOUNTER — Ambulatory Visit (INDEPENDENT_AMBULATORY_CARE_PROVIDER_SITE_OTHER): Payer: Medicare Other | Admitting: Pharmacist

## 2017-09-15 DIAGNOSIS — I359 Nonrheumatic aortic valve disorder, unspecified: Secondary | ICD-10-CM

## 2017-09-15 DIAGNOSIS — Z7901 Long term (current) use of anticoagulants: Secondary | ICD-10-CM | POA: Diagnosis not present

## 2017-09-15 LAB — POCT INR: INR: 3 (ref 2.0–3.0)

## 2017-09-15 NOTE — Patient Instructions (Signed)
Continue with 1.5 tablet each Monday, Wednesday and Friday, 1 tablets all other days.  Repeat INR in 4 weeks *Call clinic at [phone] to report changes and/or procedures*

## 2017-09-16 ENCOUNTER — Encounter (HOSPITAL_COMMUNITY): Payer: Self-pay

## 2017-09-17 ENCOUNTER — Encounter (HOSPITAL_COMMUNITY): Payer: Self-pay

## 2017-09-23 ENCOUNTER — Encounter (HOSPITAL_COMMUNITY): Payer: Self-pay

## 2017-09-24 ENCOUNTER — Encounter (HOSPITAL_COMMUNITY): Payer: Self-pay

## 2017-09-25 ENCOUNTER — Encounter (HOSPITAL_COMMUNITY): Payer: Self-pay

## 2017-09-30 ENCOUNTER — Encounter (HOSPITAL_COMMUNITY): Payer: Self-pay

## 2017-10-01 ENCOUNTER — Encounter (HOSPITAL_COMMUNITY): Payer: Self-pay

## 2017-10-02 ENCOUNTER — Encounter (HOSPITAL_COMMUNITY): Payer: Self-pay

## 2017-10-07 ENCOUNTER — Encounter (HOSPITAL_COMMUNITY): Payer: Self-pay

## 2017-10-08 ENCOUNTER — Encounter (HOSPITAL_COMMUNITY): Payer: Self-pay

## 2017-10-09 ENCOUNTER — Encounter (HOSPITAL_COMMUNITY): Payer: Self-pay

## 2017-10-14 ENCOUNTER — Encounter (HOSPITAL_COMMUNITY): Payer: Self-pay

## 2017-10-15 ENCOUNTER — Encounter (HOSPITAL_COMMUNITY): Payer: Self-pay

## 2017-10-17 ENCOUNTER — Ambulatory Visit (INDEPENDENT_AMBULATORY_CARE_PROVIDER_SITE_OTHER): Payer: Medicare Other | Admitting: Pharmacist

## 2017-10-17 DIAGNOSIS — Z7901 Long term (current) use of anticoagulants: Secondary | ICD-10-CM

## 2017-10-17 DIAGNOSIS — I359 Nonrheumatic aortic valve disorder, unspecified: Secondary | ICD-10-CM

## 2017-10-17 LAB — POCT INR: INR: 3.7 — AB (ref 2.0–3.0)

## 2017-11-14 ENCOUNTER — Ambulatory Visit (INDEPENDENT_AMBULATORY_CARE_PROVIDER_SITE_OTHER): Payer: Medicare Other | Admitting: Pharmacist

## 2017-11-14 DIAGNOSIS — Z7901 Long term (current) use of anticoagulants: Secondary | ICD-10-CM

## 2017-11-14 DIAGNOSIS — I359 Nonrheumatic aortic valve disorder, unspecified: Secondary | ICD-10-CM

## 2017-11-14 LAB — POCT INR: INR: 2.5 (ref 2.0–3.0)

## 2017-11-29 DIAGNOSIS — F338 Other recurrent depressive disorders: Secondary | ICD-10-CM | POA: Insufficient documentation

## 2017-12-04 ENCOUNTER — Encounter: Payer: Self-pay | Admitting: Cardiology

## 2017-12-12 ENCOUNTER — Ambulatory Visit (INDEPENDENT_AMBULATORY_CARE_PROVIDER_SITE_OTHER): Payer: Medicare Other | Admitting: Pharmacist Clinician (PhC)/ Clinical Pharmacy Specialist

## 2017-12-12 DIAGNOSIS — Z7901 Long term (current) use of anticoagulants: Secondary | ICD-10-CM | POA: Diagnosis not present

## 2017-12-12 DIAGNOSIS — I359 Nonrheumatic aortic valve disorder, unspecified: Secondary | ICD-10-CM

## 2017-12-12 LAB — CBC WITH DIFFERENTIAL/PLATELET
BASOS ABS: 0 10*3/uL (ref 0.0–0.2)
Basos: 1 %
EOS (ABSOLUTE): 0.1 10*3/uL (ref 0.0–0.4)
EOS: 3 %
HEMATOCRIT: 39.6 % (ref 34.0–46.6)
Hemoglobin: 13.4 g/dL (ref 11.1–15.9)
IMMATURE GRANULOCYTES: 0 %
Immature Grans (Abs): 0 10*3/uL (ref 0.0–0.1)
LYMPHS ABS: 1.8 10*3/uL (ref 0.7–3.1)
Lymphs: 45 %
MCH: 28.3 pg (ref 26.6–33.0)
MCHC: 33.8 g/dL (ref 31.5–35.7)
MCV: 84 fL (ref 79–97)
Monocytes Absolute: 0.6 10*3/uL (ref 0.1–0.9)
Monocytes: 14 %
NEUTROS PCT: 37 %
Neutrophils Absolute: 1.5 10*3/uL (ref 1.4–7.0)
Platelets: 235 10*3/uL (ref 150–450)
RBC: 4.74 x10E6/uL (ref 3.77–5.28)
RDW: 13.4 % (ref 12.3–15.4)
WBC: 4 10*3/uL (ref 3.4–10.8)

## 2017-12-12 LAB — POCT INR: INR: 1.8 — AB (ref 2.0–3.0)

## 2017-12-12 LAB — BASIC METABOLIC PANEL
BUN/Creatinine Ratio: 18 (ref 12–28)
BUN: 16 mg/dL (ref 8–27)
CALCIUM: 9.6 mg/dL (ref 8.7–10.3)
CO2: 26 mmol/L (ref 20–29)
CREATININE: 0.87 mg/dL (ref 0.57–1.00)
Chloride: 105 mmol/L (ref 96–106)
GFR, EST AFRICAN AMERICAN: 74 mL/min/{1.73_m2} (ref >59)
GFR, EST NON AFRICAN AMERICAN: 64 mL/min/{1.73_m2} (ref >59)
Glucose: 88 mg/dL (ref 65–99)
Potassium: 4.4 mmol/L (ref 3.5–5.2)
Sodium: 145 mmol/L — ABNORMAL HIGH (ref 134–144)

## 2017-12-12 LAB — HEPATIC FUNCTION PANEL
ALT: 26 IU/L (ref 0–32)
AST: 27 IU/L (ref 0–40)
Albumin: 4.4 g/dL (ref 3.5–4.8)
Alkaline Phosphatase: 82 IU/L (ref 39–117)
BILIRUBIN, DIRECT: 0.2 mg/dL (ref 0.00–0.40)
Bilirubin Total: 0.8 mg/dL (ref 0.0–1.2)
TOTAL PROTEIN: 6.6 g/dL (ref 6.0–8.5)

## 2017-12-12 LAB — LIPID PANEL W/O CHOL/HDL RATIO
CHOLESTEROL TOTAL: 134 mg/dL (ref 100–199)
HDL: 52 mg/dL (ref >39)
LDL CALC: 69 mg/dL (ref 0–99)
TRIGLYCERIDES: 64 mg/dL (ref 0–149)
VLDL Cholesterol Cal: 13 mg/dL (ref 5–40)

## 2017-12-15 NOTE — Progress Notes (Signed)
Tamara Patterson Date of Birth: [birth year]   History of Present Illness: Tamara Patterson is seen today for followup. 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 This included mechanical aortic valve replacement and grafting of the aortic root with a #23 mm St. Jude valve conduit. She also had coronary bypass surgery including an IMA graft to the LAD, saphenous vein graft to the right coronary, and saphenous vein graft to the first and second obtuse marginal vessels. 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 She was treated with pulse steroids for a period of time and was weaned off of this. Last Myoview study was in January 2018 and was normal. Echo in 2011 was satisfactory.  On follow up today she is doing very well. She denies any chest pain or SOB. No edema or palpitations. States a home nurse checked her A1c and it was 7.3%. She has since lost 9 lbs and blood sugars have been normal.   She has been intolerant of statins and Zetia due to myalgias. Also intolerant of Welchol due to hypoglycemia. She is exercising regularly. BP has been under control.  Did not take her meds this am.   Current Outpatient Medications on File Prior to Visit  Medication Sig Dispense Refill  . amoxicillin (AMOXIL) 875 MG tablet Take 1 tablet (875 mg total) by mouth 2 (two) times daily. 20 tablet 0  . Calcium Carbonate (CALCIUM 600 PO) Take 1 tablet by mouth daily.    . Cholecalciferol (VITAMIN D3) 2000 units TABS Take 2,000 Units by mouth daily.    Marland Kitchen escitalopram (LEXAPRO) 20 MG tablet Take 20 mg by mouth daily.    Marland Kitchen MAGNESIUM MALATE PO Take 2 capsules by mouth daily.    . NON FORMULARY Take 1 capsule by mouth daily. "Anxiety Free" B complex supplement    . Omega-3 Fatty Acids (FISH OIL) 1000 MG CAPS Take 2,000 mg by mouth daily.    Marland Kitchen POTASSIUM PO Take 2 capsules by mouth daily as needed (supplement).    . raNITIdine HCl (ZANTAC PO) Take 1  tablet by mouth as needed.    . rosuvastatin (CRESTOR) 5 MG tablet TAKE ONE TABLET BY MOUTH ONCE DAILY 90 tablet 3  . warfarin (COUMADIN) 5 MG tablet TAKE 1 to 1 and 1/2  TABLETS BY MOUTH ONCE DAILY AS  DIRECTED  BY  THE  COUMADIN  CLINIC (Patient taking differently: Take by mouth See admin instructions. Taking 1.5 tabs 3 days/wk; taking 1 tab 4 days/wk) 135 tablet 1  . warfarin (COUMADIN) 5 MG tablet TAKE 1 & 1/2 (ONE & ONE-HALF) TABLETS BY MOUTH ONCE DAILY AS DIRECTED BY  COUMAIN  CLINIC 135 tablet 1  . amLODipine (NORVASC) 2.5 MG tablet Take 1 tablet (2.5 mg total) by mouth daily. 30 tablet 6   No current facility-administered medications on file prior to visit.     Allergies  Allergen Reactions  . Codeine Nausea Only  . Lipitor [Atorvastatin Calcium] Other (See Comments)    Caused muscle aching  . Atenolol Other (See Comments)    cramps    Past Medical History:  Diagnosis Date  . Anticoagulant long-term use   . CAD (coronary artery disease)   . Depression    seasonal  . Dyslipidemia   . GERD (gastroesophageal reflux disease)   . Giant cell arteritis (HCC)    per aortotomy biopsy in 1991  . HTN (hypertension)   .  Hx of CABG 1991   LIMA-LAD, SVG-RCA, SVG-1st and 2ndOM  . Panic anxiety syndrome   . S/P AVR (aortic valve replacement)    with root grafting; #61mm  St. Jude valve conduit; Last Echo 2007    Past Surgical History:  Procedure Laterality Date  . AORTIC VALVE REPLACEMENT     with root grafting  . CARDIAC SURGERY    . CHOLECYSTECTOMY    . CORONARY ARTERY BYPASS GRAFT      Social History   Tobacco Use  Smoking Status Former Smoker  Smokeless Tobacco Never Used    Social History   Substance and Sexual Activity  Alcohol Use No    Family History  Problem Relation Age of Onset  . Liver disease Mother   . Pancreatic cancer Father     Review of Systems: As noted in history of present illness. All other systems were reviewed and are  negative.  Physical Exam: BP (!) 146/82   Pulse 63   Ht 5\' 5"  (1.651 m)   Wt 153 lb (69.4 kg)   BMI 25.46 kg/m  GENERAL:  Well appearing BF in NAD HEENT:  PERRL, EOMI, sclera are clear. Oropharynx is clear. NECK:  No jugular venous distention, carotid upstroke brisk and symmetric, no bruits, no thyromegaly or adenopathy LUNGS:  Clear to auscultation bilaterally CHEST:  Unremarkable HEART:  RRR,  PMI not displaced or sustained,good mechanical AV click, no S3, no S4: no clicks, no rubs, no murmurs ABD:  Soft, nontender. BS +, no masses or bruits. No hepatomegaly, no splenomegaly EXT:  2 + pulses throughout, no edema, no cyanosis no clubbing SKIN:  Warm and dry.  No rashes NEURO:  Alert and oriented x 3. Cranial nerves II through XII intact. PSYCH:  Cognitively intact    LABORATORY DATA:  Lab Results  Component Value Date   WBC 4.0 12/12/2017   HGB 13.4 12/12/2017   HCT 39.6 12/12/2017   PLT 235 12/12/2017   GLUCOSE 88 12/12/2017   CHOL 134 12/12/2017   TRIG 64 12/12/2017   HDL 52 12/12/2017   LDLDIRECT 126.4 10/07/2012   LDLCALC 69 12/12/2017   ALT 26 12/12/2017   AST 27 12/12/2017   NA 145 (H) 12/12/2017   K 4.4 12/12/2017   CL 105 12/12/2017   CREATININE 0.87 12/12/2017   BUN 16 12/12/2017   CO2 26 12/12/2017   INR 1.8 (A) 12/12/2017   Labs from primary care dated 05/02/15: normal CMET Dated 05/25/15: A1c5.8% Dated 11/10/15: cholesterol 177, triglycerides 91, LDL 110, HDL 49.  Ecg today shows NSR with LAFB, RBBB. LVH. I have personally reviewed and interpreted this study.  Myoview 03/20/16: Study Highlights     The left ventricular ejection fraction is normal (55-65%).  Nuclear stress EF: 64%.  There was no ST segment deviation noted during stress.  Blood pressure demonstrated a normal response to exercise.   Normal study, no evidence for ischemia or infarction     Assessment / Plan: 1. Status post mechanical aortic valve replacement.  Exam is stable.  INR following in our coumadin clinic. Routine SBE prophylaxis.   2. Status post CABG for ostial coronary disease. No angina. Myoview 03/20/16 was normal. Continue current medical therapy.   3. Hyperlipidemia. LDL improved to 69.   On very low dose Crestor. Intolerant of multiple meds.   4. Right bundle branch block/LAFB- chronic. She is asymptomatic.  5. HTN generally well controlled. Mildly elevated today but hasn't taken her medication.   I  will follow up in 6 months

## 2017-12-17 ENCOUNTER — Encounter: Payer: Self-pay | Admitting: Cardiology

## 2017-12-17 ENCOUNTER — Ambulatory Visit (INDEPENDENT_AMBULATORY_CARE_PROVIDER_SITE_OTHER): Payer: Medicare Other | Admitting: Cardiology

## 2017-12-17 VITALS — BP 146/82 | HR 63 | Ht 65.0 in | Wt 153.0 lb

## 2017-12-17 DIAGNOSIS — I10 Essential (primary) hypertension: Secondary | ICD-10-CM | POA: Diagnosis not present

## 2017-12-17 DIAGNOSIS — Z951 Presence of aortocoronary bypass graft: Secondary | ICD-10-CM

## 2017-12-17 DIAGNOSIS — I739 Peripheral vascular disease, unspecified: Secondary | ICD-10-CM

## 2017-12-17 DIAGNOSIS — Z952 Presence of prosthetic heart valve: Secondary | ICD-10-CM

## 2017-12-17 DIAGNOSIS — I359 Nonrheumatic aortic valve disorder, unspecified: Secondary | ICD-10-CM

## 2017-12-17 DIAGNOSIS — E785 Hyperlipidemia, unspecified: Secondary | ICD-10-CM

## 2017-12-17 NOTE — Patient Instructions (Signed)
Continue your current therapy  I will see you in 6 months.   

## 2017-12-18 ENCOUNTER — Ambulatory Visit (INDEPENDENT_AMBULATORY_CARE_PROVIDER_SITE_OTHER): Payer: Self-pay | Admitting: Psychiatry

## 2017-12-18 DIAGNOSIS — F411 Generalized anxiety disorder: Secondary | ICD-10-CM

## 2017-12-18 DIAGNOSIS — F331 Major depressive disorder, recurrent, moderate: Secondary | ICD-10-CM

## 2017-12-18 MED ORDER — ESCITALOPRAM OXALATE 20 MG PO TABS: 20.0000 mg | ORAL_TABLET | Freq: Every day | ORAL | 3 refills | Status: DC

## 2017-12-18 NOTE — Progress Notes (Signed)
[facility]  Patient ID: Tamara Patterson,  MRN: [id number]  PCP: Deatra James, MD  Date of Evaluation: 12/18/2017 Time spent:15 minutes   HISTORY/CURRENT STATUS: HPI   Fine with depression anxiety. ? Re: Lexapro on glucose.  Individual Medical History/ Review of Systems: Changes? :Yes Worry re: BS increasing dt meds for no clear reason.  Last panic October 2017.  Hx seasonality.  Mood ok.  Can control it bc understands it..  Allergies: Codeine; Lipitor [atorvastatin calcium]; and Atenolol  Current Medications:  Current Outpatient Medications:  .  Cholecalciferol (VITAMIN D3) 2000 units TABS, Take 2,000 Units by mouth daily., Disp: , Rfl:  .  escitalopram (LEXAPRO) 20 MG tablet, Take 20 mg by mouth daily., Disp: , Rfl:  .  MAGNESIUM MALATE PO, Take 2 capsules by mouth daily., Disp: , Rfl:  .  NON FORMULARY, Take 1 capsule by mouth daily. "Anxiety Free" B complex supplement, Disp: , Rfl:  .  Omega-3 Fatty Acids (FISH OIL) 1000 MG CAPS, Take 2,000 mg by mouth daily., Disp: , Rfl:  .  POTASSIUM PO, Take 2 capsules by mouth daily as needed (supplement)., Disp: , Rfl:  .  rosuvastatin (CRESTOR) 5 MG tablet, TAKE ONE TABLET BY MOUTH ONCE DAILY, Disp: 90 tablet, Rfl: 3 .  warfarin (COUMADIN) 5 MG tablet, TAKE 1 to 1 and 1/2  TABLETS BY MOUTH ONCE DAILY AS  DIRECTED  BY  THE  COUMADIN  CLINIC (Patient taking differently: Take by mouth See admin instructions. Taking 1.5 tabs 3 days/wk; taking 1 tab 4 days/wk), Disp: 135 tablet, Rfl: 1 .  amLODipine (NORVASC) 2.5 MG tablet, Take 1 tablet (2.5 mg total) by mouth daily., Disp: 30 tablet, Rfl: 6 .  amoxicillin (AMOXIL) 875 MG tablet, Take 1 tablet (875 mg total) by mouth 2 (two) times daily., Disp: 20 tablet, Rfl: 0 .  Calcium Carbonate (CALCIUM 600 PO), Take 1 tablet by mouth daily., Disp: , Rfl:  .  raNITIdine HCl (ZANTAC PO), Take 1 tablet by mouth as needed., Disp: , Rfl:  .  warfarin (COUMADIN) 5 MG tablet, TAKE 1 & 1/2 (ONE &  ONE-HALF) TABLETS BY MOUTH ONCE DAILY AS DIRECTED BY  C[facility]MAIN  CLINIC, Disp: 135 tablet, Rfl: 1 Medication Side Effects: None  Family Medical/ Social History: Changes? Yes trying to diet and exercise.  FBS 100.  MENTAL HEALTH EXAM:  There were no vitals taken for this visit.There is no height or weight on file to calculate BMI.  General Appearance: Neat  Eye Contact:  Good  Speech:  Normal Rate  Volume:  Normal  Mood:  Euthymic  Affect:  Congruent  Thought Process:  Coherent  Orientation:  Full (Time, Place, and Person)  Thought Content: WDL   Suicidal Thoughts:  No  Homicidal Thoughts:  No  Memory:  Recent  Judgement:  Good  Insight:  Good  Psychomotor Activity:  Normal  Concentration:  Concentration: Good  Recall:  Good  Fund of Knowledge: Good  Language: Good  Akathisia:  No  AIMS (if indicated): not done  Assets:  Communication Skills Leisure Time Physical Health Social Support Vocational/Educational  ADL's:  Intact  Cognition: WNL  Prognosis:  Good    DIAGNOSES: No diagnosis found.   RECOMMENDATIONS: Vitamin D and seasonal depression discussed in detail.  Continue meds dt high relapse risk  Use lightbox in winter dt seasonality.  Helped last year.     Lauraine Rinne, MD

## 2017-12-26 ENCOUNTER — Ambulatory Visit (INDEPENDENT_AMBULATORY_CARE_PROVIDER_SITE_OTHER): Payer: Medicare Other | Admitting: Pharmacist Clinician (PhC)/ Clinical Pharmacy Specialist

## 2017-12-26 DIAGNOSIS — Z7901 Long term (current) use of anticoagulants: Secondary | ICD-10-CM | POA: Diagnosis not present

## 2017-12-26 DIAGNOSIS — Z952 Presence of prosthetic heart valve: Secondary | ICD-10-CM

## 2017-12-26 DIAGNOSIS — I359 Nonrheumatic aortic valve disorder, unspecified: Secondary | ICD-10-CM

## 2017-12-26 LAB — POCT INR: INR: 1.6 — AB (ref 2.0–3.0)

## 2017-12-26 NOTE — Patient Instructions (Signed)
Description   Take 2 tablets today Friday Octo 11, then increase dose to  1.5 tables daily except 1 tablet each Sunday, Tuesday and Thursday.   Repeat INR in 2 weeks *Call clinic at [phone] to report changes and/or procedures*

## 2018-01-06 ENCOUNTER — Telehealth: Payer: Self-pay

## 2018-01-06 NOTE — Telephone Encounter (Signed)
   [facility] Pre-operative Risk Assessment    Request for surgical clearance:  1. What type of surgery is being performed? Colonoscopy  2. When is this surgery scheduled? 02/23/18   3. What type of clearance is required (medical clearance vs. Pharmacy clearance to hold med vs. Both)? Pharmacy   4. Are there any medications that need to be held prior to surgery and how long? Coumadin  5. Practice name and name of physician performing surgery? [facility]  Dr.Jyothi Mann  6. What is your office phone number [phone]   7.   What is your office fax number [phone]  8.   Anesthesia type (None, local, MAC, general) ? Propofol   Thedore Mins Jashira Cotugno 01/06/2018, 2:50 PM  _________________________________________________________________   (provider comments below)

## 2018-01-08 NOTE — Telephone Encounter (Signed)
   Primary Cardiologist: Dr Swaziland  Chart reviewed as part of pre-operative protocol coverage. Given past medical history and time since last visit, based on ACC/AHA guidelines, Tamara Patterson would be at acceptable risk for the planned procedure without further cardiovascular testing.   OK to hold Coumadin 5 days pre op if needed. No Lovenox bridge. Resume on the PM post procedure if possible.  I will route this recommendation to the requesting party via Epic fax function and remove from pre-op pool.  Please call with questions.  Corine Shelter, PA-C 01/08/2018, 2:51 PM

## 2018-01-08 NOTE — Telephone Encounter (Signed)
Patient with diagnosis of mechanical aortic valve on warfarin for anticoagulation.    Procedure: colonoscopy Date of procedure: 02/23/18  CrCl 58.4 Platelet count 235  Per office protocol, patient can hold warfarin for 5 days prior to procedure.   Patient will not need bridging with Lovenox (enoxaparin) around procedure.  Patient should restart warfarin on the evening of procedure or day after, at discretion of procedure MD

## 2018-01-09 ENCOUNTER — Other Ambulatory Visit: Payer: Self-pay | Admitting: Cardiology

## 2018-01-09 NOTE — Telephone Encounter (Signed)
Rx request sent to pharmacy.  

## 2018-01-12 ENCOUNTER — Ambulatory Visit (INDEPENDENT_AMBULATORY_CARE_PROVIDER_SITE_OTHER): Payer: Medicare Other | Admitting: Pharmacist

## 2018-01-12 DIAGNOSIS — I359 Nonrheumatic aortic valve disorder, unspecified: Secondary | ICD-10-CM

## 2018-01-12 DIAGNOSIS — Z7901 Long term (current) use of anticoagulants: Secondary | ICD-10-CM

## 2018-01-12 LAB — POCT INR: INR: 2.2 (ref 2.0–3.0)

## 2018-01-26 IMAGING — CR DG CHEST 2V
2 series · 2 of 2 positions shown · non-contrast
Comparison: Radiographs April 13, 2013.

CLINICAL DATA: Palpitations.

EXAM:
CHEST  2 VIEW

[chest pa]
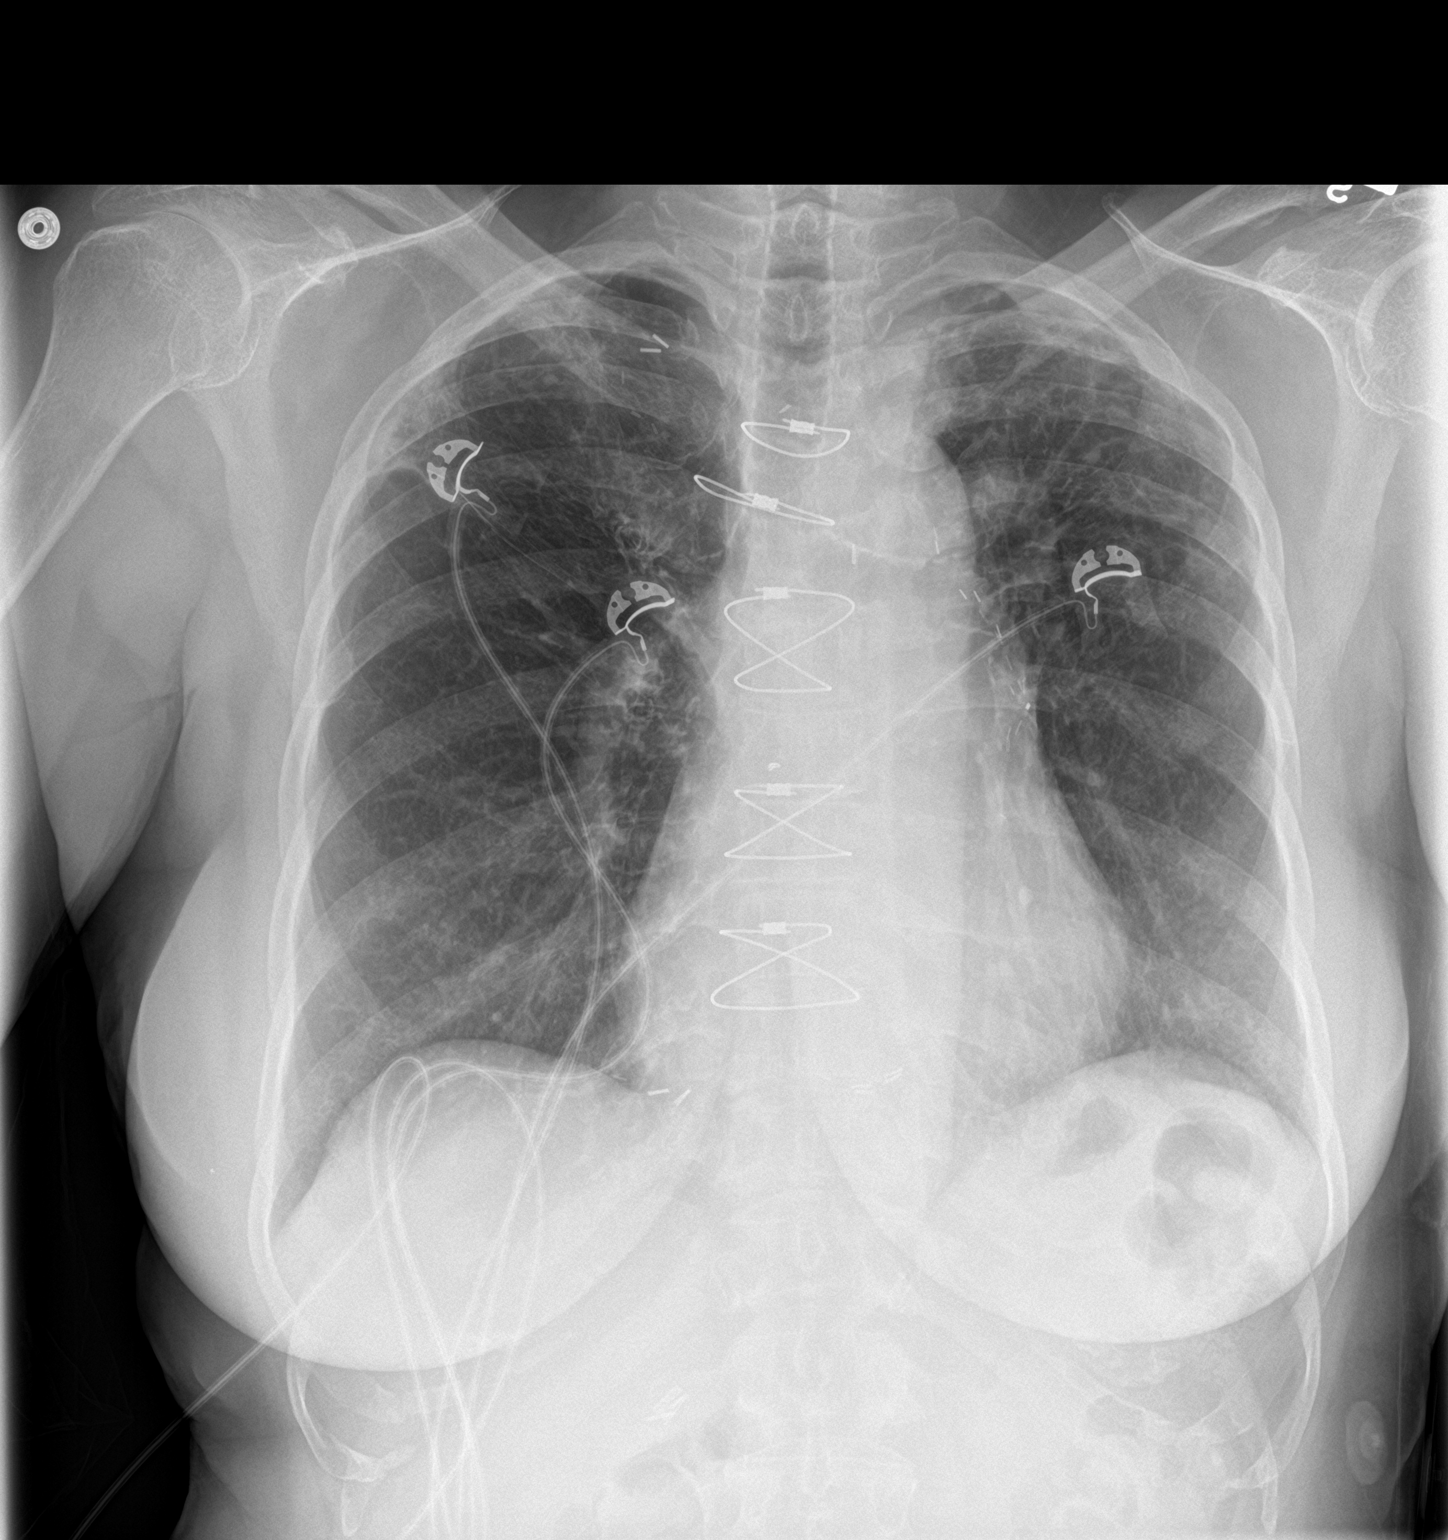

[chest lat]
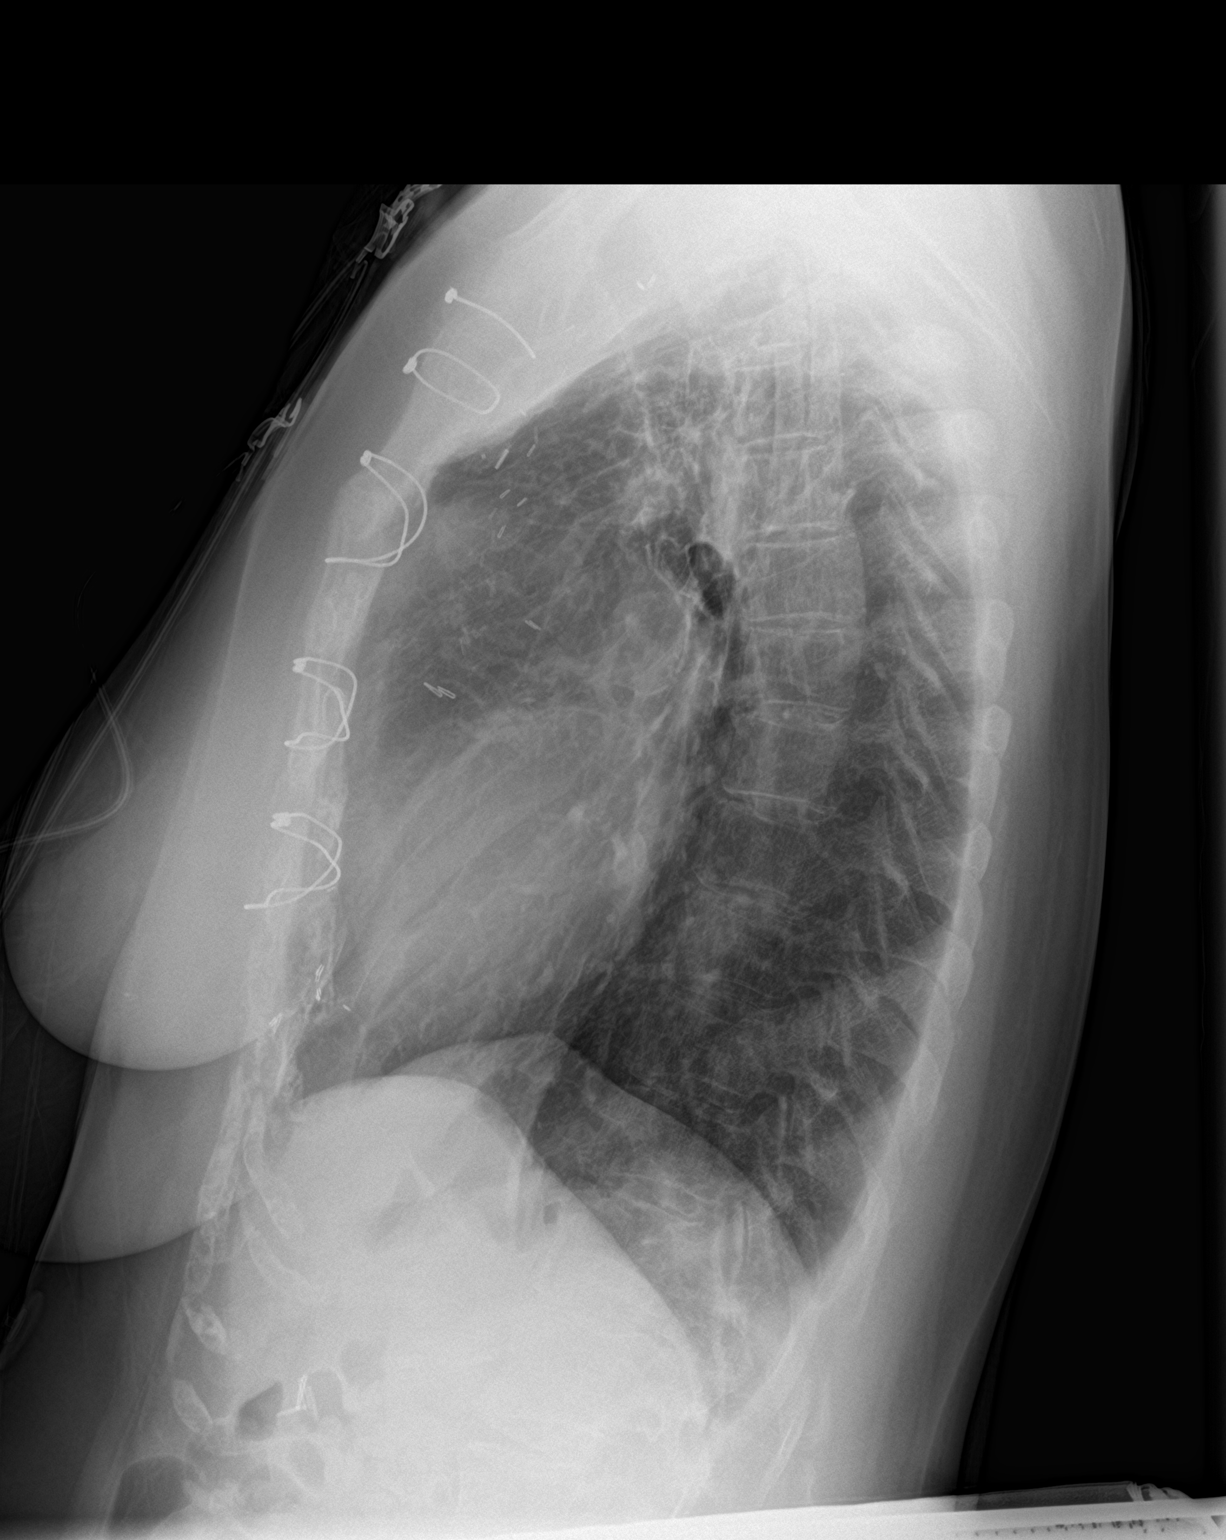

[2 of 2 positions shown; findings below may reference images not displayed]

FINDINGS: Stable cardiomediastinal silhouette. No pneumothorax or pleural
effusion is noted. Bilateral upper lobe scarring is noted.
Sternotomy wires and left hilar surgical clips are noted. No acute
pulmonary disease is noted. Bony thorax is unremarkable.
IMPRESSION: Stable biapical scarring. Postsurgical changes are noted. No acute
abnormality seen.

## 2018-02-09 ENCOUNTER — Ambulatory Visit (INDEPENDENT_AMBULATORY_CARE_PROVIDER_SITE_OTHER): Payer: Medicare Other | Admitting: Pharmacist

## 2018-02-09 DIAGNOSIS — Z7901 Long term (current) use of anticoagulants: Secondary | ICD-10-CM

## 2018-02-09 LAB — POCT INR: INR: 2.1 (ref 2.0–3.0)

## 2018-03-09 ENCOUNTER — Ambulatory Visit (INDEPENDENT_AMBULATORY_CARE_PROVIDER_SITE_OTHER): Payer: Medicare Other | Admitting: Pharmacist Clinician (PhC)/ Clinical Pharmacy Specialist

## 2018-03-09 DIAGNOSIS — Z7901 Long term (current) use of anticoagulants: Secondary | ICD-10-CM

## 2018-03-09 DIAGNOSIS — I359 Nonrheumatic aortic valve disorder, unspecified: Secondary | ICD-10-CM

## 2018-03-09 DIAGNOSIS — Z952 Presence of prosthetic heart valve: Secondary | ICD-10-CM | POA: Diagnosis not present

## 2018-03-09 LAB — POCT INR: INR: 2.4 (ref 2.0–3.0)

## 2018-04-06 ENCOUNTER — Ambulatory Visit (INDEPENDENT_AMBULATORY_CARE_PROVIDER_SITE_OTHER): Payer: Medicare Other | Admitting: Pharmacist

## 2018-04-06 DIAGNOSIS — Z7901 Long term (current) use of anticoagulants: Secondary | ICD-10-CM | POA: Diagnosis not present

## 2018-04-06 DIAGNOSIS — I359 Nonrheumatic aortic valve disorder, unspecified: Secondary | ICD-10-CM

## 2018-04-06 LAB — POCT INR: INR: 3.3 — AB (ref 2.0–3.0)

## 2018-04-22 ENCOUNTER — Other Ambulatory Visit: Payer: Self-pay | Admitting: Cardiology

## 2018-04-24 ENCOUNTER — Ambulatory Visit (INDEPENDENT_AMBULATORY_CARE_PROVIDER_SITE_OTHER): Payer: Medicare Other | Admitting: Pharmacist Clinician (PhC)/ Clinical Pharmacy Specialist

## 2018-04-24 DIAGNOSIS — I359 Nonrheumatic aortic valve disorder, unspecified: Secondary | ICD-10-CM

## 2018-04-24 DIAGNOSIS — Z7901 Long term (current) use of anticoagulants: Secondary | ICD-10-CM

## 2018-04-24 LAB — POCT INR: INR: 2.6 (ref 2.0–3.0)

## 2018-05-06 ENCOUNTER — Telehealth: Payer: Self-pay | Admitting: *Deleted

## 2018-05-06 NOTE — Telephone Encounter (Signed)
   [facility] Pre-operative Risk Assessment    Request for surgical clearance:  1. What type of surgery is being performed? LEFT EYE CATARACT EXTRACTION W/INTRAOCULAR LENS IMPLANT FOLLOWED BY THE RIGHT EYE   2. When is this surgery scheduled? 05/25/18 LEFT EYE AND 06/15/18 RIGHT EYE   3. What type of clearance is required (medical clearance vs. Pharmacy clearance to hold med vs. Both)? MEDICAL  4. Are there any medications that need to be held prior to surgery and how long?PT DOES NOT NEED TO NEED TO STOP ANY MEDICATIONS   5. Practice name and name of physician performing surgery? [facility]; DR. Christia Reading BEVIS   6. What is your office phone number [phone]    7.   What is your office fax number [phone]  8.   Anesthesia type (None, local, MAC, general) ? TOPICAL WITH IV MEDICATION   Julaine Hua 05/06/2018, 4:14 PM  _________________________________________________________________   (provider comments below)

## 2018-05-07 NOTE — Telephone Encounter (Signed)
.     Primary Cardiologist: Peter Swaziland, MD  Chart reviewed as part of pre-operative protocol coverage. Cataract extractions are recognized in guidelines as low risk surgeries that do not typically require specific preoperative testing or holding of blood thinner therapy. Therefore, given past medical history and time since last visit, based on ACC/AHA guidelines, Tamara Patterson would be at acceptable risk for the planned procedure without further cardiovascular testing.   I will route this recommendation to the requesting party via Epic fax function and remove from pre-op pool.  Please call with questions.  Robbie Lis, PA-C 05/07/2018, 8:43 AM

## 2018-05-08 ENCOUNTER — Telehealth: Payer: Self-pay | Admitting: Cardiology

## 2018-05-08 NOTE — Telephone Encounter (Signed)
New Message   Rosey Bath from [facility] needs Clearance faxed to [phone].

## 2018-05-08 NOTE — Telephone Encounter (Signed)
Rosey Bath received new fax

## 2018-05-22 ENCOUNTER — Ambulatory Visit (INDEPENDENT_AMBULATORY_CARE_PROVIDER_SITE_OTHER): Payer: Medicare Other | Admitting: Pharmacist Clinician (PhC)/ Clinical Pharmacy Specialist

## 2018-05-22 DIAGNOSIS — Z952 Presence of prosthetic heart valve: Secondary | ICD-10-CM

## 2018-05-22 DIAGNOSIS — Z7901 Long term (current) use of anticoagulants: Secondary | ICD-10-CM

## 2018-05-22 DIAGNOSIS — I359 Nonrheumatic aortic valve disorder, unspecified: Secondary | ICD-10-CM

## 2018-05-22 LAB — POCT INR: INR: 3.3 — AB (ref 2.0–3.0)

## 2018-06-17 ENCOUNTER — Telehealth: Payer: Self-pay | Admitting: Pharmacist Clinician (PhC)/ Clinical Pharmacy Specialist

## 2018-06-17 NOTE — Telephone Encounter (Signed)

## 2018-06-19 ENCOUNTER — Ambulatory Visit (INDEPENDENT_AMBULATORY_CARE_PROVIDER_SITE_OTHER): Payer: Medicare Other | Admitting: Pharmacist

## 2018-06-19 ENCOUNTER — Other Ambulatory Visit: Payer: Self-pay

## 2018-06-19 DIAGNOSIS — I359 Nonrheumatic aortic valve disorder, unspecified: Secondary | ICD-10-CM | POA: Diagnosis not present

## 2018-06-19 DIAGNOSIS — Z7901 Long term (current) use of anticoagulants: Secondary | ICD-10-CM | POA: Diagnosis not present

## 2018-06-19 LAB — POCT INR: INR: 3.2 — AB (ref 2.0–3.0)

## 2018-07-16 ENCOUNTER — Telehealth: Payer: Self-pay

## 2018-07-16 NOTE — Telephone Encounter (Signed)
lmom for prescreen  

## 2018-07-16 NOTE — Telephone Encounter (Signed)

## 2018-07-17 ENCOUNTER — Ambulatory Visit (INDEPENDENT_AMBULATORY_CARE_PROVIDER_SITE_OTHER): Payer: Medicare Other | Admitting: Pharmacist

## 2018-07-17 ENCOUNTER — Other Ambulatory Visit: Payer: Self-pay

## 2018-07-17 DIAGNOSIS — I359 Nonrheumatic aortic valve disorder, unspecified: Secondary | ICD-10-CM

## 2018-07-17 DIAGNOSIS — Z7901 Long term (current) use of anticoagulants: Secondary | ICD-10-CM | POA: Diagnosis not present

## 2018-07-17 LAB — POCT INR: INR: 4.2 — AB (ref 2.0–3.0)

## 2018-08-02 ENCOUNTER — Other Ambulatory Visit: Payer: Self-pay | Admitting: Cardiology

## 2018-08-06 ENCOUNTER — Telehealth: Payer: Self-pay

## 2018-08-06 NOTE — Telephone Encounter (Signed)
lmom for prescreen  

## 2018-08-25 ENCOUNTER — Telehealth: Payer: Self-pay

## 2018-08-25 NOTE — Telephone Encounter (Signed)
Unable to lmom for prescreen b/c memory is full

## 2018-08-31 ENCOUNTER — Ambulatory Visit (INDEPENDENT_AMBULATORY_CARE_PROVIDER_SITE_OTHER): Payer: Medicare Other | Admitting: Pharmacist Clinician (PhC)/ Clinical Pharmacy Specialist

## 2018-08-31 ENCOUNTER — Other Ambulatory Visit: Payer: Self-pay

## 2018-08-31 DIAGNOSIS — Z7901 Long term (current) use of anticoagulants: Secondary | ICD-10-CM

## 2018-08-31 DIAGNOSIS — I359 Nonrheumatic aortic valve disorder, unspecified: Secondary | ICD-10-CM | POA: Diagnosis not present

## 2018-08-31 LAB — POCT INR: INR: 3.6 — AB (ref 2.0–3.0)

## 2018-09-09 ENCOUNTER — Telehealth: Payer: Self-pay

## 2018-09-09 NOTE — Telephone Encounter (Signed)
Called to reschedue appt due to provider cpp pharmd not going to be in the office until later that  Day and the pt was compliant to come 09/22/18 @ 1pm

## 2018-09-15 ENCOUNTER — Telehealth: Payer: Self-pay

## 2018-09-15 NOTE — Telephone Encounter (Signed)
Unable to lmom for prescreen vmbox full 

## 2018-09-22 ENCOUNTER — Other Ambulatory Visit: Payer: Self-pay

## 2018-09-22 ENCOUNTER — Ambulatory Visit (INDEPENDENT_AMBULATORY_CARE_PROVIDER_SITE_OTHER): Payer: Medicare Other | Admitting: Pharmacist

## 2018-09-22 DIAGNOSIS — Z7901 Long term (current) use of anticoagulants: Secondary | ICD-10-CM

## 2018-09-22 DIAGNOSIS — I359 Nonrheumatic aortic valve disorder, unspecified: Secondary | ICD-10-CM

## 2018-09-22 LAB — POCT INR: INR: 2.8 (ref 2.0–3.0)

## 2018-10-22 ENCOUNTER — Other Ambulatory Visit: Payer: Self-pay

## 2018-10-22 ENCOUNTER — Ambulatory Visit (INDEPENDENT_AMBULATORY_CARE_PROVIDER_SITE_OTHER): Payer: Medicare Other | Admitting: Pharmacist

## 2018-10-22 DIAGNOSIS — Z7901 Long term (current) use of anticoagulants: Secondary | ICD-10-CM | POA: Diagnosis not present

## 2018-10-22 DIAGNOSIS — I359 Nonrheumatic aortic valve disorder, unspecified: Secondary | ICD-10-CM | POA: Diagnosis not present

## 2018-10-22 LAB — POCT INR: INR: 2.4 (ref 2.0–3.0)

## 2018-10-24 ENCOUNTER — Other Ambulatory Visit: Payer: Self-pay | Admitting: Cardiology

## 2018-11-03 NOTE — Progress Notes (Signed)
Tamara SchwalbeAnne S Patterson Date of Birth: 11/20/1940   History of Present Illness: Tamara Patterson is seen today for followup. 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 This included mechanical aortic valve replacement and grafting of the aortic root with a #23 mm St. Jude valve conduit. She also had coronary bypass surgery including an IMA graft to the LAD, saphenous vein graft to the right coronary, and saphenous vein graft to the first and second obtuse marginal vessels. 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 She was treated with pulse steroids for a period of time and was weaned off of this. Last Myoview study was in January 2018 and was normal. Echo in 2011 was satisfactory.  She has been intolerant of statins and Zetia due to myalgias. Also intolerant of Welchol due to hypoglycemia.  On follow up today she is doing well. Only notes aches in her bones and joints. No chest pain, dyspnea, palpitations, dizziness.  No edema. No bleeding.  Current Outpatient Medications on File Prior to Visit  Medication Sig Dispense Refill  . amLODipine (NORVASC) 2.5 MG tablet Take 1 tablet by mouth once daily 30 tablet 0  . amoxicillin (AMOXIL) 875 MG tablet Take 1 tablet (875 mg total) by mouth 2 (two) times daily. 20 tablet 0  . Calcium Carbonate (CALCIUM 600 PO) Take 1 tablet by mouth daily.    . Cholecalciferol (VITAMIN D3) 2000 units TABS Take 2,000 Units by mouth daily.    Marland Kitchen. escitalopram (LEXAPRO) 20 MG tablet Take 1 tablet (20 mg total) by mouth daily. 90 tablet 3  . MAGNESIUM MALATE PO Take 2 capsules by mouth daily.    . NON FORMULARY Take 1 capsule by mouth daily. "Anxiety Free" B complex supplement    . Omega-3 Fatty Acids (FISH OIL) 1000 MG CAPS Take 2,000 mg by mouth daily.    Marland Kitchen. OVER THE COUNTER MEDICATION 1 tablet by Per post-pyloric tube route daily. Neuriva Brain Plus    . POTASSIUM PO Take 2 capsules by mouth daily as  needed (supplement).    . raNITIdine HCl (ZANTAC PO) Take 1 tablet by mouth as needed.    . rosuvastatin (CRESTOR) 5 MG tablet TAKE ONE TABLET BY MOUTH ONCE DAILY 90 tablet 3  . warfarin (COUMADIN) 5 MG tablet TAKE 1 to 1 and 1/2  TABLETS BY MOUTH ONCE DAILY AS  DIRECTED  BY  THE  COUMADIN  CLINIC (Patient taking differently: Take by mouth See admin instructions. Pt takes 1.5 tabs on Monday and Friday. She takes 1 tab the rest of the week) 135 tablet 1  . warfarin (COUMADIN) 5 MG tablet TAKE ONE AND ONE HALF TABLET BY MOUTH ONCE DAILY AS DIRECTED BY COUMADIN CLINIC 135 tablet 0   No current facility-administered medications on file prior to visit.     Allergies  Allergen Reactions  . Codeine Nausea Only  . Lipitor [Atorvastatin Calcium] Other (See Comments)    Caused muscle aching  . Atenolol Other (See Comments)    cramps    Past Medical History:  Diagnosis Date  . Anticoagulant long-term use   . CAD (coronary artery disease)   . Depression    seasonal  . Dyslipidemia   . GERD (gastroesophageal reflux disease)   . Giant cell arteritis (HCC)    per aortotomy biopsy in 1991  . HTN (hypertension)   . Hx of CABG 1991   LIMA-LAD, SVG-RCA, SVG-1st and  2ndOM  . Panic anxiety syndrome   . S/P AVR (aortic valve replacement)    with root grafting; #53mm  St. Jude valve conduit; Last Echo 2007    Past Surgical History:  Procedure Laterality Date  . AORTIC VALVE REPLACEMENT     with root grafting  . CARDIAC SURGERY    . CHOLECYSTECTOMY    . CORONARY ARTERY BYPASS GRAFT      Social History   Tobacco Use  Smoking Status Former Smoker  Smokeless Tobacco Never Used    Social History   Substance and Sexual Activity  Alcohol Use No    Family History  Problem Relation Age of Onset  . Liver disease Mother   . Pancreatic cancer Father     Review of Systems: As noted in history of present illness. All other systems were reviewed and are negative.  Physical Exam: BP (!)  148/62   Pulse 63   Temp (!) 96.6 F (35.9 C)   Ht 5\' 5"  (1.651 m)   Wt 158 lb 6.4 oz (71.8 kg)   SpO2 98%   BMI 26.36 kg/m  GENERAL:  Well appearing BF in NAD HEENT:  PERRL, EOMI, sclera are clear. Oropharynx is clear. NECK:  No jugular venous distention, carotid upstroke brisk and symmetric, no bruits, no thyromegaly or adenopathy LUNGS:  Clear to auscultation bilaterally CHEST:  Unremarkable HEART:  RRR,  PMI not displaced or sustained,good mechanical AV click, no S3, no S4: no clicks, no rubs, no murmurs ABD:  Soft, nontender. BS +, no masses or bruits. No hepatomegaly, no splenomegaly EXT:  2 + pulses throughout, no edema, no cyanosis no clubbing SKIN:  Warm and dry.  No rashes NEURO:  Alert and oriented x 3. Cranial nerves II through XII intact. PSYCH:  Cognitively intact    LABORATORY DATA:  Lab Results  Component Value Date   WBC 4.0 12/12/2017   HGB 13.4 12/12/2017   HCT 39.6 12/12/2017   PLT 235 12/12/2017   GLUCOSE 88 12/12/2017   CHOL 134 12/12/2017   TRIG 64 12/12/2017   HDL 52 12/12/2017   LDLDIRECT 126.4 10/07/2012   LDLCALC 69 12/12/2017   ALT 26 12/12/2017   AST 27 12/12/2017   NA 145 (H) 12/12/2017   K 4.4 12/12/2017   CL 105 12/12/2017   CREATININE 0.87 12/12/2017   BUN 16 12/12/2017   CO2 26 12/12/2017   INR 2.4 10/22/2018   Labs from primary care dated 05/02/15: normal CMET Dated 05/25/15: A1c5.8% Dated 11/10/15: cholesterol 177, triglycerides 91, LDL 110, HDL 49.  Ecg today shows NSR with LAFB, RBBB. LVH. Rate 59. I have personally reviewed and interpreted this study.  Myoview 03/20/16: Study Highlights     The left ventricular ejection fraction is normal (55-65%).  Nuclear stress EF: 64%.  There was no ST segment deviation noted during stress.  Blood pressure demonstrated a normal response to exercise.   Normal study, no evidence for ischemia or infarction     Assessment / Plan: 1. Status post mechanical aortic valve replacement.   Exam is stable. INR following in our coumadin clinic. Will check today. Routine SBE prophylaxis.   2. Status post CABG for ostial coronary disease. No angina. Myoview 03/20/16 was normal. Continue current medical therapy. She is asymptomatic.   3. Hyperlipidemia.   On very low dose Crestor. Intolerant of multiple meds. Will update labs today.  4. Right bundle branch block/LAFB- chronic. She is asymptomatic.  5. HTN  well controlled.  I will follow up in 6 months

## 2018-11-04 ENCOUNTER — Ambulatory Visit (INDEPENDENT_AMBULATORY_CARE_PROVIDER_SITE_OTHER): Payer: Medicare Other | Admitting: Pharmacist

## 2018-11-04 ENCOUNTER — Ambulatory Visit (INDEPENDENT_AMBULATORY_CARE_PROVIDER_SITE_OTHER): Payer: Medicare Other | Admitting: Cardiology

## 2018-11-04 ENCOUNTER — Encounter: Payer: Self-pay | Admitting: Cardiology

## 2018-11-04 ENCOUNTER — Other Ambulatory Visit: Payer: Self-pay

## 2018-11-04 VITALS — BP 148/62 | HR 63 | Temp 96.6°F | Ht 65.0 in | Wt 158.4 lb

## 2018-11-04 DIAGNOSIS — Z951 Presence of aortocoronary bypass graft: Secondary | ICD-10-CM | POA: Diagnosis not present

## 2018-11-04 DIAGNOSIS — Z952 Presence of prosthetic heart valve: Secondary | ICD-10-CM | POA: Diagnosis not present

## 2018-11-04 DIAGNOSIS — E785 Hyperlipidemia, unspecified: Secondary | ICD-10-CM | POA: Diagnosis not present

## 2018-11-04 DIAGNOSIS — I10 Essential (primary) hypertension: Secondary | ICD-10-CM | POA: Diagnosis not present

## 2018-11-04 DIAGNOSIS — I359 Nonrheumatic aortic valve disorder, unspecified: Secondary | ICD-10-CM

## 2018-11-04 DIAGNOSIS — Z7901 Long term (current) use of anticoagulants: Secondary | ICD-10-CM

## 2018-11-04 LAB — COMPREHENSIVE METABOLIC PANEL WITH GFR
ALT: 61 IU/L — ABNORMAL HIGH (ref 0–32)
AST: 52 IU/L — ABNORMAL HIGH (ref 0–40)
Albumin/Globulin Ratio: 2.1 (ref 1.2–2.2)
Albumin: 4.4 g/dL (ref 3.7–4.7)
Alkaline Phosphatase: 103 IU/L (ref 39–117)
BUN/Creatinine Ratio: 15 (ref 12–28)
BUN: 12 mg/dL (ref 8–27)
Bilirubin Total: 0.6 mg/dL (ref 0.0–1.2)
CO2: 25 mmol/L (ref 20–29)
Calcium: 9.5 mg/dL (ref 8.7–10.3)
Chloride: 103 mmol/L (ref 96–106)
Creatinine, Ser: 0.8 mg/dL (ref 0.57–1.00)
GFR calc Af Amer: 81 mL/min/1.73 (ref >59)
GFR calc non Af Amer: 70 mL/min/1.73 (ref >59)
Globulin, Total: 2.1 g/dL (ref 1.5–4.5)
Glucose: 93 mg/dL (ref 65–99)
Potassium: 4.7 mmol/L (ref 3.5–5.2)
Sodium: 142 mmol/L (ref 134–144)
Total Protein: 6.5 g/dL (ref 6.0–8.5)

## 2018-11-04 LAB — LIPID PANEL
Chol/HDL Ratio: 3 ratio (ref 0.0–4.4)
Cholesterol, Total: 181 mg/dL (ref 100–199)
HDL: 61 mg/dL (ref >39)
LDL Calculated: 103 mg/dL — ABNORMAL HIGH (ref 0–99)
Triglycerides: 83 mg/dL (ref 0–149)
VLDL Cholesterol Cal: 17 mg/dL (ref 5–40)

## 2018-11-04 LAB — CBC WITH DIFFERENTIAL/PLATELET
Basophils Absolute: 0.1 10*3/uL (ref 0.0–0.2)
Basos: 1 %
EOS (ABSOLUTE): 0.2 10*3/uL (ref 0.0–0.4)
Eos: 4 %
Hematocrit: 39.9 % (ref 34.0–46.6)
Hemoglobin: 13 g/dL (ref 11.1–15.9)
Immature Grans (Abs): 0 10*3/uL (ref 0.0–0.1)
Immature Granulocytes: 0 %
Lymphocytes Absolute: 2.3 10*3/uL (ref 0.7–3.1)
Lymphs: 41 %
MCH: 27.9 pg (ref 26.6–33.0)
MCHC: 32.6 g/dL (ref 31.5–35.7)
MCV: 86 fL (ref 79–97)
Monocytes Absolute: 0.7 10*3/uL (ref 0.1–0.9)
Monocytes: 13 %
Neutrophils Absolute: 2.2 10*3/uL (ref 1.4–7.0)
Neutrophils: 41 %
Platelets: 218 10*3/uL (ref 150–450)
RBC: 4.66 x10E6/uL (ref 3.77–5.28)
RDW: 13.8 % (ref 11.7–15.4)
WBC: 5.4 10*3/uL (ref 3.4–10.8)

## 2018-11-04 LAB — POCT INR: INR: 2.7 (ref 2.0–3.0)

## 2018-11-04 NOTE — Addendum Note (Signed)
Addended by: Kathyrn Lass on: 11/04/2018 10:02 AM   Modules accepted: Orders

## 2018-11-04 NOTE — Patient Instructions (Signed)
Medication Instructions:  Continue all medications  If you need a refill on your cardiac medications before your next appointment, please call your pharmacy.   Lab work: INR Cmet,cbc,lipid panel today  If you have labs (blood work) drawn today and your tests are completely normal, you will receive your results only by: Marland Kitchen MyChart Message (if you have MyChart) OR . A paper copy in the mail If you have any lab test that is abnormal or we need to change your treatment, we will call you to review the results.  Testing/Procedures: None ordered  Follow-Up: At [facility], you and your health needs are our priority.  As part of our continuing mission to provide you with exceptional heart care, we have created designated Provider Care Teams.  These Care Teams include your primary Cardiologist (physician) and Advanced Practice Providers (APPs -  Physician Assistants and Nurse Practitioners) who all work together to provide you with the care you need, when you need it. Schedule follow up appointment in 6 months Call in Nov to schedule Feb appointment

## 2018-11-12 ENCOUNTER — Ambulatory Visit: Payer: Medicare Other | Admitting: Cardiology

## 2018-11-25 ENCOUNTER — Other Ambulatory Visit: Payer: Self-pay | Admitting: Cardiology

## 2018-12-18 ENCOUNTER — Ambulatory Visit (INDEPENDENT_AMBULATORY_CARE_PROVIDER_SITE_OTHER): Payer: Medicare Other | Admitting: Pharmacist

## 2018-12-18 ENCOUNTER — Other Ambulatory Visit: Payer: Self-pay

## 2018-12-18 DIAGNOSIS — I359 Nonrheumatic aortic valve disorder, unspecified: Secondary | ICD-10-CM | POA: Diagnosis not present

## 2018-12-18 DIAGNOSIS — Z7901 Long term (current) use of anticoagulants: Secondary | ICD-10-CM | POA: Diagnosis not present

## 2018-12-18 LAB — POCT INR: INR: 2.7 (ref 2.0–3.0)

## 2018-12-21 ENCOUNTER — Ambulatory Visit: Payer: Self-pay | Admitting: Psychiatry

## 2019-01-12 ENCOUNTER — Ambulatory Visit (INDEPENDENT_AMBULATORY_CARE_PROVIDER_SITE_OTHER): Payer: Medicare Other | Admitting: Psychiatry

## 2019-01-12 ENCOUNTER — Other Ambulatory Visit: Payer: Self-pay

## 2019-01-12 ENCOUNTER — Encounter: Payer: Self-pay | Admitting: Psychiatry

## 2019-01-12 DIAGNOSIS — F4001 Agoraphobia with panic disorder: Secondary | ICD-10-CM

## 2019-01-12 DIAGNOSIS — F338 Other recurrent depressive disorders: Secondary | ICD-10-CM | POA: Diagnosis not present

## 2019-01-12 MED ORDER — ESCITALOPRAM OXALATE 20 MG PO TABS: 20.0000 mg | ORAL_TABLET | Freq: Every day | ORAL | 3 refills | Status: DC

## 2019-01-12 NOTE — Progress Notes (Signed)
Tamara Patterson [id number] 1940-04-11 78 y.o.   Virtual Visit via Telephone Note  I connected with pt by telephone and verified that I am speaking with the correct person using two identifiers.   I discussed the limitations, risks, security and privacy concerns of performing an evaluation and management service by telephone and the availability of in person appointments. I also discussed with the patient that there may be a patient responsible charge related to this service. The patient expressed understanding and agreed to proceed.  I discussed the assessment and treatment plan with the patient. The patient was provided an opportunity to ask questions and all were answered. The patient agreed with the plan and demonstrated an understanding of the instructions.   The patient was advised to call back or seek an in-person evaluation if the symptoms worsen or if the condition fails to improve as anticipated.  I provided 15 minutes of non-face-to-face time during this encounter. The call started at 2 and ended at 215. The patient was located at home and the provider was located office.   Subjective:   Patient ID:  Tamara Patterson is a 78 y.o. (DOB December 18, 1940) female.  Chief Complaint:  Chief Complaint  Patient presents with  . Follow-up    Medication Management  . Anxiety    Medication Management    HPI [name] presents to the office today for follow-up of panic disorder with agoraphobia, seasonal depression, and history of a fear of disease or phobia of disease.  Last seen March 2019.  No meds were changed.  She was can encouraged to use her light box in the winter for seasonal depression and to maintain adequate levels of vitamin D.  She's been healthy re: Covid.  Overall anxiety is fairly stable.  Not bothered being isolated, anxiety has been less.  Routine and less stress of going out.  Not nearly as busy and involved in outside things.  Needed that rest and calmness.  Virtual  services at church 2-3 times weekly.  Has friends and family contact by phone.  Patient reports stable mood and denies depressed or irritable moods.  Patient denies any recent difficulty with anxiety.  Patient denies difficulty with sleep initiation or maintenance. Denies appetite disturbance.  Patient reports that energy and motivation have been good.  Patient denies any difficulty with concentration.  Patient denies any suicidal ideation.  Past psych med trials include citalopram 40, Lexapro 20, light box, trazodone no response, mirtazapine Review of Systems:  Review of Systems  Neurological: Negative for tremors and weakness.    Medications: I have reviewed the patient's current medications.  Current Outpatient Medications  Medication Sig Dispense Refill  . amLODipine (NORVASC) 2.5 MG tablet Take 1 tablet by mouth once daily 30 tablet 6  . Calcium Carbonate (CALCIUM 600 PO) Take 1 tablet by mouth daily.    . Cholecalciferol (VITAMIN D3) 2000 units TABS Take 2,000 Units by mouth daily.    Marland Kitchen escitalopram (LEXAPRO) 20 MG tablet Take 1 tablet (20 mg total) by mouth daily. 90 tablet 3  . MAGNESIUM MALATE PO Take 2 capsules by mouth daily.    . NON FORMULARY Take 1 capsule by mouth daily. "Anxiety Free" B complex supplement    . Omega-3 Fatty Acids (FISH OIL) 1000 MG CAPS Take 2,000 mg by mouth daily.    Marland Kitchen OVER THE COUNTER MEDICATION 1 tablet by Per post-pyloric tube route daily. Neuriva Brain Plus    . POTASSIUM PO Take 2 capsules  by mouth daily as needed (supplement).    . rosuvastatin (CRESTOR) 5 MG tablet TAKE ONE TABLET BY MOUTH ONCE DAILY (Patient taking differently: Take 5 mg by mouth 2 (two) times a week. ) 90 tablet 3  . warfarin (COUMADIN) 5 MG tablet TAKE 1 to 1 and 1/2  TABLETS BY MOUTH ONCE DAILY AS  DIRECTED  BY  THE  COUMADIN  CLINIC (Patient taking differently: Take by mouth See admin instructions. Pt takes 1.5 tabs on Monday and Friday. She takes 1 tab the rest of the week) 135  tablet 1  . amoxicillin (AMOXIL) 500 MG capsule      No current facility-administered medications for this visit.     Medication Side Effects: None  Allergies:  Allergies  Allergen Reactions  . Codeine Nausea Only  . Lipitor [Atorvastatin Calcium] Other (See Comments)    Caused muscle aching  . Atenolol Other (See Comments)    cramps    Past Medical History:  Diagnosis Date  . Anticoagulant long-term use   . CAD (coronary artery disease)   . Depression    seasonal  . Dyslipidemia   . GERD (gastroesophageal reflux disease)   . Giant cell arteritis (HCC)    per aortotomy biopsy in 1991  . HTN (hypertension)   . Hx of CABG 1991   LIMA-LAD, SVG-RCA, SVG-1st and 2ndOM  . Panic anxiety syndrome   . S/P AVR (aortic valve replacement)    with root grafting; #60mm  St. Jude valve conduit; Last Echo 2007    Family History  Problem Relation Age of Onset  . Liver disease Mother   . Pancreatic cancer Father     Social History   Socioeconomic History  . Marital status: Married    Spouse name: Not on file  . Number of children: 0  . Years of education: Not on file  . Highest education level: Not on file  Occupational History  . Occupation: social work    Associate Professor: RETIRED    Comment: retired  Engineer, production  . Financial resource strain: Not on file  . Food insecurity    Worry: Not on file    Inability: Not on file  . Transportation needs    Medical: Not on file    Non-medical: Not on file  Tobacco Use  . Smoking status: Former Games developer  . Smokeless tobacco: Never Used  Substance and Sexual Activity  . Alcohol use: No  . Drug use: No  . Sexual activity: Not on file  Lifestyle  . Physical activity    Days per week: Not on file    Minutes per session: Not on file  . Stress: Not on file  Relationships  . Social Musician on phone: Not on file    Gets together: Not on file    Attends religious service: Not on file    Active member of club or  organization: Not on file    Attends meetings of clubs or organizations: Not on file    Relationship status: Not on file  . Intimate partner violence    Fear of current or ex partner: Not on file    Emotionally abused: Not on file    Physically abused: Not on file    Forced sexual activity: Not on file  Other Topics Concern  . Not on file  Social History Narrative  . Not on file    Past Medical History, Surgical history, Social history, and Family history were  reviewed and updated as appropriate.   Please see review of systems for further details on the patient's review from today.   Objective:   Physical Exam:  There were no vitals taken for this visit.  Physical Exam Neurological:     Mental Status: She is alert and oriented to person, place, and time.     Cranial Nerves: No dysarthria.  Psychiatric:        Attention and Perception: Attention normal.        Mood and Affect: Mood normal. Mood is not anxious or depressed.        Speech: Speech normal.        Behavior: Behavior is cooperative.        Thought Content: Thought content normal. Thought content is not paranoid or delusional. Thought content does not include homicidal or suicidal ideation. Thought content does not include homicidal or suicidal plan.        Cognition and Memory: Cognition and memory normal.        Judgment: Judgment normal.     Comments: Anxiety managed.     Lab Review:     Component Value Date/Time   NA 142 11/04/2018 1019   K 4.7 11/04/2018 1019   CL 103 11/04/2018 1019   CO2 25 11/04/2018 1019   GLUCOSE 93 11/04/2018 1019   GLUCOSE 111 (H) 10/10/2016 0828   BUN 12 11/04/2018 1019   CREATININE 0.80 11/04/2018 1019   CREATININE 0.82 05/02/2015 1020   CALCIUM 9.5 11/04/2018 1019   PROT 6.5 11/04/2018 1019   ALBUMIN 4.4 11/04/2018 1019   AST 52 (H) 11/04/2018 1019   ALT 61 (H) 11/04/2018 1019   ALKPHOS 103 11/04/2018 1019   BILITOT 0.6 11/04/2018 1019   GFRNONAA 70 11/04/2018 1019    GFRAA 81 11/04/2018 1019       Component Value Date/Time   WBC 5.4 11/04/2018 1019   WBC 5.5 10/10/2016 0828   RBC 4.66 11/04/2018 1019   RBC 5.23 (H) 10/10/2016 0828   HGB 13.0 11/04/2018 1019   HCT 39.9 11/04/2018 1019   PLT 218 11/04/2018 1019   MCV 86 11/04/2018 1019   MCH 27.9 11/04/2018 1019   MCH 26.4 10/10/2016 0828   MCHC 32.6 11/04/2018 1019   MCHC 32.4 10/10/2016 0828   RDW 13.8 11/04/2018 1019   LYMPHSABS 2.3 11/04/2018 1019   MONOABS 0.7 04/13/2013 1057   EOSABS 0.2 11/04/2018 1019   BASOSABS 0.1 11/04/2018 1019    No results found for: POCLITH, LITHIUM   No results found for: PHENYTOIN, PHENOBARB, VALPROATE, CBMZ   .res Assessment: Plan:    [name] was seen today for follow-up and anxiety.  Diagnoses and all orders for this visit:  Panic disorder with agoraphobia  Seasonal depression (HCC)     Disc SE in detail and SSRI withdrawal sx.  Doing well recommend no changes under the stress.  Start light therapy in Nov if worsening seasonal depression.  Worked well for her in the past.  Replace bulbs.   Discussed the limited lifetime usefulness of fluorescent bulbs in light box is used to treat seasonal affective disorder.  She has never replaced her light box bulbs  She agrees.  Continue Lexapro 20 mg daily.  FU 1 year.  Meredith Staggersarey Cottle, MD, DFAPA    Please see After Visit Summary for patient specific instructions.  Future Appointments  Date Time Provider Department Center  01/29/2019 10:15 AM [facility]    No orders of  the defined types were placed in this encounter.   -------------------------------

## 2019-01-29 ENCOUNTER — Other Ambulatory Visit: Payer: Self-pay

## 2019-01-29 ENCOUNTER — Ambulatory Visit (INDEPENDENT_AMBULATORY_CARE_PROVIDER_SITE_OTHER): Payer: Medicare Other | Admitting: Pharmacist

## 2019-01-29 DIAGNOSIS — I359 Nonrheumatic aortic valve disorder, unspecified: Secondary | ICD-10-CM | POA: Diagnosis not present

## 2019-01-29 DIAGNOSIS — Z7901 Long term (current) use of anticoagulants: Secondary | ICD-10-CM | POA: Diagnosis not present

## 2019-01-29 LAB — POCT INR: INR: 2.3 (ref 2.0–3.0)

## 2019-03-16 ENCOUNTER — Ambulatory Visit (INDEPENDENT_AMBULATORY_CARE_PROVIDER_SITE_OTHER): Payer: Medicare Other | Admitting: Pharmacist Clinician (PhC)/ Clinical Pharmacy Specialist

## 2019-03-16 ENCOUNTER — Other Ambulatory Visit: Payer: Self-pay

## 2019-03-16 DIAGNOSIS — I359 Nonrheumatic aortic valve disorder, unspecified: Secondary | ICD-10-CM | POA: Diagnosis not present

## 2019-03-16 DIAGNOSIS — Z7901 Long term (current) use of anticoagulants: Secondary | ICD-10-CM

## 2019-03-16 LAB — POCT INR: INR: 2 (ref 2.0–3.0)

## 2019-03-22 ENCOUNTER — Other Ambulatory Visit: Payer: Self-pay | Admitting: Cardiology

## 2019-04-15 ENCOUNTER — Other Ambulatory Visit: Payer: Self-pay

## 2019-04-15 DIAGNOSIS — Z20822 Contact with and (suspected) exposure to covid-19: Secondary | ICD-10-CM

## 2019-04-16 LAB — NOVEL CORONAVIRUS, NAA: SARS-CoV-2, NAA: NOT DETECTED

## 2019-04-22 ENCOUNTER — Other Ambulatory Visit: Payer: Self-pay

## 2019-04-22 ENCOUNTER — Encounter: Payer: Self-pay | Admitting: Physician Assistant

## 2019-04-22 ENCOUNTER — Ambulatory Visit (INDEPENDENT_AMBULATORY_CARE_PROVIDER_SITE_OTHER): Payer: Medicare Other | Admitting: Physician Assistant

## 2019-04-22 ENCOUNTER — Ambulatory Visit (INDEPENDENT_AMBULATORY_CARE_PROVIDER_SITE_OTHER): Payer: Medicare Other | Admitting: Pharmacist Clinician (PhC)/ Clinical Pharmacy Specialist

## 2019-04-22 VITALS — BP 133/75 | HR 71 | Temp 96.7°F | Ht 66.0 in | Wt 156.0 lb

## 2019-04-22 DIAGNOSIS — I10 Essential (primary) hypertension: Secondary | ICD-10-CM

## 2019-04-22 DIAGNOSIS — Z952 Presence of prosthetic heart valve: Secondary | ICD-10-CM

## 2019-04-22 DIAGNOSIS — E785 Hyperlipidemia, unspecified: Secondary | ICD-10-CM

## 2019-04-22 DIAGNOSIS — I25810 Atherosclerosis of coronary artery bypass graft(s) without angina pectoris: Secondary | ICD-10-CM

## 2019-04-22 DIAGNOSIS — Z7901 Long term (current) use of anticoagulants: Secondary | ICD-10-CM

## 2019-04-22 DIAGNOSIS — I359 Nonrheumatic aortic valve disorder, unspecified: Secondary | ICD-10-CM

## 2019-04-22 LAB — POCT INR: INR: 1.9 — AB (ref 2.0–3.0)

## 2019-04-22 MED ORDER — ROSUVASTATIN CALCIUM 5 MG PO TABS: ORAL_TABLET | ORAL | 1 refills | Status: DC

## 2019-04-22 NOTE — Progress Notes (Signed)
Cardiology Office Note:    Date:  04/24/2019   ID:  Tamara Patterson, DOB 1940/04/13, MRN [id number]  PCP:  Tamara Prose, MD  Cardiologist:  Tamara Martinique, MD  Electrophysiologist:  None   Referring MD: Tamara Prose, MD   Chief Complaint  Patient presents with  . Follow-up    seen for Dr. Martinique.    History of Present Illness:    Tamara Patterson is a 79 y.o. female with a hx of CAD s/p CABG x 4 (LIMA-LAD, SVG-RCA, and SVG-OM1 and OM 2), hyperlipidemia, GERD, giant cell arteritis, hypertension, thoracic aortic aneurysm and history of mechanical AVR.  She underwent open heart surgery 1991 with mechanical aortic valve replacement and the grafting of the aortic root with a #23 mm Saint Jude valve conduit.  Aortic biopsy at the time of surgery indicated giant cell arteritis.  She was treated with steroid therapy and it was later weaned off. Myoview in January 2018 was normal.  She is intolerant of Zetia due to myalgia and also intolerant of WelChol due to hypoglycemia.   Her last follow-up with Dr. Martinique was in August 2020 at which time she was doing well.  Patient presents today for cardiology office visit.  He denies any chest pain or shortness of breath.  She has no lower extremity edema, orthopnea or PND.  We will check her Coumadin level today.  She is watching her diet to help control the cholesterol better.  Last lipid panel obtained in August 2020 demonstrated elevated LDL when compared to the 2019 level.  Dr. Martinique recommended diet and exercise and continue on the current medication.  She has a history of intolerance to multiple statins.  I opted to continue her on the current Crestor as well.  She will need a fasting lipid panel, CMP, CBC prior to her next visit.   Past Medical History:  Diagnosis Date  . Anticoagulant long-term use   . CAD (coronary artery disease)   . Depression    seasonal  . Dyslipidemia   . GERD (gastroesophageal reflux disease)   . Giant cell arteritis ([place])     per aortotomy biopsy in 1991  . HTN (hypertension)   . Hx of CABG 1991   LIMA-LAD, SVG-RCA, SVG-1st and 2ndOM  . Panic anxiety syndrome   . S/P AVR (aortic valve replacement)    with root grafting; #67mm  St. Jude valve conduit; Last Echo 2007    Past Surgical History:  Procedure Laterality Date  . AORTIC VALVE REPLACEMENT     with root grafting  . CARDIAC SURGERY    . CHOLECYSTECTOMY    . CORONARY ARTERY BYPASS GRAFT      Current Medications: Current Meds  Medication Sig  . amLODipine (NORVASC) 2.5 MG tablet Take 1 tablet by mouth once daily  . amoxicillin (AMOXIL) 500 MG capsule   . Calcium Carbonate (CALCIUM 600 PO) Take 1 tablet by mouth daily.  . Cholecalciferol (VITAMIN D3) 2000 units TABS Take 2,000 Units by mouth daily.  Marland Kitchen escitalopram (LEXAPRO) 20 MG tablet Take 1 tablet (20 mg total) by mouth daily.  Marland Kitchen MAGNESIUM MALATE PO Take 2 capsules by mouth daily.  . NON FORMULARY Take 1 capsule by mouth daily. "Anxiety Free" B complex supplement  . Omega-3 Fatty Acids (FISH OIL) 1000 MG CAPS Take 2,000 mg by mouth daily.  Marland Kitchen OVER THE COUNTER MEDICATION 1 tablet by Per post-pyloric tube route daily. Neuriva Brain Plus  . POTASSIUM PO Take 2 capsules  by mouth daily as needed (supplement).  . rosuvastatin (CRESTOR) 5 MG tablet Take 1 tablet twice a week  . warfarin (COUMADIN) 5 MG tablet TAKE 1 & 1/2 (ONE & ONE-HALF) TABLETS BY MOUTH ONCE DAILY AS DIRECTED BY COUMADIN CLINIC  . [DISCONTINUED] rosuvastatin (CRESTOR) 5 MG tablet TAKE ONE TABLET BY MOUTH ONCE DAILY (Patient taking differently: Take 5 mg by mouth 2 (two) times a week. )     Allergies:   Codeine, Lipitor [atorvastatin calcium], and Atenolol   Social History   Socioeconomic History  . Marital status: Married    Spouse name: Not on file  . Number of children: 0  . Years of education: Not on file  . Highest education level: Not on file  Occupational History  . Occupation: social work    Associate Professor: RETIRED     Comment: retired  Tobacco Use  . Smoking status: Former Games developer  . Smokeless tobacco: Never Used  Substance and Sexual Activity  . Alcohol use: No  . Drug use: No  . Sexual activity: Not on file  Other Topics Concern  . Not on file  Social History Narrative  . Not on file   Social Determinants of Health   Financial Resource Strain:   . Difficulty of Paying Living Expenses: Not on file  Food Insecurity:   . Worried About Programme researcher, broadcasting/film/video in the Last Year: Not on file  . Ran Out of Food in the Last Year: Not on file  Transportation Needs:   . Lack of Transportation (Medical): Not on file  . Lack of Transportation (Non-Medical): Not on file  Physical Activity:   . Days of Exercise per Week: Not on file  . Minutes of Exercise per Session: Not on file  Stress:   . Feeling of Stress : Not on file  Social Connections:   . Frequency of Communication with Friends and Family: Not on file  . Frequency of Social Gatherings with Friends and Family: Not on file  . Attends Religious Services: Not on file  . Active Member of Clubs or Organizations: Not on file  . Attends Banker Meetings: Not on file  . Marital Status: Not on file     Family History: The patient's family history includes Liver disease in her mother; Pancreatic cancer in her father.  ROS:   Please see the history of present illness.     All other systems reviewed and are negative.  EKGs/Labs/Other Studies Reviewed:    The following studies were reviewed today:  Myoview 03/20/2016  The left ventricular ejection fraction is normal (55-65%).  Nuclear stress EF: 64%.  There was no ST segment deviation noted during stress.  Blood pressure demonstrated a normal response to exercise.   Normal study, no evidence for ischemia or infarction.    EKG:  EKG is not ordered today.    Recent Labs: 11/04/2018: ALT 61; BUN 12; Creatinine, Ser 0.80; Hemoglobin 13.0; Platelets 218; Potassium 4.7; Sodium 142    Recent Lipid Panel    Component Value Date/Time   CHOL 181 11/04/2018 1019   TRIG 83 11/04/2018 1019   HDL 61 11/04/2018 1019   CHOLHDL 3.0 11/04/2018 1019   CHOLHDL 4.6 05/02/2015 1020   VLDL 37 (H) 05/02/2015 1020   LDLCALC 103 (H) 11/04/2018 1019   LDLDIRECT 126.4 10/07/2012 1209    Physical Exam:    VS:  BP 133/75   Pulse 71   Temp (!) 96.7 F (35.9 C)  Ht 5\' 6"  (1.676 m)   Wt 156 lb (70.8 kg)   SpO2 96%   BMI 25.18 kg/m     Wt Readings from Last 3 Encounters:  04/22/19 156 lb (70.8 kg)  11/04/18 158 lb 6.4 oz (71.8 kg)  12/17/17 153 lb (69.4 kg)     GEN:  Well nourished, well developed in no acute distress HEENT: Normal NECK: No JVD; No carotid bruits LYMPHATICS: No lymphadenopathy CARDIAC: RRR, no murmurs, rubs, gallops RESPIRATORY:  Clear to auscultation without rales, wheezing or rhonchi  ABDOMEN: Soft, non-tender, non-distended MUSCULOSKELETAL:  No edema; No deformity  SKIN: Warm and dry NEUROLOGIC:  Alert and oriented x 3 PSYCHIATRIC:  Normal affect   ASSESSMENT:    1. Coronary artery disease involving coronary bypass graft of native heart without angina pectoris   2. Dyslipidemia   3. Essential hypertension   4. H/O mechanical aortic valve replacement   5. Chronic anticoagulation    PLAN:    In order of problems listed above:  1. CAD s/p CABG: Not on aspirin given the need for Coumadin.  Denies any recent chest pain.  2. History of mechanical aortic valve replacement: We will check Coumadin level today  3. Hypertension: Blood pressure stable  4. Hyperlipidemia: Intolerant of multiple statins.  Continue on the current dose of Crestor.   Medication Adjustments/Labs and Tests Ordered: Current medicines are reviewed at length with the patient today.  Concerns regarding medicines are outlined above.  Orders Placed This Encounter  Procedures  . Comprehensive Metabolic Panel (CMET)  . CBC with Differential  . Lipid panel  . EKG 12-Lead    Meds ordered this encounter  Medications  . rosuvastatin (CRESTOR) 5 MG tablet    Sig: Take 1 tablet twice a week    Dispense:  90 tablet    Refill:  1    Patient Instructions  Medication Instructions:  Your physician recommends that you continue on your current medications as directed. Please refer to the Current Medication list given to you today. *If you need a refill on your cardiac medications before your next appointment, please call your pharmacy*  Lab Work: Your physician recommends that you return for lab work in: 6 months 1 week before your next appt-CBC, BMET, LIPID If you have labs (blood work) drawn today and your tests are completely normal, you will receive your results only by: 02/16/18 MyChart Message (if you have MyChart) OR . A paper copy in the mail If you have any lab test that is abnormal or we need to change your treatment, we will call you to review the results.  Testing/Procedures: NONE  Follow-Up: At [facility], you and your health needs are our priority.  As part of our continuing mission to provide you with exceptional heart care, we have created designated Provider Care Teams.  These Care Teams include your primary Cardiologist (physician) and Advanced Practice Providers (APPs -  Physician Assistants and Nurse Practitioners) who all work together to provide you with the care you need, when you need it.  Your next appointment:   6 month(s)  The format for your next appointment:   In Person  Provider:   Peter [facility], MD  Other Instructions     Signed, [place], PA  04/24/2019 11:00 PM    [facility]

## 2019-04-22 NOTE — Patient Instructions (Signed)
Medication Instructions:  Your physician recommends that you continue on your current medications as directed. Please refer to the Current Medication list given to you today. *If you need a refill on your cardiac medications before your next appointment, please call your pharmacy*  Lab Work: Your physician recommends that you return for lab work in: 6 months 1 week before your next appt-CBC, BMET, LIPID If you have labs (blood work) drawn today and your tests are completely normal, you will receive your results only by: Marland Kitchen MyChart Message (if you have MyChart) OR . A paper copy in the mail If you have any lab test that is abnormal or we need to change your treatment, we will call you to review the results.  Testing/Procedures: NONE  Follow-Up: At [facility], you and your health needs are our priority.  As part of our continuing mission to provide you with exceptional heart care, we have created designated Provider Care Teams.  These Care Teams include your primary Cardiologist (physician) and Advanced Practice Providers (APPs -  Physician Assistants and Nurse Practitioners) who all work together to provide you with the care you need, when you need it.  Your next appointment:   6 month(s)  The format for your next appointment:   In Person  Provider:   Peter [place], MD  Other Instructions

## 2019-04-24 ENCOUNTER — Encounter: Payer: Self-pay | Admitting: Physician Assistant

## 2019-05-17 ENCOUNTER — Other Ambulatory Visit: Payer: Self-pay

## 2019-05-17 ENCOUNTER — Ambulatory Visit (INDEPENDENT_AMBULATORY_CARE_PROVIDER_SITE_OTHER): Payer: Medicare Other | Admitting: Pharmacist

## 2019-05-17 DIAGNOSIS — Z7901 Long term (current) use of anticoagulants: Secondary | ICD-10-CM | POA: Diagnosis not present

## 2019-05-17 DIAGNOSIS — I359 Nonrheumatic aortic valve disorder, unspecified: Secondary | ICD-10-CM | POA: Diagnosis not present

## 2019-05-17 LAB — POCT INR: INR: 2 (ref 2.0–3.0)

## 2019-06-28 ENCOUNTER — Ambulatory Visit (INDEPENDENT_AMBULATORY_CARE_PROVIDER_SITE_OTHER): Payer: Medicare Other | Admitting: Pharmacist

## 2019-06-28 ENCOUNTER — Other Ambulatory Visit: Payer: Self-pay

## 2019-06-28 DIAGNOSIS — I359 Nonrheumatic aortic valve disorder, unspecified: Secondary | ICD-10-CM | POA: Diagnosis not present

## 2019-06-28 DIAGNOSIS — Z7901 Long term (current) use of anticoagulants: Secondary | ICD-10-CM | POA: Diagnosis not present

## 2019-06-28 LAB — POCT INR: INR: 2 (ref 2.0–3.0)

## 2019-07-01 ENCOUNTER — Other Ambulatory Visit: Payer: Self-pay | Admitting: Cardiology

## 2019-07-08 ENCOUNTER — Other Ambulatory Visit: Payer: Self-pay | Admitting: Cardiology

## 2019-07-26 ENCOUNTER — Other Ambulatory Visit: Payer: Self-pay

## 2019-07-26 ENCOUNTER — Ambulatory Visit (INDEPENDENT_AMBULATORY_CARE_PROVIDER_SITE_OTHER): Payer: Medicare Other | Admitting: Pharmacist Clinician (PhC)/ Clinical Pharmacy Specialist

## 2019-07-26 DIAGNOSIS — I359 Nonrheumatic aortic valve disorder, unspecified: Secondary | ICD-10-CM

## 2019-07-26 DIAGNOSIS — Z7901 Long term (current) use of anticoagulants: Secondary | ICD-10-CM | POA: Diagnosis not present

## 2019-07-26 LAB — POCT INR: INR: 3.1 — AB (ref 2.0–3.0)

## 2019-08-23 ENCOUNTER — Ambulatory Visit (INDEPENDENT_AMBULATORY_CARE_PROVIDER_SITE_OTHER): Payer: Medicare Other | Admitting: Pharmacist Clinician (PhC)/ Clinical Pharmacy Specialist

## 2019-08-23 ENCOUNTER — Other Ambulatory Visit: Payer: Self-pay

## 2019-08-23 DIAGNOSIS — Z7901 Long term (current) use of anticoagulants: Secondary | ICD-10-CM | POA: Diagnosis not present

## 2019-08-23 DIAGNOSIS — I359 Nonrheumatic aortic valve disorder, unspecified: Secondary | ICD-10-CM

## 2019-08-23 LAB — POCT INR: INR: 1.7 — AB (ref 2.0–3.0)

## 2019-08-23 NOTE — Patient Instructions (Signed)
Take 2 tablets today Monday June 7, then continue with 1 tables daily except 1.5 tablet each Monday, Wednesday and Friday. Repeat INR in 4 wks

## 2019-09-22 ENCOUNTER — Ambulatory Visit (INDEPENDENT_AMBULATORY_CARE_PROVIDER_SITE_OTHER): Payer: Medicare Other

## 2019-09-22 ENCOUNTER — Other Ambulatory Visit: Payer: Self-pay

## 2019-09-22 DIAGNOSIS — I359 Nonrheumatic aortic valve disorder, unspecified: Secondary | ICD-10-CM

## 2019-09-22 DIAGNOSIS — Z7901 Long term (current) use of anticoagulants: Secondary | ICD-10-CM

## 2019-09-22 DIAGNOSIS — Z5181 Encounter for therapeutic drug level monitoring: Secondary | ICD-10-CM | POA: Diagnosis not present

## 2019-09-22 LAB — POCT INR: INR: 1.9 — AB (ref 2.0–3.0)

## 2019-09-22 NOTE — Patient Instructions (Signed)
Take 2 tablets today, then continue with 1 tables daily except 1.5 tablet each Monday, Wednesday and Friday. Repeat INR in 3 wks

## 2019-10-20 ENCOUNTER — Other Ambulatory Visit: Payer: Self-pay

## 2019-10-20 ENCOUNTER — Ambulatory Visit (INDEPENDENT_AMBULATORY_CARE_PROVIDER_SITE_OTHER): Payer: Medicare Other

## 2019-10-20 DIAGNOSIS — Z5181 Encounter for therapeutic drug level monitoring: Secondary | ICD-10-CM

## 2019-10-20 DIAGNOSIS — I359 Nonrheumatic aortic valve disorder, unspecified: Secondary | ICD-10-CM | POA: Diagnosis not present

## 2019-10-20 DIAGNOSIS — Z7901 Long term (current) use of anticoagulants: Secondary | ICD-10-CM | POA: Diagnosis not present

## 2019-10-20 LAB — POCT INR: INR: 2.6 (ref 2.0–3.0)

## 2019-10-20 NOTE — Patient Instructions (Signed)
continue with 1 tables daily except 1.5 tablet each Monday, Wednesday and Friday. Repeat INR in 4 wks

## 2019-11-04 ENCOUNTER — Other Ambulatory Visit: Payer: Self-pay | Admitting: Cardiology

## 2019-11-13 NOTE — Progress Notes (Signed)
Tamara Patterson Date of Birth: [birth year]   History of Present Illness: Tamara Patterson is seen today for followup. 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 This included mechanical aortic valve replacement and grafting of the aortic root with a #23 mm St. Jude valve conduit. She also had coronary bypass surgery including an IMA graft to the LAD, saphenous vein graft to the right coronary, and saphenous vein graft to the first and second obtuse marginal vessels. 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 She was treated with pulse steroids for a period of time and was weaned off of this. Last Myoview study was in January 2018 and was normal. Echo in 2011 was satisfactory.  She has been intolerant of statins and Zetia due to myalgias. Also intolerant of Welchol due to hypoglycemia.  On follow up today she is doing well. Notes her watch reports a resting HR 52-58. Energy level is good.  No chest pain, dyspnea, palpitations, dizziness.  No edema. No bleeding.  Current Outpatient Medications on File Prior to Visit  Medication Sig Dispense Refill  . amLODipine (NORVASC) 2.5 MG tablet Take 1 tablet by mouth once daily 90 tablet 2  . amoxicillin (AMOXIL) 500 MG capsule     . Barberry-Oreg Grape-Goldenseal (BERBERINE COMPLEX PO) Take 2 tablets by mouth daily.    . Calcium Carbonate (CALCIUM 600 PO) Take 1 tablet by mouth daily.    . Cholecalciferol (VITAMIN D3) 2000 units TABS Take 2,000 Units by mouth daily.    Marland Kitchen escitalopram (LEXAPRO) 20 MG tablet Take 1 tablet (20 mg total) by mouth daily. 90 tablet 3  . MAGNESIUM MALATE PO Take 2 capsules by mouth daily.    Marland Kitchen neomycin-polymyxin b-dexamethasone (MAXITROL) 3.5-10000-0.1 OINT SMARTSIG:Right Eye Every Evening    . neomycin-polymyxin b-dexamethasone (MAXITROL) 3.5-10000-0.1 SUSP Place into the right eye.    . NON FORMULARY Take 1 capsule by mouth daily. "Anxiety Free" B  complex supplement    . Omega-3 Fatty Acids (FISH OIL) 1000 MG CAPS Take 2,000 mg by mouth daily.    Marland Kitchen OVER THE COUNTER MEDICATION 1 tablet by Per post-pyloric tube route daily. Neuriva Brain Plus    . POTASSIUM PO Take 2 capsules by mouth daily as needed (supplement).    . rosuvastatin (CRESTOR) 5 MG tablet Take 1 tablet twice a week 90 tablet 1  . warfarin (COUMADIN) 5 MG tablet TAKE 1 & 1/2 (ONE & ONE-HALF) TABLETS BY MOUTH ONCE DAILY AS DIRECTED BY COUMADIN CLINIC 135 tablet 0  . XIIDRA 5 % SOLN INSTILL 1 DROP INTO EACH EYE TWICE DAILY     No current facility-administered medications on file prior to visit.    Allergies  Allergen Reactions  . Codeine Nausea Only  . Lipitor [Atorvastatin Calcium] Other (See Comments)    Caused muscle aching  . Atenolol Other (See Comments)    cramps    Past Medical History:  Diagnosis Date  . Anticoagulant long-term use   . CAD (coronary artery disease)   . Depression    seasonal  . Dyslipidemia   . GERD (gastroesophageal reflux disease)   . Giant cell arteritis ([facility])    per aortotomy biopsy in 1991  . HTN (hypertension)   . Hx of CABG 1991   LIMA-LAD, SVG-RCA, SVG-1st and 2ndOM  . Panic anxiety syndrome   . S/P AVR (aortic valve replacement)    with root grafting; #45mm  St.  Jude valve conduit; Last Echo 2007    Past Surgical History:  Procedure Laterality Date  . AORTIC VALVE REPLACEMENT     with root grafting  . CARDIAC SURGERY    . CHOLECYSTECTOMY    . CORONARY ARTERY BYPASS GRAFT      Social History   Tobacco Use  Smoking Status Former Smoker  Smokeless Tobacco Never Used    Social History   Substance and Sexual Activity  Alcohol Use No    Family History  Problem Relation Age of Onset  . Liver disease Mother   . Pancreatic cancer Father     Review of Systems: As noted in history of present illness. All other systems were reviewed and are negative.  Physical Exam: BP 138/64   Pulse 62   Ht 5\' 6"  (1.676 m)    Wt 151 lb (68.5 kg)   SpO2 98%   BMI 24.37 kg/m  GENERAL:  Well appearing BF in NAD HEENT:  PERRL, EOMI, sclera are clear. Oropharynx is clear. NECK:  No jugular venous distention, carotid upstroke brisk and symmetric, no bruits, no thyromegaly or adenopathy LUNGS:  Clear to auscultation bilaterally CHEST:  Unremarkable HEART:  RRR,  PMI not displaced or sustained,good mechanical AV click, no S3, no S4: no clicks, no rubs, no murmurs ABD:  Soft, nontender. BS +, no masses or bruits. No hepatomegaly, no splenomegaly EXT:  2 + pulses throughout, no edema, no cyanosis no clubbing SKIN:  Warm and dry.  No rashes NEURO:  Alert and oriented x 3. Cranial nerves II through XII intact. PSYCH:  Cognitively intact    LABORATORY DATA:  Lab Results  Component Value Date   WBC 5.4 11/04/2018   HGB 13.0 11/04/2018   HCT 39.9 11/04/2018   PLT 218 11/04/2018   GLUCOSE 93 11/04/2018   CHOL 181 11/04/2018   TRIG 83 11/04/2018   HDL 61 11/04/2018   LDLDIRECT 126.4 10/07/2012   LDLCALC 103 (H) 11/04/2018   ALT 61 (H) 11/04/2018   AST 52 (H) 11/04/2018   NA 142 11/04/2018   K 4.7 11/04/2018   CL 103 11/04/2018   CREATININE 0.80 11/04/2018   BUN 12 11/04/2018   CO2 25 11/04/2018   INR 2.6 10/20/2019   Labs from primary care dated 05/02/15: normal CMET Dated 05/25/15: A1c5.8% Dated 11/10/15: cholesterol 177, triglycerides 91, LDL 110, HDL 49. Dated 05/17/19: glucose 114. A1c 5.6%. BMET otherwise normal.   Myoview 03/20/16: Study Highlights     The left ventricular ejection fraction is normal (55-65%).  Nuclear stress EF: 64%.  There was no ST segment deviation noted during stress.  Blood pressure demonstrated a normal response to exercise.   Normal study, no evidence for ischemia or infarction     Assessment / Plan: 1. Status post mechanical aortic valve replacement.  Exam is stable. INR following in our coumadin clinic. Will check today. Routine SBE prophylaxis.   2. Status  post CABG for ostial coronary disease. No angina. Myoview 03/20/16 was normal. Continue current medical therapy. She is asymptomatic.   3. Hyperlipidemia.   On very low dose Crestor. Intolerant of multiple meds. Will update labs today.  4. Right bundle branch block/LAFB- chronic. She is asymptomatic.  5. HTN  well controlled.   6. Sinus brady noted by Apple watch. On no rate slowing meds. She is asymptomatic so will observe.   I will follow up in 6 months

## 2019-11-17 ENCOUNTER — Ambulatory Visit (INDEPENDENT_AMBULATORY_CARE_PROVIDER_SITE_OTHER): Payer: Medicare Other

## 2019-11-17 ENCOUNTER — Encounter: Payer: Self-pay | Admitting: Cardiology

## 2019-11-17 ENCOUNTER — Ambulatory Visit (INDEPENDENT_AMBULATORY_CARE_PROVIDER_SITE_OTHER): Payer: Medicare Other | Admitting: Cardiology

## 2019-11-17 ENCOUNTER — Other Ambulatory Visit: Payer: Self-pay

## 2019-11-17 VITALS — BP 138/64 | HR 62 | Ht 66.0 in | Wt 151.0 lb

## 2019-11-17 DIAGNOSIS — I25810 Atherosclerosis of coronary artery bypass graft(s) without angina pectoris: Secondary | ICD-10-CM

## 2019-11-17 DIAGNOSIS — I359 Nonrheumatic aortic valve disorder, unspecified: Secondary | ICD-10-CM

## 2019-11-17 DIAGNOSIS — Z5181 Encounter for therapeutic drug level monitoring: Secondary | ICD-10-CM

## 2019-11-17 DIAGNOSIS — Z951 Presence of aortocoronary bypass graft: Secondary | ICD-10-CM

## 2019-11-17 DIAGNOSIS — Z7901 Long term (current) use of anticoagulants: Secondary | ICD-10-CM | POA: Diagnosis not present

## 2019-11-17 DIAGNOSIS — Z952 Presence of prosthetic heart valve: Secondary | ICD-10-CM

## 2019-11-17 DIAGNOSIS — R739 Hyperglycemia, unspecified: Secondary | ICD-10-CM

## 2019-11-17 DIAGNOSIS — I10 Essential (primary) hypertension: Secondary | ICD-10-CM

## 2019-11-17 LAB — POCT INR: INR: 3.1 — AB (ref 2.0–3.0)

## 2019-11-17 NOTE — Patient Instructions (Signed)
Take 1 tablet today and then continue with 1 tablet daily except 1.5 tablet each Monday, Wednesday and Friday. Repeat INR in 4 wks

## 2019-11-17 NOTE — Addendum Note (Signed)
Addended by: Neoma Laming on: 11/17/2019 08:58 AM   Modules accepted: Orders

## 2019-11-18 LAB — COMPREHENSIVE METABOLIC PANEL
ALT: 35 IU/L — ABNORMAL HIGH (ref 0–32)
AST: 36 IU/L (ref 0–40)
Albumin/Globulin Ratio: 1.8 (ref 1.2–2.2)
Albumin: 4.5 g/dL (ref 3.7–4.7)
Alkaline Phosphatase: 106 IU/L (ref 48–121)
BUN/Creatinine Ratio: 23 (ref 12–28)
BUN: 19 mg/dL (ref 8–27)
Bilirubin Total: 0.5 mg/dL (ref 0.0–1.2)
CO2: 23 mmol/L (ref 20–29)
Calcium: 9.6 mg/dL (ref 8.7–10.3)
Chloride: 105 mmol/L (ref 96–106)
Creatinine, Ser: 0.83 mg/dL (ref 0.57–1.00)
GFR calc Af Amer: 77 mL/min/{1.73_m2} (ref >59)
GFR calc non Af Amer: 67 mL/min/{1.73_m2} (ref >59)
Globulin, Total: 2.5 g/dL (ref 1.5–4.5)
Glucose: 102 mg/dL — ABNORMAL HIGH (ref 65–99)
Potassium: 4.9 mmol/L (ref 3.5–5.2)
Sodium: 143 mmol/L (ref 134–144)
Total Protein: 7 g/dL (ref 6.0–8.5)

## 2019-11-18 LAB — CBC WITH DIFFERENTIAL/PLATELET
Basophils Absolute: 0.1 10*3/uL (ref 0.0–0.2)
Basos: 1 %
EOS (ABSOLUTE): 0.2 10*3/uL (ref 0.0–0.4)
Eos: 4 %
Hematocrit: 39.5 % (ref 34.0–46.6)
Hemoglobin: 13.1 g/dL (ref 11.1–15.9)
Immature Grans (Abs): 0 10*3/uL (ref 0.0–0.1)
Immature Granulocytes: 0 %
Lymphocytes Absolute: 2.4 10*3/uL (ref 0.7–3.1)
Lymphs: 43 %
MCH: 27.5 pg (ref 26.6–33.0)
MCHC: 33.2 g/dL (ref 31.5–35.7)
MCV: 83 fL (ref 79–97)
Monocytes Absolute: 0.6 10*3/uL (ref 0.1–0.9)
Monocytes: 11 %
Neutrophils Absolute: 2.3 10*3/uL (ref 1.4–7.0)
Neutrophils: 41 %
Platelets: 238 10*3/uL (ref 150–450)
RBC: 4.76 x10E6/uL (ref 3.77–5.28)
RDW: 13.4 % (ref 11.7–15.4)
WBC: 5.6 10*3/uL (ref 3.4–10.8)

## 2019-11-18 LAB — HEMOGLOBIN A1C
Est. average glucose Bld gHb Est-mCnc: 114 mg/dL
Hgb A1c MFr Bld: 5.6 % (ref 4.8–5.6)

## 2019-11-18 LAB — LIPID PANEL
Chol/HDL Ratio: 2.8 ratio (ref 0.0–4.4)
Cholesterol, Total: 156 mg/dL (ref 100–199)
HDL: 55 mg/dL (ref >39)
LDL Chol Calc (NIH): 86 mg/dL (ref 0–99)
Triglycerides: 81 mg/dL (ref 0–149)
VLDL Cholesterol Cal: 15 mg/dL (ref 5–40)

## 2019-12-15 ENCOUNTER — Ambulatory Visit (INDEPENDENT_AMBULATORY_CARE_PROVIDER_SITE_OTHER): Payer: Medicare Other

## 2019-12-15 ENCOUNTER — Other Ambulatory Visit: Payer: Self-pay

## 2019-12-15 DIAGNOSIS — Z5181 Encounter for therapeutic drug level monitoring: Secondary | ICD-10-CM

## 2019-12-15 DIAGNOSIS — I359 Nonrheumatic aortic valve disorder, unspecified: Secondary | ICD-10-CM

## 2019-12-15 DIAGNOSIS — Z7901 Long term (current) use of anticoagulants: Secondary | ICD-10-CM | POA: Diagnosis not present

## 2019-12-15 LAB — POCT INR: INR: 3.4 — AB (ref 2.0–3.0)

## 2019-12-15 NOTE — Patient Instructions (Signed)
Hold today and then continue with 1 tablet daily except 1.5 tablet each Monday, Wednesday and Friday. Repeat INR in 3 wks

## 2020-01-05 ENCOUNTER — Other Ambulatory Visit: Payer: Self-pay

## 2020-01-05 ENCOUNTER — Ambulatory Visit (INDEPENDENT_AMBULATORY_CARE_PROVIDER_SITE_OTHER): Payer: Medicare Other | Admitting: Pharmacist

## 2020-01-05 DIAGNOSIS — Z952 Presence of prosthetic heart valve: Secondary | ICD-10-CM | POA: Diagnosis not present

## 2020-01-05 DIAGNOSIS — Z7901 Long term (current) use of anticoagulants: Secondary | ICD-10-CM

## 2020-01-05 LAB — POCT INR: INR: 2.3 (ref 2.0–3.0)

## 2020-01-13 ENCOUNTER — Ambulatory Visit: Payer: Medicare Other | Admitting: Psychiatry

## 2020-02-02 ENCOUNTER — Ambulatory Visit (INDEPENDENT_AMBULATORY_CARE_PROVIDER_SITE_OTHER): Payer: Medicare Other

## 2020-02-02 ENCOUNTER — Other Ambulatory Visit: Payer: Self-pay

## 2020-02-02 DIAGNOSIS — Z7901 Long term (current) use of anticoagulants: Secondary | ICD-10-CM

## 2020-02-02 DIAGNOSIS — Z5181 Encounter for therapeutic drug level monitoring: Secondary | ICD-10-CM

## 2020-02-02 DIAGNOSIS — I359 Nonrheumatic aortic valve disorder, unspecified: Secondary | ICD-10-CM

## 2020-02-02 LAB — POCT INR: INR: 4 — AB (ref 2.0–3.0)

## 2020-02-02 NOTE — Patient Instructions (Signed)
Hold today only and then Continue with 1 tablet daily except 1.5 tablet each Monday, Wednesday and Friday. Repeat INR in 4 wks

## 2020-02-18 ENCOUNTER — Other Ambulatory Visit: Payer: Self-pay | Admitting: Cardiology

## 2020-03-03 ENCOUNTER — Other Ambulatory Visit: Payer: Self-pay

## 2020-03-03 ENCOUNTER — Ambulatory Visit (INDEPENDENT_AMBULATORY_CARE_PROVIDER_SITE_OTHER): Payer: Medicare Other

## 2020-03-03 DIAGNOSIS — Z7901 Long term (current) use of anticoagulants: Secondary | ICD-10-CM

## 2020-03-03 DIAGNOSIS — Z5181 Encounter for therapeutic drug level monitoring: Secondary | ICD-10-CM

## 2020-03-03 DIAGNOSIS — I359 Nonrheumatic aortic valve disorder, unspecified: Secondary | ICD-10-CM | POA: Diagnosis not present

## 2020-03-03 LAB — POCT INR: INR: 2.5 (ref 2.0–3.0)

## 2020-03-03 NOTE — Patient Instructions (Signed)
Continue with 1 tablet daily except 1.5 tablet each Monday, Wednesday and Friday. Repeat INR in 6 wks  

## 2020-03-21 ENCOUNTER — Encounter: Payer: Self-pay | Admitting: Psychiatry

## 2020-03-21 ENCOUNTER — Telehealth (INDEPENDENT_AMBULATORY_CARE_PROVIDER_SITE_OTHER): Payer: Medicare Other | Admitting: Psychiatry

## 2020-03-21 DIAGNOSIS — F338 Other recurrent depressive disorders: Secondary | ICD-10-CM | POA: Diagnosis not present

## 2020-03-21 DIAGNOSIS — F4001 Agoraphobia with panic disorder: Secondary | ICD-10-CM

## 2020-03-21 MED ORDER — ESCITALOPRAM OXALATE 20 MG PO TABS: 20.0000 mg | ORAL_TABLET | Freq: Every day | ORAL | 0 refills | Status: DC

## 2020-03-21 NOTE — Progress Notes (Signed)
Tamara Patterson [id number] February 22, 1941 80 y.o.   Video Visit via My Chart  I connected with pt by My Chart and verified that I am speaking with the correct person using two identifiers.   I discussed the limitations, risks, security and privacy concerns of performing an evaluation and management service by My Chart  and the availability of in person appointments. I also discussed with the patient that there may be a patient responsible charge related to this service. The patient expressed understanding and agreed to proceed.  I discussed the assessment and treatment plan with the patient. The patient was provided an opportunity to ask questions and all were answered. The patient agreed with the plan and demonstrated an understanding of the instructions.   The patient was advised to call back or seek an in-person evaluation if the symptoms worsen or if the condition fails to improve as anticipated.  I provided 30 minutes of video time during this encounter.  The patient was located at home and the provider was located office. Session started 1130 and ended 12 noon.  Subjective:   Patient ID:  Tamara Patterson is a 80 y.o. (DOB Sep 18, 1940) female.  Chief Complaint:  Chief Complaint  Patient presents with  . Follow-up  . Anxiety    HPI Tamara Patterson presents to the office today for follow-up of panic disorder with agoraphobia, seasonal depression, and history of a fear of disease or phobia of disease.  seen March 2019 & 02/2019.  No meds were changed.  She was can encouraged to use her light box in the winter for seasonal depression and to maintain adequate levels of vitamin D.  03/21/2020 appt with following noted: Remains on Lexapro 20 mg daily and no other psych meds. Done very well with mood and anxiety.  Still isolated.  No panic. Little tics or tremors in mouth.  Sort of like a jerk.  Not really bothering her.  Had for a couple of years and comes and goes and read about it.  Patient  reports stable mood and denies depressed or irritable moods.  Patient denies any recent difficulty with anxiety.  Patient denies difficulty with sleep initiation or maintenance. Denies appetite disturbance.  Patient reports that energy and motivation have been good.  Patient denies any difficulty with concentration.  Patient denies any suicidal ideation.  Past psych med trials include citalopram 40, Lexapro 20, light box, trazodone no response, mirtazapine  Review of Systems:  Review of Systems  Neurological: Positive for tremors. Negative for weakness.    Medications: I have reviewed the patient's current medications.  Current Outpatient Medications  Medication Sig Dispense Refill  . amLODipine (NORVASC) 2.5 MG tablet Take 1 tablet by mouth once daily 90 tablet 2  . amoxicillin (AMOXIL) 500 MG capsule     . Barberry-Oreg Grape-Goldenseal (BERBERINE COMPLEX PO) Take 2 tablets by mouth daily.    . Calcium Carbonate (CALCIUM 600 PO) Take 1 tablet by mouth daily.    . Cholecalciferol (VITAMIN D3) 2000 units TABS Take 2,000 Units by mouth daily.    Marland Kitchen MAGNESIUM MALATE PO Take 2 capsules by mouth daily.    Marland Kitchen neomycin-polymyxin b-dexamethasone (MAXITROL) 3.5-10000-0.1 OINT SMARTSIG:Right Eye Every Evening    . neomycin-polymyxin b-dexamethasone (MAXITROL) 3.5-10000-0.1 SUSP Place into the right eye.    . NON FORMULARY Take 1 capsule by mouth daily. "Anxiety Free" B complex supplement    . Omega-3 Fatty Acids (FISH OIL) 1000 MG CAPS Take 2,000 mg by mouth daily.    Marland Kitchen  OVER THE COUNTER MEDICATION 1 tablet by Per post-pyloric tube route daily. Neuriva Brain Plus    . POTASSIUM PO Take 2 capsules by mouth daily as needed (supplement).    . rosuvastatin (CRESTOR) 5 MG tablet Take 1 tablet twice a week 90 tablet 1  . warfarin (COUMADIN) 5 MG tablet TAKE 1 & 1/2 (ONE & ONE-HALF) TABLETS BY MOUTH ONCE DAILY AS  DIRECTED  BY  COUMADIN  CLINIC 135 tablet 0  . XIIDRA 5 % SOLN INSTILL 1 DROP INTO EACH EYE  TWICE DAILY    . escitalopram (LEXAPRO) 20 MG tablet Take 1 tablet (20 mg total) by mouth daily. 90 tablet 0   No current facility-administered medications for this visit.    Medication Side Effects: None  Allergies:  Allergies  Allergen Reactions  . Codeine Nausea Only  . Lipitor [Atorvastatin Calcium] Other (See Comments)    Caused muscle aching  . Atenolol Other (See Comments)    cramps    Past Medical History:  Diagnosis Date  . Anticoagulant long-term use   . CAD (coronary artery disease)   . Depression    seasonal  . Dyslipidemia   . GERD (gastroesophageal reflux disease)   . Giant cell arteritis (HCC)    per aortotomy biopsy in 1991  . HTN (hypertension)   . Hx of CABG 1991   LIMA-LAD, SVG-RCA, SVG-1st and 2ndOM  . Panic anxiety syndrome   . S/P AVR (aortic valve replacement)    with root grafting; #70mm  St. Jude valve conduit; Last Echo 2007    Family History  Problem Relation Age of Onset  . Liver disease Mother   . Pancreatic cancer Father     Social History   Socioeconomic History  . Marital status: Married    Spouse name: Not on file  . Number of children: 0  . Years of education: Not on file  . Highest education level: Not on file  Occupational History  . Occupation: social work    Associate Professor: RETIRED    Comment: retired  Tobacco Use  . Smoking status: Former Games developer  . Smokeless tobacco: Never Used  Vaping Use  . Vaping Use: Never used  Substance and Sexual Activity  . Alcohol use: No  . Drug use: No  . Sexual activity: Not on file  Other Topics Concern  . Not on file  Social History Narrative  . Not on file   Social Determinants of Health   Financial Resource Strain: Not on file  Food Insecurity: Not on file  Transportation Needs: Not on file  Physical Activity: Not on file  Stress: Not on file  Social Connections: Not on file  Intimate Partner Violence: Not on file    Past Medical History, Surgical history, Social history,  and Family history were reviewed and updated as appropriate.   Please see review of systems for further details on the patient's review from today.   Objective:   Physical Exam:  There were no vitals taken for this visit.  Physical Exam Neurological:     Mental Status: She is alert and oriented to person, place, and time.     Cranial Nerves: No dysarthria.  Psychiatric:        Attention and Perception: Attention normal.        Mood and Affect: Mood normal. Mood is not anxious or depressed.        Speech: Speech normal.        Behavior: Behavior is cooperative.  Thought Content: Thought content normal. Thought content is not paranoid or delusional. Thought content does not include homicidal or suicidal ideation. Thought content does not include homicidal or suicidal plan.        Cognition and Memory: Cognition and memory normal.        Judgment: Judgment normal.     Comments: Anxiety managed.     Lab Review:     Component Value Date/Time   NA 143 11/17/2019 0907   K 4.9 11/17/2019 0907   CL 105 11/17/2019 0907   CO2 23 11/17/2019 0907   GLUCOSE 102 (H) 11/17/2019 0907   GLUCOSE 111 (H) 10/10/2016 0828   BUN 19 11/17/2019 0907   CREATININE 0.83 11/17/2019 0907   CREATININE 0.82 05/02/2015 1020   CALCIUM 9.6 11/17/2019 0907   PROT 7.0 11/17/2019 0907   ALBUMIN 4.5 11/17/2019 0907   AST 36 11/17/2019 0907   ALT 35 (H) 11/17/2019 0907   ALKPHOS 106 11/17/2019 0907   BILITOT 0.5 11/17/2019 0907   GFRNONAA 67 11/17/2019 0907   GFRAA 77 11/17/2019 0907       Component Value Date/Time   WBC 5.6 11/17/2019 0907   WBC 5.5 10/10/2016 0828   RBC 4.76 11/17/2019 0907   RBC 5.23 (H) 10/10/2016 0828   HGB 13.1 11/17/2019 0907   HCT 39.5 11/17/2019 0907   PLT 238 11/17/2019 0907   MCV 83 11/17/2019 0907   MCH 27.5 11/17/2019 0907   MCH 26.4 10/10/2016 0828   MCHC 33.2 11/17/2019 0907   MCHC 32.4 10/10/2016 0828   RDW 13.4 11/17/2019 0907   LYMPHSABS 2.4 11/17/2019  0907   MONOABS 0.7 04/13/2013 1057   EOSABS 0.2 11/17/2019 0907   BASOSABS 0.1 11/17/2019 0907    No results found for: POCLITH, LITHIUM   No results found for: PHENYTOIN, PHENOBARB, VALPROATE, CBMZ   .res Assessment: Plan:    Tamara Patterson was seen today for follow-up and anxiety.  Diagnoses and all orders for this visit:  Panic disorder with agoraphobia -     escitalopram (LEXAPRO) 20 MG tablet; Take 1 tablet (20 mg total) by mouth daily.  Seasonal depression (HCC) -     escitalopram (LEXAPRO) 20 MG tablet; Take 1 tablet (20 mg total) by mouth daily.     Disc SE in detail and SSRI withdrawal sx.  Start light therapy in Nov if worsening seasonal depression.  Worked well for her in the past.  Replace bulbs.   Discussed the limited lifetime usefulness of fluorescent bulbs in light box is used to treat seasonal affective disorder.  She has never replaced her light box bulbs She has not needed to use lightbox in last couple of years.  Continue Lexapro 20 mg daily vs reduce the dosage bc tics and tremors.  Rec not reduce until Spring.   April reduce to 15 mg for a couple of months to see if this is better.  Disc risk relapse.  They may not be related.  Rec she discussed this with her primary care doc when she sees the doc soon.  It is not typical for SSRIs to cause tremors or tics but it is not impossible. Patient has never been on an atypical so it is unlikely that she has tardive dyskinesia unless she has had significant experience with nausea medicines.  She agrees.   FU 6 mos  Meredith Staggers, MD, DFAPA    Please see After Visit Summary for patient specific instructions.  Future Appointments  Date Time Provider  [place]  04/14/2020  8:45 AM [facility]  05/17/2020  1:20 PM [place], Peter M, MD [facility]    No orders of the defined types were placed in this encounter.   -------------------------------

## 2020-04-05 ENCOUNTER — Other Ambulatory Visit: Payer: Self-pay | Admitting: Cardiology

## 2020-04-17 ENCOUNTER — Other Ambulatory Visit: Payer: Self-pay

## 2020-04-17 ENCOUNTER — Ambulatory Visit (INDEPENDENT_AMBULATORY_CARE_PROVIDER_SITE_OTHER): Payer: Medicare Other

## 2020-04-17 DIAGNOSIS — Z7901 Long term (current) use of anticoagulants: Secondary | ICD-10-CM

## 2020-04-17 DIAGNOSIS — Z5181 Encounter for therapeutic drug level monitoring: Secondary | ICD-10-CM

## 2020-04-17 DIAGNOSIS — I359 Nonrheumatic aortic valve disorder, unspecified: Secondary | ICD-10-CM

## 2020-04-17 LAB — POCT INR: INR: 2.2 (ref 2.0–3.0)

## 2020-04-17 NOTE — Patient Instructions (Signed)
Continue with 1 tablet daily except 1.5 tablet each Monday, Wednesday and Friday. Repeat INR in 4 wks

## 2020-05-14 NOTE — Progress Notes (Signed)
Tamara Patterson Date of Birth: [birth year]   History of Present Illness: Tamara Patterson is seen today for followup. 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 This included mechanical aortic valve replacement and grafting of the aortic root with a #23 mm St. Jude valve conduit. She also had coronary bypass surgery including an IMA graft to the LAD, saphenous vein graft to the right coronary, and saphenous vein graft to the first and second obtuse marginal vessels. 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 She was treated with pulse steroids for a period of time and was weaned off of this. Last Myoview study was in January 2018 and was normal. Echo in 2011 was satisfactory.  She has been intolerant of statins and Zetia due to myalgias. Also intolerant of Welchol due to hypoglycemia.  On follow up today she is doing well. Notes her watch reports a resting HR 50-59. Energy level is good.  No chest pain, dyspnea, palpitations, dizziness.  No edema. No bleeding. BP at home has been 120 systolic.   Current Outpatient Medications on File Prior to Visit  Medication Sig Dispense Refill  . amLODipine (NORVASC) 2.5 MG tablet Take 1 tablet by mouth once daily 90 tablet 0  . amoxicillin (AMOXIL) 500 MG capsule     . Barberry-Oreg Grape-Goldenseal (BERBERINE COMPLEX PO) Take 2 tablets by mouth daily.    . Calcium Carbonate (CALCIUM 600 PO) Take 1 tablet by mouth daily.    . Cholecalciferol (VITAMIN D3) 2000 units TABS Take 2,000 Units by mouth daily.    Marland Kitchen escitalopram (LEXAPRO) 20 MG tablet Take 1 tablet (20 mg total) by mouth daily. 90 tablet 0  . neomycin-polymyxin b-dexamethasone (MAXITROL) 3.5-10000-0.1 OINT SMARTSIG:Right Eye Every Evening    . neomycin-polymyxin b-dexamethasone (MAXITROL) 3.5-10000-0.1 SUSP Place into the right eye.    . NON FORMULARY Take 1 capsule by mouth daily. "Anxiety Free" B complex supplement    .  Omega-3 Fatty Acids (FISH OIL) 1000 MG CAPS Take 2,000 mg by mouth daily.    Marland Kitchen OVER THE COUNTER MEDICATION 1 tablet by Per post-pyloric tube route daily. Neuriva Brain Plus    . rosuvastatin (CRESTOR) 5 MG tablet Take 1 tablet twice a week 90 tablet 1  . warfarin (COUMADIN) 5 MG tablet TAKE 1 & 1/2 (ONE & ONE-HALF) TABLETS BY MOUTH ONCE DAILY AS  DIRECTED  BY  COUMADIN  CLINIC 135 tablet 0  . XIIDRA 5 % SOLN INSTILL 1 DROP INTO EACH EYE TWICE DAILY     No current facility-administered medications on file prior to visit.    Allergies  Allergen Reactions  . Codeine Nausea Only  . Lipitor [Atorvastatin Calcium] Other (See Comments)    Caused muscle aching  . Atenolol Other (See Comments)    cramps    Past Medical History:  Diagnosis Date  . Anticoagulant long-term use   . CAD (coronary artery disease)   . Depression    seasonal  . Dyslipidemia   . GERD (gastroesophageal reflux disease)   . Giant cell arteritis (HCC)    per aortotomy biopsy in 1991  . HTN (hypertension)   . Hx of CABG 1991   LIMA-LAD, SVG-RCA, SVG-1st and 2ndOM  . Panic anxiety syndrome   . S/P AVR (aortic valve replacement)    with root grafting; #28mm  St. Jude valve conduit; Last Echo 2007    Past Surgical History:  Procedure Laterality Date  .  AORTIC VALVE REPLACEMENT     with root grafting  . CARDIAC SURGERY    . CHOLECYSTECTOMY    . CORONARY ARTERY BYPASS GRAFT      Social History   Tobacco Use  Smoking Status Former Smoker  Smokeless Tobacco Never Used    Social History   Substance and Sexual Activity  Alcohol Use No    Family History  Problem Relation Age of Onset  . Liver disease Mother   . Pancreatic cancer Father     Review of Systems: As noted in history of present illness. All other systems were reviewed and are negative.  Physical Exam: BP 140/70   Pulse 61   Ht 5' 5.5" (1.664 m)   Wt 158 lb 9.6 oz (71.9 kg)   SpO2 98%   BMI 25.99 kg/m  GENERAL:  Well appearing BF  in NAD HEENT:  PERRL, EOMI, sclera are clear. Oropharynx is clear. NECK:  No jugular venous distention, carotid upstroke brisk and symmetric, no bruits, no thyromegaly or adenopathy LUNGS:  Clear to auscultation bilaterally CHEST:  Unremarkable HEART:  RRR,  PMI not displaced or sustained,good mechanical AV click, no S3, no S4: no clicks, no rubs, no murmurs ABD:  Soft, nontender. BS +, no masses or bruits. No hepatomegaly, no splenomegaly EXT:  2 + pulses throughout, no edema, no cyanosis no clubbing SKIN:  Warm and dry.  No rashes NEURO:  Alert and oriented x 3. Cranial nerves II through XII intact. PSYCH:  Cognitively intact    LABORATORY DATA:  Lab Results  Component Value Date   WBC 5.6 11/17/2019   HGB 13.1 11/17/2019   HCT 39.5 11/17/2019   PLT 238 11/17/2019   GLUCOSE 102 (H) 11/17/2019   CHOL 156 11/17/2019   TRIG 81 11/17/2019   HDL 55 11/17/2019   LDLDIRECT 126.4 10/07/2012   LDLCALC 86 11/17/2019   ALT 35 (H) 11/17/2019   AST 36 11/17/2019   NA 143 11/17/2019   K 4.9 11/17/2019   CL 105 11/17/2019   CREATININE 0.83 11/17/2019   BUN 19 11/17/2019   CO2 23 11/17/2019   INR 2.3 05/17/2020   HGBA1C 5.6 11/17/2019   Labs from primary care dated 05/02/15: normal CMET Dated 05/25/15: A1c5.8% Dated 11/10/15: cholesterol 177, triglycerides 91, LDL 110, HDL 49. Dated 05/17/19: glucose 114. A1c 5.6%. BMET otherwise normal. Dated 03/14/20: normal BMET   Myoview 03/20/16: Study Highlights     The left ventricular ejection fraction is normal (55-65%).  Nuclear stress EF: 64%.  There was no ST segment deviation noted during stress.  Blood pressure demonstrated a normal response to exercise.   Normal study, no evidence for ischemia or infarction     Assessment / Plan: 1. Status post mechanical aortic valve replacement.  Exam is stable. INR following in our coumadin clinic. Was 2.3 today. Routine SBE prophylaxis.   2. Status post CABG for ostial coronary disease.  No angina. Myoview 03/20/16 was normal. Continue current medical therapy. She is asymptomatic.   3. Hyperlipidemia.   On very low dose Crestor. Intolerant of multiple meds. Last LDL 89. Will continue current therapy.  4. Right bundle branch block/LAFB- chronic. She is asymptomatic.  5. HTN  well controlled.   6. Sinus brady noted by Apple watch. On no rate slowing meds. She is asymptomatic so will observe.   I will follow up in 6 months

## 2020-05-17 ENCOUNTER — Ambulatory Visit (INDEPENDENT_AMBULATORY_CARE_PROVIDER_SITE_OTHER): Payer: Medicare Other

## 2020-05-17 ENCOUNTER — Other Ambulatory Visit: Payer: Self-pay

## 2020-05-17 ENCOUNTER — Ambulatory Visit (INDEPENDENT_AMBULATORY_CARE_PROVIDER_SITE_OTHER): Payer: Medicare Other | Admitting: Cardiology

## 2020-05-17 ENCOUNTER — Encounter: Payer: Self-pay | Admitting: Cardiology

## 2020-05-17 VITALS — BP 140/70 | HR 61 | Ht 65.5 in | Wt 158.6 lb

## 2020-05-17 DIAGNOSIS — I25810 Atherosclerosis of coronary artery bypass graft(s) without angina pectoris: Secondary | ICD-10-CM | POA: Diagnosis not present

## 2020-05-17 DIAGNOSIS — Z952 Presence of prosthetic heart valve: Secondary | ICD-10-CM

## 2020-05-17 DIAGNOSIS — E785 Hyperlipidemia, unspecified: Secondary | ICD-10-CM

## 2020-05-17 DIAGNOSIS — Z951 Presence of aortocoronary bypass graft: Secondary | ICD-10-CM

## 2020-05-17 DIAGNOSIS — Z7901 Long term (current) use of anticoagulants: Secondary | ICD-10-CM

## 2020-05-17 DIAGNOSIS — Z5181 Encounter for therapeutic drug level monitoring: Secondary | ICD-10-CM

## 2020-05-17 DIAGNOSIS — I359 Nonrheumatic aortic valve disorder, unspecified: Secondary | ICD-10-CM

## 2020-05-17 LAB — POCT INR: INR: 2.3 (ref 2.0–3.0)

## 2020-05-17 NOTE — Addendum Note (Signed)
Addended by: Neoma Laming on: 05/17/2020 01:54 PM   Modules accepted: Orders

## 2020-05-17 NOTE — Patient Instructions (Signed)
Continue with 1 tablet daily except 1.5 tablet each Monday, Wednesday and Friday. Repeat INR in 6 wks

## 2020-05-30 ENCOUNTER — Other Ambulatory Visit: Payer: Self-pay | Admitting: Cardiology

## 2020-06-13 ENCOUNTER — Other Ambulatory Visit: Payer: Self-pay | Admitting: Cardiology

## 2020-06-28 ENCOUNTER — Ambulatory Visit (INDEPENDENT_AMBULATORY_CARE_PROVIDER_SITE_OTHER): Payer: Medicare Other

## 2020-06-28 ENCOUNTER — Other Ambulatory Visit: Payer: Self-pay

## 2020-06-28 DIAGNOSIS — I359 Nonrheumatic aortic valve disorder, unspecified: Secondary | ICD-10-CM | POA: Diagnosis not present

## 2020-06-28 DIAGNOSIS — Z7901 Long term (current) use of anticoagulants: Secondary | ICD-10-CM

## 2020-06-28 DIAGNOSIS — Z5181 Encounter for therapeutic drug level monitoring: Secondary | ICD-10-CM | POA: Diagnosis not present

## 2020-06-28 LAB — POCT INR: INR: 3.7 — AB (ref 2.0–3.0)

## 2020-06-28 NOTE — Patient Instructions (Signed)
Hold tonight only and then Continue with 1 tablet daily except 1.5 tablet each Monday, Wednesday and Friday. Repeat INR in 4 wks.

## 2020-07-07 ENCOUNTER — Other Ambulatory Visit: Payer: Self-pay | Admitting: Cardiology

## 2020-07-13 ENCOUNTER — Other Ambulatory Visit: Payer: Self-pay | Admitting: Psychiatry

## 2020-07-13 DIAGNOSIS — F338 Other recurrent depressive disorders: Secondary | ICD-10-CM

## 2020-07-13 DIAGNOSIS — F4001 Agoraphobia with panic disorder: Secondary | ICD-10-CM

## 2020-07-26 ENCOUNTER — Ambulatory Visit (INDEPENDENT_AMBULATORY_CARE_PROVIDER_SITE_OTHER): Payer: Medicare Other

## 2020-07-26 ENCOUNTER — Other Ambulatory Visit: Payer: Self-pay

## 2020-07-26 DIAGNOSIS — I359 Nonrheumatic aortic valve disorder, unspecified: Secondary | ICD-10-CM

## 2020-07-26 DIAGNOSIS — Z7901 Long term (current) use of anticoagulants: Secondary | ICD-10-CM | POA: Diagnosis not present

## 2020-07-26 DIAGNOSIS — Z5181 Encounter for therapeutic drug level monitoring: Secondary | ICD-10-CM | POA: Diagnosis not present

## 2020-07-26 LAB — POCT INR: INR: 3.2 — AB (ref 2.0–3.0)

## 2020-07-26 NOTE — Patient Instructions (Signed)
Hold tonight only and then Continue with 1 tablet daily except 1.5 tablet each Monday, Wednesday and Friday. Repeat INR in 4 wks. Be more consistent with greens (3 days per week)

## 2020-08-28 ENCOUNTER — Ambulatory Visit (INDEPENDENT_AMBULATORY_CARE_PROVIDER_SITE_OTHER): Payer: Medicare Other

## 2020-08-28 ENCOUNTER — Other Ambulatory Visit: Payer: Self-pay

## 2020-08-28 DIAGNOSIS — Z7901 Long term (current) use of anticoagulants: Secondary | ICD-10-CM | POA: Diagnosis not present

## 2020-08-28 DIAGNOSIS — I359 Nonrheumatic aortic valve disorder, unspecified: Secondary | ICD-10-CM | POA: Diagnosis not present

## 2020-08-28 DIAGNOSIS — Z5181 Encounter for therapeutic drug level monitoring: Secondary | ICD-10-CM | POA: Diagnosis not present

## 2020-08-28 LAB — POCT INR: INR: 3.9 — AB (ref 2.0–3.0)

## 2020-08-28 NOTE — Patient Instructions (Signed)
Hold tonight only and then decrease to 1 tablet daily except 1.5 tablet each Wednesday. Repeat INR in 4 wks. Be more consistent with greens (3 days per week)

## 2020-09-05 ENCOUNTER — Other Ambulatory Visit: Payer: Self-pay | Admitting: Cardiology

## 2020-09-25 ENCOUNTER — Other Ambulatory Visit: Payer: Self-pay

## 2020-09-25 ENCOUNTER — Ambulatory Visit (INDEPENDENT_AMBULATORY_CARE_PROVIDER_SITE_OTHER): Payer: Medicare Other

## 2020-09-25 DIAGNOSIS — Z5181 Encounter for therapeutic drug level monitoring: Secondary | ICD-10-CM

## 2020-09-25 DIAGNOSIS — I359 Nonrheumatic aortic valve disorder, unspecified: Secondary | ICD-10-CM

## 2020-09-25 DIAGNOSIS — Z7901 Long term (current) use of anticoagulants: Secondary | ICD-10-CM

## 2020-09-25 LAB — POCT INR: INR: 2.2 (ref 2.0–3.0)

## 2020-09-25 NOTE — Patient Instructions (Signed)
1 tablet daily except 1.5 tablet each Wednesday. Repeat INR in 6 wks.

## 2020-10-06 ENCOUNTER — Other Ambulatory Visit: Payer: Self-pay | Admitting: Psychiatry

## 2020-10-06 ENCOUNTER — Other Ambulatory Visit: Payer: Self-pay | Admitting: Cardiology

## 2020-10-06 DIAGNOSIS — F338 Other recurrent depressive disorders: Secondary | ICD-10-CM

## 2020-10-06 DIAGNOSIS — F4001 Agoraphobia with panic disorder: Secondary | ICD-10-CM

## 2020-10-06 NOTE — Telephone Encounter (Signed)
Call to RS

## 2020-10-06 NOTE — Telephone Encounter (Signed)
Pt is scheduled for 10/6

## 2020-10-17 ENCOUNTER — Other Ambulatory Visit: Payer: Self-pay

## 2020-10-17 MED ORDER — AMLODIPINE BESYLATE 2.5 MG PO TABS: 2.5000 mg | ORAL_TABLET | Freq: Every day | ORAL | 1 refills | Status: DC

## 2020-10-20 ENCOUNTER — Other Ambulatory Visit: Payer: Self-pay

## 2020-10-20 MED ORDER — AMLODIPINE BESYLATE 2.5 MG PO TABS: 2.5000 mg | ORAL_TABLET | Freq: Every day | ORAL | 1 refills | Status: DC

## 2020-11-06 ENCOUNTER — Other Ambulatory Visit: Payer: Self-pay

## 2020-11-06 ENCOUNTER — Ambulatory Visit (INDEPENDENT_AMBULATORY_CARE_PROVIDER_SITE_OTHER): Payer: Medicare Other

## 2020-11-06 DIAGNOSIS — Z7901 Long term (current) use of anticoagulants: Secondary | ICD-10-CM | POA: Diagnosis not present

## 2020-11-06 DIAGNOSIS — I359 Nonrheumatic aortic valve disorder, unspecified: Secondary | ICD-10-CM | POA: Diagnosis not present

## 2020-11-06 LAB — POCT INR: INR: 2.6 (ref 2.0–3.0)

## 2020-11-06 NOTE — Patient Instructions (Signed)
Description   Continue taking 1 tablet daily except 1.5 tablet each Wednesday. Repeat INR in 6 wks.

## 2020-12-21 ENCOUNTER — Telehealth (INDEPENDENT_AMBULATORY_CARE_PROVIDER_SITE_OTHER): Payer: Medicare Other | Admitting: Psychiatry

## 2020-12-21 ENCOUNTER — Encounter: Payer: Self-pay | Admitting: Psychiatry

## 2020-12-21 DIAGNOSIS — F4001 Agoraphobia with panic disorder: Secondary | ICD-10-CM | POA: Diagnosis not present

## 2020-12-21 DIAGNOSIS — F338 Other recurrent depressive disorders: Secondary | ICD-10-CM

## 2020-12-21 MED ORDER — ESCITALOPRAM OXALATE 20 MG PO TABS: 20.0000 mg | ORAL_TABLET | Freq: Every day | ORAL | 2 refills | Status: DC

## 2020-12-21 NOTE — Addendum Note (Signed)
Addended by: Kirstie Peri on: 12/21/2020 09:27 AM   Modules accepted: Level of Service

## 2020-12-21 NOTE — Progress Notes (Addendum)
Tamara Patterson [id number] 1940-06-27 80 y.o.   Video Visit via My Chart  I connected with pt by My Chart and verified that I am speaking with the correct person using two identifiers.   I discussed the limitations, risks, security and privacy concerns of performing an evaluation and management service by My Chart  and the availability of in person appointments. I also discussed with the patient that there may be a patient responsible charge related to this service. The patient expressed understanding and agreed to proceed.  I discussed the assessment and treatment plan with the patient. The patient was provided an opportunity to ask questions and all were answered. The patient agreed with the plan and demonstrated an understanding of the instructions.   The patient was advised to call back or seek an in-person evaluation if the symptoms worsen or if the condition fails to improve as anticipated.  I provided 30 minutes of video time during this encounter.  The patient was located at home and the provider was located office. Session started 9 until 930  Subjective:   Patient ID:  Tamara Patterson is a 80 y.o. (DOB [birth year]) female.  Chief Complaint:  Chief Complaint  Patient presents with   Follow-up   Anxiety   Depression    HPI Tamara Patterson presents to the office today for follow-up of panic disorder with agoraphobia, seasonal depression, and history of a fear of disease or phobia of disease.  seen March 2019 & 02/2019.  No meds were changed.  She was can encouraged to use her light box in the winter for seasonal depression and to maintain adequate levels of vitamin D.  03/21/2020 appt with following noted: Remains on Lexapro 20 mg daily and no other psych meds. Done very well with mood and anxiety.  Still isolated.  No panic. Little tics or tremors in mouth.  Sort of like a jerk.  Not really bothering her.  Had for a couple of years and comes and goes and read about it. Plan: use  lightbox Continue Lexapro 20 mg daily vs reduce the dosage bc tics and tremors.  Rec not reduce until Spring.   April reduce to 15 mg for a couple of months to see if this is better.  Disc risk relapse.  They may not be related.  Rec she discussed this with her primary care doc when she sees the doc soon.  It is not typical for SSRIs to cause tremors or tics but it is not impossible.  12/21/2020 appt noted: Stayed on Lexapro 20 mg daily.  Tremor are not real bad but bothersome at times in hands. Moood good so far bbut seasonal downturn likely.  Then lightbox usually helps.  No panic or unusual anxiety. No SE Joined fitness center and doing yoga  Patient reports stable mood and denies depressed or irritable moods.  Patient denies any recent difficulty with anxiety.  Patient denies difficulty with sleep initiation or maintenance. Denies appetite disturbance.  Patient reports that energy and motivation have been good.  Patient denies any difficulty with concentration.  Patient denies any suicidal ideation.  Past psych med trials include citalopram 40, Lexapro 20, light box, trazodone no response, mirtazapine  Review of Systems:  Review of Systems  Musculoskeletal:  Positive for joint swelling.  Neurological:  Positive for tremors. Negative for weakness.   Medications: I have reviewed the patient's current medications.  Current Outpatient Medications  Medication Sig Dispense Refill   amoxicillin (AMOXIL) 500 MG  capsule      Barberry-Oreg Grape-Goldenseal (BERBERINE COMPLEX PO) Take 2 tablets by mouth daily.     Cholecalciferol (VITAMIN D3) 2000 units TABS Take 2,000 Units by mouth daily.     Omega-3 Fatty Acids (FISH OIL) 1000 MG CAPS Take 2,000 mg by mouth daily.     rosuvastatin (CRESTOR) 5 MG tablet Take 1 tablet by mouth once daily 90 tablet 2   warfarin (COUMADIN) 5 MG tablet TAKE 1 & 1/2 (ONE & ONE-HALF) TABLETS BY MOUTH ONCE DAILY AS  DIRECTED  BY  COUMADIN  CLINIC 135 tablet 0   XIIDRA  5 % SOLN INSTILL 1 DROP INTO EACH EYE TWICE DAILY     amLODipine (NORVASC) 2.5 MG tablet Take 1 tablet (2.5 mg total) by mouth daily for 3 days. 90 tablet 1   Calcium Carbonate (CALCIUM 600 PO) Take 1 tablet by mouth daily. (Patient not taking: Reported on 12/21/2020)     escitalopram (LEXAPRO) 20 MG tablet Take 1 tablet (20 mg total) by mouth daily. 90 tablet 2   NON FORMULARY Take 1 capsule by mouth daily. "Anxiety Free" B complex supplement (Patient not taking: Reported on 12/21/2020)     No current facility-administered medications for this visit.    Medication Side Effects: None  Allergies:  Allergies  Allergen Reactions   Codeine Nausea Only   Lipitor [Atorvastatin Calcium] Other (See Comments)    Caused muscle aching   Atenolol Other (See Comments)    cramps    Past Medical History:  Diagnosis Date   Anticoagulant long-term use    CAD (coronary artery disease)    Depression    seasonal   Dyslipidemia    GERD (gastroesophageal reflux disease)    Giant cell arteritis (HCC)    per aortotomy biopsy in 1991   HTN (hypertension)    Hx of CABG 1991   LIMA-LAD, SVG-RCA, SVG-1st and 2ndOM   Panic anxiety syndrome    S/P AVR (aortic valve replacement)    with root grafting; #60mm  St. Jude valve conduit; Last Echo 2007    Family History  Problem Relation Age of Onset   Liver disease Mother    Pancreatic cancer Father     Social History   Socioeconomic History   Marital status: Married    Spouse name: Not on file   Number of children: 0   Years of education: Not on file   Highest education level: Not on file  Occupational History   Occupation: social work    Associate Professor: RETIRED    Comment: retired  Tobacco Use   Smoking status: Former   Smokeless tobacco: Never  Building services engineer Use: Never used  Substance and Sexual Activity   Alcohol use: No   Drug use: No   Sexual activity: Not on file  Other Topics Concern   Not on file  Social History Narrative    Not on file   Social Determinants of Health   Financial Resource Strain: Not on file  Food Insecurity: Not on file  Transportation Needs: Not on file  Physical Activity: Not on file  Stress: Not on file  Social Connections: Not on file  Intimate Partner Violence: Not on file    Past Medical History, Surgical history, Social history, and Family history were reviewed and updated as appropriate.   Please see review of systems for further details on the patient's review from today.   Objective:   Physical Exam:  There were no vitals  taken for this visit.  Physical Exam Neurological:     Mental Status: She is alert and oriented to person, place, and time.     Cranial Nerves: No dysarthria.  Psychiatric:        Attention and Perception: Attention normal.        Mood and Affect: Mood normal. Mood is not anxious or depressed.        Speech: Speech normal.        Behavior: Behavior is cooperative.        Thought Content: Thought content normal. Thought content is not paranoid or delusional. Thought content does not include homicidal or suicidal ideation. Thought content does not include homicidal or suicidal plan.        Cognition and Memory: Cognition and memory normal.        Judgment: Judgment normal.     Comments: Anxiety managed.    Lab Review:     Component Value Date/Time   NA 143 11/17/2019 0907   K 4.9 11/17/2019 0907   CL 105 11/17/2019 0907   CO2 23 11/17/2019 0907   GLUCOSE 102 (H) 11/17/2019 0907   GLUCOSE 111 (H) 10/10/2016 0828   BUN 19 11/17/2019 0907   CREATININE 0.83 11/17/2019 0907   CREATININE 0.82 05/02/2015 1020   CALCIUM 9.6 11/17/2019 0907   PROT 7.0 11/17/2019 0907   ALBUMIN 4.5 11/17/2019 0907   AST 36 11/17/2019 0907   ALT 35 (H) 11/17/2019 0907   ALKPHOS 106 11/17/2019 0907   BILITOT 0.5 11/17/2019 0907   GFRNONAA 67 11/17/2019 0907   GFRAA 77 11/17/2019 0907       Component Value Date/Time   WBC 5.6 11/17/2019 0907   WBC 5.5  10/10/2016 0828   RBC 4.76 11/17/2019 0907   RBC 5.23 (H) 10/10/2016 0828   HGB 13.1 11/17/2019 0907   HCT 39.5 11/17/2019 0907   PLT 238 11/17/2019 0907   MCV 83 11/17/2019 0907   MCH 27.5 11/17/2019 0907   MCH 26.4 10/10/2016 0828   MCHC 33.2 11/17/2019 0907   MCHC 32.4 10/10/2016 0828   RDW 13.4 11/17/2019 0907   LYMPHSABS 2.4 11/17/2019 0907   MONOABS 0.7 04/13/2013 1057   EOSABS 0.2 11/17/2019 0907   BASOSABS 0.1 11/17/2019 0907    No results found for: POCLITH, LITHIUM   No results found for: PHENYTOIN, PHENOBARB, VALPROATE, CBMZ   .res Assessment: Plan:    Ranada was seen today for follow-up, anxiety and depression.  Diagnoses and all orders for this visit:  Seasonal depression (HCC) -     escitalopram (LEXAPRO) 20 MG tablet; Take 1 tablet (20 mg total) by mouth daily.  Panic disorder with agoraphobia -     escitalopram (LEXAPRO) 20 MG tablet; Take 1 tablet (20 mg total) by mouth daily.    Greater than 50% of 30 min face to face time with patient was spent on counseling and coordination of care. We discussed dxes and seasonality and timing of meds and relapse risk without and with meds. She's still doing well but expects seasonal downturn. Disc SE in detail and SSRI withdrawal sx.  Start light therapy in Nov if worsening seasonal depression.  Worked well for her in the past.  Replace bulbs.   Discussed the limited lifetime usefulness of fluorescent bulbs in light box is used to treat seasonal affective disorder.  She has never replaced her light box bulbs She has not needed to use lightbox in last couple of years.  Continue Lexapro  20 mg daily vs reduce the dosage bc tics and tremors.  Rec not reduce until Spring.   April reduce to 15 mg for a couple of months to see if this is better.  Disc risk relapse.  They may not be related.  Rec she discussed this with her primary care doc when she sees the doc soon.  It is not typical for SSRIs to cause tremors or tics but it  is not impossible. Patient has never been on an atypical so it is unlikely that she has tardive dyskinesia unless she has had significant experience with nausea medicines.  She agrees.   FU 6 mos  Meredith Staggers, MD, DFAPA    Please see After Visit Summary for patient specific instructions.  Future Appointments  Date Time Provider Department Center  12/25/2020  8:45 AM [facility]  01/03/2021  9:20 AM [place], Peter M, MD [facility]    No orders of the defined types were placed in this encounter.   -------------------------------

## 2020-12-25 ENCOUNTER — Ambulatory Visit (INDEPENDENT_AMBULATORY_CARE_PROVIDER_SITE_OTHER): Payer: Medicare Other

## 2020-12-25 ENCOUNTER — Other Ambulatory Visit: Payer: Self-pay

## 2020-12-25 DIAGNOSIS — Z5181 Encounter for therapeutic drug level monitoring: Secondary | ICD-10-CM

## 2020-12-25 DIAGNOSIS — Z7901 Long term (current) use of anticoagulants: Secondary | ICD-10-CM

## 2020-12-25 DIAGNOSIS — I359 Nonrheumatic aortic valve disorder, unspecified: Secondary | ICD-10-CM

## 2020-12-25 LAB — CBC WITH DIFFERENTIAL/PLATELET
Basophils Absolute: 0.1 10*3/uL (ref 0.0–0.2)
Basos: 1 %
EOS (ABSOLUTE): 0.2 10*3/uL (ref 0.0–0.4)
Eos: 3 %
Hematocrit: 40 % (ref 34.0–46.6)
Hemoglobin: 13.1 g/dL (ref 11.1–15.9)
Immature Grans (Abs): 0 10*3/uL (ref 0.0–0.1)
Immature Granulocytes: 0 %
Lymphocytes Absolute: 2.2 10*3/uL (ref 0.7–3.1)
Lymphs: 39 %
MCH: 27 pg (ref 26.6–33.0)
MCHC: 32.8 g/dL (ref 31.5–35.7)
MCV: 82 fL (ref 79–97)
Monocytes Absolute: 0.7 10*3/uL (ref 0.1–0.9)
Monocytes: 13 %
Neutrophils Absolute: 2.5 10*3/uL (ref 1.4–7.0)
Neutrophils: 44 %
Platelets: 232 10*3/uL (ref 150–450)
RBC: 4.86 x10E6/uL (ref 3.77–5.28)
RDW: 13.3 % (ref 11.7–15.4)
WBC: 5.6 10*3/uL (ref 3.4–10.8)

## 2020-12-25 LAB — LIPID PANEL
Chol/HDL Ratio: 3.3 ratio (ref 0.0–4.4)
Cholesterol, Total: 161 mg/dL (ref 100–199)
HDL: 49 mg/dL (ref >39)
LDL Chol Calc (NIH): 87 mg/dL (ref 0–99)
Triglycerides: 141 mg/dL (ref 0–149)
VLDL Cholesterol Cal: 25 mg/dL (ref 5–40)

## 2020-12-25 LAB — BASIC METABOLIC PANEL
BUN/Creatinine Ratio: 14 (ref 12–28)
BUN: 12 mg/dL (ref 8–27)
CO2: 24 mmol/L (ref 20–29)
Calcium: 9.2 mg/dL (ref 8.7–10.3)
Chloride: 107 mmol/L — ABNORMAL HIGH (ref 96–106)
Creatinine, Ser: 0.86 mg/dL (ref 0.57–1.00)
Glucose: 94 mg/dL (ref 70–99)
Potassium: 4.4 mmol/L (ref 3.5–5.2)
Sodium: 143 mmol/L (ref 134–144)
eGFR: 68 mL/min/{1.73_m2} (ref >59)

## 2020-12-25 LAB — HEPATIC FUNCTION PANEL
ALT: 15 IU/L (ref 0–32)
AST: 21 IU/L (ref 0–40)
Albumin: 4.4 g/dL (ref 3.6–4.6)
Alkaline Phosphatase: 115 IU/L (ref 44–121)
Bilirubin Total: 0.8 mg/dL (ref 0.0–1.2)
Bilirubin, Direct: 0.16 mg/dL (ref 0.00–0.40)
Total Protein: 6.4 g/dL (ref 6.0–8.5)

## 2020-12-25 LAB — POCT INR: INR: 3.6 — AB (ref 2.0–3.0)

## 2020-12-25 NOTE — Patient Instructions (Signed)
Hold tonight and then Continue taking 1 tablet daily except 1.5 tablet each Wednesday. Repeat INR in  9 days.

## 2020-12-28 ENCOUNTER — Telehealth: Payer: Self-pay | Admitting: *Deleted

## 2020-12-28 NOTE — Telephone Encounter (Signed)
   [facility] Pre-operative Risk Assessment    Patient Name: Tamara Patterson  DOB: 1941-01-09 MRN: [id number]  HEARTCARE STAFF:  - IMPORTANT!!!!!! Under Visit Info/Reason for Call, type in Other and utilize the format Clearance MM/DD/YY or Clearance TBD. Do not use dashes or single digits. - Please review there is not already an duplicate clearance open for this procedure. - If request is for dental extraction, please clarify the # of teeth to be extracted. - If the patient is currently at the dentist's office, call Pre-Op Callback Staff (MA/nurse) to input urgent request.  - If the patient is not currently in the dentist office, please route to the Pre-Op pool.  Request for surgical clearance:  What type of surgery is being performed? Excisional biopsy of the lesion located on the lingual of tooth #27    ( performed in office)   When is this surgery scheduled? TBD  What type of clearance is required (medical clearance vs. Pharmacy clearance to hold med vs. Both)? both  Are there any medications that need to be held prior to surgery and how long?  Coumadin    Practice name and name of physician performing surgery? [facility];  What is the office phone number? [phone]   7.   What is the office fax number? [phone]  8.   Anesthesia type (None, local, MAC, general) ? local   Raiford Simmonds 12/28/2020, 5:37 PM  _________________________________________________________________   (provider comments below)

## 2020-12-29 NOTE — Telephone Encounter (Signed)
Pt has appt 01/03/21 with cardiologist. Will add pre op clearance needed to appt notes. Will send notes to MD for upcoming appt. Will send FYI to requesting office pt has appt.

## 2020-12-29 NOTE — Telephone Encounter (Signed)
Primary Cardiologist:Peter [place], MD  Chart reviewed as part of pre-operative protocol coverage. Because of Tamara Patterson's past medical history and time since last visit, he/she will require a follow-up visit in order to better assess preoperative cardiovascular risk.  Pre-op covering staff: - Please schedule appointment and call patient to inform them. - Please contact requesting surgeon's office via preferred method (i.e, phone, fax) to inform them of need for appointment prior to surgery.  If applicable, this message will also be routed to pharmacy pool and/or primary cardiologist for input on holding anticoagulant/antiplatelet agent as requested below so that this information is available at time of patient's appointment.   Ronney Asters, NP  12/29/2020, 7:36 AM

## 2020-12-29 NOTE — Progress Notes (Signed)
Tamara Patterson Date of Birth: 09/21/1940   History of Present Illness: Tamara Patterson is seen today for followup. 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 This included mechanical aortic valve replacement and grafting of the aortic root with a #23 mm St. Jude valve conduit. She also had coronary bypass surgery including an IMA graft to the LAD, saphenous vein graft to the right coronary, and saphenous vein graft to the first and second obtuse marginal vessels. 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 She was treated with pulse steroids for a period of time and was weaned off of this. Last Myoview study was in January 2018 and was normal. Echo in 2011 was satisfactory.  She has been intolerant of statins and Zetia due to myalgias. Also intolerant of Welchol due to hypoglycemia.  On follow up today she is doing well. Energy level is good.  No chest pain, dyspnea, palpitations, dizziness.  No edema. No bleeding. BP at home has been well controlled. No chest pain or dyspnea. Needs clearance for an excisional biopsy next to a tooth.   Current Outpatient Medications on File Prior to Visit  Medication Sig Dispense Refill   amoxicillin (AMOXIL) 500 MG capsule      Barberry-Oreg Grape-Goldenseal (BERBERINE COMPLEX PO) Take 2 tablets by mouth daily.     Calcium Carbonate (CALCIUM 600 PO) Take 1 tablet by mouth daily.     Cholecalciferol (VITAMIN D3) 2000 units TABS Take 2,000 Units by mouth daily.     escitalopram (LEXAPRO) 20 MG tablet Take 1 tablet (20 mg total) by mouth daily. 90 tablet 2   NON FORMULARY Take 1 capsule by mouth daily. "Anxiety Free" B complex supplement     Omega-3 Fatty Acids (FISH OIL) 1000 MG CAPS Take 2,000 mg by mouth daily.     rosuvastatin (CRESTOR) 5 MG tablet Take 1 tablet by mouth once daily 90 tablet 2   warfarin (COUMADIN) 5 MG tablet TAKE 1 & 1/2 (ONE & ONE-HALF) TABLETS BY MOUTH ONCE  DAILY AS  DIRECTED  BY  COUMADIN  CLINIC 135 tablet 0   XIIDRA 5 % SOLN INSTILL 1 DROP INTO EACH EYE TWICE DAILY     amLODipine (NORVASC) 2.5 MG tablet Take 1 tablet (2.5 mg total) by mouth daily for 3 days. 90 tablet 1   No current facility-administered medications on file prior to visit.    Allergies  Allergen Reactions   Codeine Nausea Only   Lipitor [Atorvastatin Calcium] Other (See Comments)    Caused muscle aching   Atenolol Other (See Comments)    cramps    Past Medical History:  Diagnosis Date   Anticoagulant long-term use    CAD (coronary artery disease)    Depression    seasonal   Dyslipidemia    GERD (gastroesophageal reflux disease)    Giant cell arteritis (HCC)    per aortotomy biopsy in 1991   HTN (hypertension)    Hx of CABG 1991   LIMA-LAD, SVG-RCA, SVG-1st and 2ndOM   Panic anxiety syndrome    S/P AVR (aortic valve replacement)    with root grafting; #75mm  St. Jude valve conduit; Last Echo 2007    Past Surgical History:  Procedure Laterality Date   AORTIC VALVE REPLACEMENT     with root grafting   CARDIAC SURGERY     CHOLECYSTECTOMY     CORONARY ARTERY BYPASS GRAFT  Social History   Tobacco Use  Smoking Status Former  Smokeless Tobacco Never    Social History   Substance and Sexual Activity  Alcohol Use No    Family History  Problem Relation Age of Onset   Liver disease Mother    Pancreatic cancer Father     Review of Systems: As noted in history of present illness. All other systems were reviewed and are negative.  Physical Exam: BP 136/74 (BP Location: Right Arm, Patient Position: Sitting, Cuff Size: Normal)   Pulse 66   Ht 5' 5.5" (1.664 m)   Wt 165 lb (74.8 kg)   BMI 27.04 kg/m  GENERAL:  Well appearing BF in NAD HEENT:  PERRL, EOMI, sclera are clear. Oropharynx is clear. NECK:  No jugular venous distention, carotid upstroke brisk and symmetric, no bruits, no thyromegaly or adenopathy LUNGS:  Clear to auscultation  bilaterally CHEST:  Unremarkable HEART:  RRR,  PMI not displaced or sustained,good mechanical AV click, no S3, no S4: no clicks, no rubs, no murmurs ABD:  Soft, nontender. BS +, no masses or bruits. No hepatomegaly, no splenomegaly EXT:  2 + pulses throughout, no edema, no cyanosis no clubbing SKIN:  Warm and dry.  No rashes NEURO:  Alert and oriented x 3. Cranial nerves II through XII intact. PSYCH:  Cognitively intact    LABORATORY DATA:  Lab Results  Component Value Date   WBC 5.6 12/25/2020   HGB 13.1 12/25/2020   HCT 40.0 12/25/2020   PLT 232 12/25/2020   GLUCOSE 94 12/25/2020   CHOL 161 12/25/2020   TRIG 141 12/25/2020   HDL 49 12/25/2020   LDLDIRECT 126.4 10/07/2012   LDLCALC 87 12/25/2020   ALT 15 12/25/2020   AST 21 12/25/2020   NA 143 12/25/2020   K 4.4 12/25/2020   CL 107 (H) 12/25/2020   CREATININE 0.86 12/25/2020   BUN 12 12/25/2020   CO2 24 12/25/2020   INR 3.6 (A) 12/25/2020   HGBA1C 5.6 11/17/2019   Labs from primary care dated 05/02/15: normal CMET Dated 05/25/15: A1c5.8% Dated 11/10/15: cholesterol 177, triglycerides 91, LDL 110, HDL 49. Dated 05/17/19: glucose 114. A1c 5.6%. BMET otherwise normal. Dated 03/14/20: normal BMET  Ecg today shows NSR rate 66. LAFB, RBBB. LVH. I have personally reviewed and interpreted this study.    Myoview 03/20/16: Study Highlights     The left ventricular ejection fraction is normal (55-65%). Nuclear stress EF: 64%. There was no ST segment deviation noted during stress. Blood pressure demonstrated a normal response to exercise.   Normal study, no evidence for ischemia or infarction     Assessment / Plan: 1. Status post mechanical aortic valve replacement.  Exam is stable. INR check today.  Routine SBE prophylaxis.   2. Status post CABG for ostial coronary disease. No angina. Myoview 03/20/16 was normal. Continue current medical therapy. She is asymptomatic.   3. Hyperlipidemia.   On very low dose Crestor.  Intolerant of multiple meds. Last LDL 87 is stable. Will continue current therapy.  4. Right bundle branch block/LAFB- chronic. She is asymptomatic.  5. HTN  well controlled.   6. Sinus brady noted by Apple watch. On no rate slowing meds. She is asymptomatic so will observe.   She is cleared for excisional biopsy. Routine SBE prophylaxis. For minor procedures such as this we do not recommend holding Coumadin.  I will follow up in 6 months

## 2021-01-03 ENCOUNTER — Telehealth: Payer: Self-pay

## 2021-01-03 ENCOUNTER — Other Ambulatory Visit: Payer: Self-pay

## 2021-01-03 ENCOUNTER — Ambulatory Visit (INDEPENDENT_AMBULATORY_CARE_PROVIDER_SITE_OTHER): Payer: Medicare Other

## 2021-01-03 ENCOUNTER — Ambulatory Visit (INDEPENDENT_AMBULATORY_CARE_PROVIDER_SITE_OTHER): Payer: Medicare Other | Admitting: Cardiology

## 2021-01-03 ENCOUNTER — Encounter: Payer: Self-pay | Admitting: Cardiology

## 2021-01-03 VITALS — BP 136/74 | HR 66 | Ht 65.5 in | Wt 165.0 lb

## 2021-01-03 DIAGNOSIS — Z951 Presence of aortocoronary bypass graft: Secondary | ICD-10-CM | POA: Diagnosis not present

## 2021-01-03 DIAGNOSIS — E785 Hyperlipidemia, unspecified: Secondary | ICD-10-CM | POA: Diagnosis not present

## 2021-01-03 DIAGNOSIS — I359 Nonrheumatic aortic valve disorder, unspecified: Secondary | ICD-10-CM

## 2021-01-03 DIAGNOSIS — Z7901 Long term (current) use of anticoagulants: Secondary | ICD-10-CM | POA: Diagnosis not present

## 2021-01-03 DIAGNOSIS — I10 Essential (primary) hypertension: Secondary | ICD-10-CM

## 2021-01-03 DIAGNOSIS — Z952 Presence of prosthetic heart valve: Secondary | ICD-10-CM | POA: Diagnosis not present

## 2021-01-03 DIAGNOSIS — Z5181 Encounter for therapeutic drug level monitoring: Secondary | ICD-10-CM

## 2021-01-03 LAB — POCT INR: INR: 1.7 — AB (ref 2.0–3.0)

## 2021-01-03 NOTE — Patient Instructions (Signed)
Take 2 tablets tonight only  and then Continue taking 1 tablet daily except 1.5 tablet each Wednesday. Repeat INR in 2 weeks.

## 2021-01-03 NOTE — Telephone Encounter (Signed)
Dr.Jordan's 01/03/21 office note clearing patient for upcoming oral surgery faxed to Dr.Christopher [facility] at fax # [phone].

## 2021-01-17 ENCOUNTER — Other Ambulatory Visit: Payer: Self-pay

## 2021-01-17 ENCOUNTER — Ambulatory Visit (INDEPENDENT_AMBULATORY_CARE_PROVIDER_SITE_OTHER): Payer: Medicare Other

## 2021-01-17 DIAGNOSIS — Z5181 Encounter for therapeutic drug level monitoring: Secondary | ICD-10-CM

## 2021-01-17 DIAGNOSIS — I359 Nonrheumatic aortic valve disorder, unspecified: Secondary | ICD-10-CM

## 2021-01-17 DIAGNOSIS — Z7901 Long term (current) use of anticoagulants: Secondary | ICD-10-CM

## 2021-01-17 LAB — POCT INR: INR: 2.6 (ref 2.0–3.0)

## 2021-01-17 NOTE — Patient Instructions (Signed)
Continue taking 1 tablet daily except 1.5 tablet each Wednesday. Repeat INR in 4 weeks.

## 2021-02-13 ENCOUNTER — Other Ambulatory Visit: Payer: Self-pay

## 2021-02-13 ENCOUNTER — Ambulatory Visit (INDEPENDENT_AMBULATORY_CARE_PROVIDER_SITE_OTHER): Payer: Medicare Other | Admitting: Pharmacist

## 2021-02-13 DIAGNOSIS — Z7901 Long term (current) use of anticoagulants: Secondary | ICD-10-CM

## 2021-02-13 DIAGNOSIS — I359 Nonrheumatic aortic valve disorder, unspecified: Secondary | ICD-10-CM | POA: Diagnosis not present

## 2021-02-13 LAB — POCT INR: INR: 2.2 (ref 2.0–3.0)

## 2021-02-13 NOTE — Patient Instructions (Signed)
Description   Continue taking 1 tablet daily except 1.5 tablet each Wednesday. Repeat INR in 4 weeks.

## 2021-02-19 ENCOUNTER — Other Ambulatory Visit: Payer: Self-pay | Admitting: Cardiology

## 2021-03-14 ENCOUNTER — Ambulatory Visit (INDEPENDENT_AMBULATORY_CARE_PROVIDER_SITE_OTHER): Payer: Medicare Other

## 2021-03-14 ENCOUNTER — Other Ambulatory Visit: Payer: Self-pay

## 2021-03-14 DIAGNOSIS — Z5181 Encounter for therapeutic drug level monitoring: Secondary | ICD-10-CM

## 2021-03-14 DIAGNOSIS — I359 Nonrheumatic aortic valve disorder, unspecified: Secondary | ICD-10-CM | POA: Diagnosis not present

## 2021-03-14 DIAGNOSIS — Z7901 Long term (current) use of anticoagulants: Secondary | ICD-10-CM

## 2021-03-14 LAB — POCT INR: INR: 1.6 — AB (ref 2.0–3.0)

## 2021-03-14 NOTE — Patient Instructions (Signed)
Description   Take 2 tablets today and 1.5 tablets tomorrow and then Continue taking 1 tablet daily except 1.5 tablet each Wednesday. Repeat INR in 3 weeks.

## 2021-03-20 ENCOUNTER — Other Ambulatory Visit: Payer: Self-pay | Admitting: *Deleted

## 2021-03-20 DIAGNOSIS — R7989 Other specified abnormal findings of blood chemistry: Secondary | ICD-10-CM

## 2021-03-29 ENCOUNTER — Other Ambulatory Visit: Payer: Self-pay

## 2021-03-29 ENCOUNTER — Ambulatory Visit (HOSPITAL_COMMUNITY): Payer: Medicare Other | Attending: Cardiovascular Disease

## 2021-03-29 DIAGNOSIS — Z951 Presence of aortocoronary bypass graft: Secondary | ICD-10-CM | POA: Diagnosis not present

## 2021-03-29 DIAGNOSIS — R002 Palpitations: Secondary | ICD-10-CM | POA: Insufficient documentation

## 2021-03-29 DIAGNOSIS — I10 Essential (primary) hypertension: Secondary | ICD-10-CM | POA: Insufficient documentation

## 2021-03-29 DIAGNOSIS — I340 Nonrheumatic mitral (valve) insufficiency: Secondary | ICD-10-CM | POA: Insufficient documentation

## 2021-03-29 DIAGNOSIS — I361 Nonrheumatic tricuspid (valve) insufficiency: Secondary | ICD-10-CM

## 2021-03-29 DIAGNOSIS — R7989 Other specified abnormal findings of blood chemistry: Secondary | ICD-10-CM | POA: Diagnosis not present

## 2021-03-29 DIAGNOSIS — Z952 Presence of prosthetic heart valve: Secondary | ICD-10-CM | POA: Insufficient documentation

## 2021-03-29 DIAGNOSIS — E785 Hyperlipidemia, unspecified: Secondary | ICD-10-CM | POA: Insufficient documentation

## 2021-03-29 LAB — ECHOCARDIOGRAM COMPLETE
AR max vel: 1.14 cm2
AV Area VTI: 1.3 cm2
AV Area mean vel: 1.17 cm2
AV Mean grad: 11.5 mmHg
AV Peak grad: 24.7 mmHg
Ao pk vel: 2.49 m/s
Area-P 1/2: 3.37 cm2
MV M vel: 5.79 m/s
MV Peak grad: 133.9 mmHg
P 1/2 time: 448 msec
S' Lateral: 2.5 cm

## 2021-04-04 ENCOUNTER — Telehealth: Payer: Self-pay

## 2021-04-04 NOTE — Telephone Encounter (Signed)
Lmom to r/s missed appt  

## 2021-04-04 NOTE — Telephone Encounter (Signed)
Pt called and rs appt.

## 2021-04-05 ENCOUNTER — Ambulatory Visit (INDEPENDENT_AMBULATORY_CARE_PROVIDER_SITE_OTHER): Payer: Medicare Other

## 2021-04-05 ENCOUNTER — Other Ambulatory Visit: Payer: Self-pay

## 2021-04-05 DIAGNOSIS — I359 Nonrheumatic aortic valve disorder, unspecified: Secondary | ICD-10-CM

## 2021-04-05 DIAGNOSIS — Z5181 Encounter for therapeutic drug level monitoring: Secondary | ICD-10-CM

## 2021-04-05 DIAGNOSIS — Z7901 Long term (current) use of anticoagulants: Secondary | ICD-10-CM | POA: Diagnosis not present

## 2021-04-05 LAB — POCT INR: INR: 2 (ref 2.0–3.0)

## 2021-04-05 NOTE — Patient Instructions (Signed)
Continue taking 1 tablet daily except 1.5 tablet each Wednesday. Repeat INR in 6 weeks.  ?

## 2021-04-06 ENCOUNTER — Telehealth: Payer: Self-pay | Admitting: *Deleted

## 2021-04-06 NOTE — Telephone Encounter (Signed)
° °  Pre-operative Risk Assessment    Patient Name: Tamara Patterson  DOB: 10/13/1940 MRN: [id number]     Request for Surgical Clearance    Procedure:  Dental Extraction - Amount of Teeth to be Pulled:  EXCISIONAL BIOPSY OF THE LESION LOCATED ON THE LINGUAL OF TOOTH #27   Date of Surgery:  TBD                              Surgeon:   Surgeon's Group or Practice Name:  [facility] Phone number:  [phone] Fax number:  [phone]   Type of Clearance Requested:   - Medical  - Pharmacy:  Hold Warfarin (Coumadin) PREFER HER INR  TO  BETWEEN  2.5 AND 3.0   Type of Anesthesia:  Local    Additional requests/questions:  Please advise surgeon/provider what medications should be held.  Tamara Patterson   04/06/2021, 5:32 PM

## 2021-04-10 ENCOUNTER — Other Ambulatory Visit: Payer: Self-pay | Admitting: Physician Assistant

## 2021-04-10 DIAGNOSIS — I359 Nonrheumatic aortic valve disorder, unspecified: Secondary | ICD-10-CM

## 2021-04-10 MED ORDER — AMOXICILLIN 500 MG PO CAPS: ORAL_CAPSULE | ORAL | 3 refills | Status: DC

## 2021-04-10 NOTE — Telephone Encounter (Signed)
Patient with diagnosis of AVR (St. Jude mechanical) on warfarin for anticoagulation.    Procedure:  EXCISIONAL BIOPSY OF THE LESION LOCATED ON THE LINGUAL OF TOOTH #27  Date of procedure: TBD   CrCl 51 Platelet count 232  Patient does require pre-op antibiotics for dental procedure.  We do not recommend holding warfarin for single extraction.  INR goal is 2.0-3.0 so will aim to keep INR 2-2.5 for procedure.  Once date set, recommend patient arrange for INR appointment 2-3 days prior to procedure.

## 2021-04-10 NOTE — Telephone Encounter (Signed)
° °  Patient Name: Tamara Patterson  DOB: [birth year] MRN: [id number]  Primary Cardiologist: Peter [place], MD  Chart reviewed as part of pre-operative protocol coverage.   Dental extractions of less than 3 teeth are considered low risk procedures per guidelines and generally do not require any specific cardiac clearance. It is also generally accepted that for 2 or less extractions and dental cleanings, there is no need to interrupt blood thinner therapy.  Per our clinical pharmacist: Procedure:  EXCISIONAL BIOPSY OF THE LESION LOCATED ON THE LINGUAL OF TOOTH #27  Date of procedure: TBD     CrCl 51 Platelet count 232   Patient DOES require pre-op antibiotics for dental procedure. I have reordered this and sent to her pharmacy.   We do not recommend holding warfarin for single extraction.  INR goal is 2.0-3.0 so will aim to keep INR 2-2.5 for procedure.  Once date set, recommend patient arrange for INR appointment 2-3 days prior to procedure.    I will route this recommendation to the requesting party via Epic fax function and remove from pre-op pool.  Please call with questions.  Marcelino Duster, PA 04/10/2021, 7:37 AM

## 2021-04-10 NOTE — Telephone Encounter (Signed)
Attempted to reach pt, no answer. LMOM.

## 2021-04-10 NOTE — Telephone Encounter (Signed)
Spoke with patient and made her aware of msg below regarding abx being sent in. She verbalized her understanding and appreciation.

## 2021-05-17 ENCOUNTER — Other Ambulatory Visit: Payer: Self-pay

## 2021-05-17 ENCOUNTER — Ambulatory Visit (INDEPENDENT_AMBULATORY_CARE_PROVIDER_SITE_OTHER): Payer: Medicare Other

## 2021-05-17 DIAGNOSIS — I359 Nonrheumatic aortic valve disorder, unspecified: Secondary | ICD-10-CM

## 2021-05-17 DIAGNOSIS — Z7901 Long term (current) use of anticoagulants: Secondary | ICD-10-CM | POA: Diagnosis not present

## 2021-05-17 DIAGNOSIS — Z5181 Encounter for therapeutic drug level monitoring: Secondary | ICD-10-CM | POA: Diagnosis not present

## 2021-05-17 LAB — POCT INR: INR: 2.3 (ref 2.0–3.0)

## 2021-05-17 NOTE — Patient Instructions (Signed)
Continue taking 1 tablet daily except 1.5 tablet each Wednesday. Repeat INR in 6 weeks.  ?

## 2021-06-28 ENCOUNTER — Ambulatory Visit (INDEPENDENT_AMBULATORY_CARE_PROVIDER_SITE_OTHER): Payer: Medicare Other | Admitting: Psychiatry

## 2021-06-28 ENCOUNTER — Ambulatory Visit (INDEPENDENT_AMBULATORY_CARE_PROVIDER_SITE_OTHER): Payer: Medicare Other

## 2021-06-28 ENCOUNTER — Other Ambulatory Visit: Payer: Self-pay | Admitting: Cardiology

## 2021-06-28 ENCOUNTER — Encounter: Payer: Self-pay | Admitting: Psychiatry

## 2021-06-28 DIAGNOSIS — F338 Other recurrent depressive disorders: Secondary | ICD-10-CM | POA: Diagnosis not present

## 2021-06-28 DIAGNOSIS — Z5181 Encounter for therapeutic drug level monitoring: Secondary | ICD-10-CM

## 2021-06-28 DIAGNOSIS — I359 Nonrheumatic aortic valve disorder, unspecified: Secondary | ICD-10-CM | POA: Diagnosis not present

## 2021-06-28 DIAGNOSIS — Z7901 Long term (current) use of anticoagulants: Secondary | ICD-10-CM

## 2021-06-28 DIAGNOSIS — F4001 Agoraphobia with panic disorder: Secondary | ICD-10-CM

## 2021-06-28 LAB — POCT INR: INR: 2.1 (ref 2.0–3.0)

## 2021-06-28 MED ORDER — ESCITALOPRAM OXALATE 20 MG PO TABS: 20.0000 mg | ORAL_TABLET | Freq: Every day | ORAL | 2 refills | Status: DC

## 2021-06-28 NOTE — Patient Instructions (Signed)
Continue taking 1 tablet daily except 1.5 tablet each Wednesday. Repeat INR in 6 weeks.  ?

## 2021-06-28 NOTE — Progress Notes (Signed)
Tamara SchwalbeAnne S Krack ?[id number] ?May 10, 1940 ?81 y.o.  ? ? ?Subjective:  ? ?Patient ID:  Tamara Schwalbenne S Indelicato is a 81 y.o. (DOB May 10, 1940) female. ? ?Chief Complaint:  ?Chief Complaint  ?Patient presents with  ? Follow-up  ? Medication Reaction  ? ? ?HPI ?Tamara Schwalbenne S Ditmars presents to the office today for follow-up of panic disorder with agoraphobia, seasonal depression, and history of a fear of disease or phobia of disease. ? ?seen March 2019 & 02/2019.  No meds were changed.  She was can encouraged to use her light box in the winter for seasonal depression and to maintain adequate levels of vitamin D. ? ?03/21/2020 appt with following noted: ?Remains on Lexapro 20 mg daily and no other psych meds. ?Done very well with mood and anxiety.  Still isolated.  No panic. ?Little tics or tremors in mouth.  Sort of like a jerk.  Not really bothering her.  Had for a couple of years and comes and goes and read about it. ?Plan: use lightbox ?Continue Lexapro 20 mg daily vs reduce the dosage bc tics and tremors.  Rec not reduce until Spring.   ?April reduce to 15 mg for a couple of months to see if this is better.  Disc risk relapse.  They may not be related.  Rec she discussed this with her primary care doc when she sees the doc soon.  It is not typical for SSRIs to cause tremors or tics but it is not impossible. ? ?12/21/2020 appt noted: ?Stayed on Lexapro 20 mg daily.  Tremor are not real bad but bothersome at times in hands. Moood good so far bbut seasonal downturn likely.  Then lightbox usually helps.  No panic or unusual anxiety. ?No SE ?Joined fitness center and doing yoga ? ?Patient reports stable mood and denies depressed or irritable moods.  Patient denies any recent difficulty with anxiety.  Patient denies difficulty with sleep initiation or maintenance. Denies appetite disturbance.  Patient reports that energy and motivation have been good.  Patient denies any difficulty with concentration.  Patient denies any suicidal ideation. ? ?06/28/21  appt noted: ?Fine without panic. ?Still tremor.  Can affect handwriting.  Otherwise not that much of a problem.   ?Didn't need lightbox this winter and without depression. ?Hx seasonal dep worse Jan. ? ?Past psych med trials include citalopram 40, Lexapro 20, light box, trazodone no response, mirtazapine ? ?Review of Systems:  ?Review of Systems  ?Musculoskeletal:  Positive for arthralgias. Negative for joint swelling.  ?Neurological:  Positive for tremors. Negative for weakness.  ? ?Medications: I have reviewed the patient's current medications. ? ?Current Outpatient Medications  ?Medication Sig Dispense Refill  ? amoxicillin (AMOXIL) 500 MG capsule Take 2000 mg (4 capsules) 30-60 min prior to dental procedures. 4 capsule 3  ? Barberry-Oreg Grape-Goldenseal (BERBERINE COMPLEX PO) Take 2 tablets by mouth daily.    ? Calcium Carbonate (CALCIUM 600 PO) Take 1 tablet by mouth daily.    ? Cholecalciferol (VITAMIN D3) 2000 units TABS Take 2,000 Units by mouth daily.    ? escitalopram (LEXAPRO) 20 MG tablet Take 1 tablet (20 mg total) by mouth daily. 90 tablet 2  ? NON FORMULARY Take 1 capsule by mouth daily. "Anxiety Free" B complex supplement    ? Omega-3 Fatty Acids (FISH OIL) 1000 MG CAPS Take 2,000 mg by mouth daily.    ? rosuvastatin (CRESTOR) 5 MG tablet Take 1 tablet by mouth once daily 90 tablet 2  ? warfarin (COUMADIN) 5  MG tablet TAKE 1-2 TABLETS BY MOUTH ONCE DAILY OR AS DIRECTED BY COUMADIN CLINIC 135 tablet 0  ? XIIDRA 5 % SOLN INSTILL 1 DROP INTO EACH EYE TWICE DAILY    ? amLODipine (NORVASC) 2.5 MG tablet Take 1 tablet (2.5 mg total) by mouth daily for 3 days. 90 tablet 1  ? ?No current facility-administered medications for this visit.  ? ? ?Medication Side Effects: None ? ?Allergies:  ?Allergies  ?Allergen Reactions  ? Codeine Nausea Only  ? Lipitor [Atorvastatin Calcium] Other (See Comments)  ?  Caused muscle aching  ? Atenolol Other (See Comments)  ?  cramps  ? ? ?Past Medical History:  ?Diagnosis Date   ? Anticoagulant long-term use   ? CAD (coronary artery disease)   ? Depression   ? seasonal  ? Dyslipidemia   ? GERD (gastroesophageal reflux disease)   ? Giant cell arteritis (HCC)   ? per aortotomy biopsy in 1991  ? HTN (hypertension)   ? Hx of CABG 1991  ? LIMA-LAD, SVG-RCA, SVG-1st and 2ndOM  ? Panic anxiety syndrome   ? S/P AVR (aortic valve replacement)   ? with root grafting; #16mm  [employer] valve conduit; Last Echo 2007  ? ? ?Family History  ?Problem Relation Age of Onset  ? Liver disease Mother   ? Pancreatic cancer Father   ? ? ?Social History  ? ?Socioeconomic History  ? Marital status: Married  ?  Spouse name: Not on file  ? Number of children: 0  ? Years of education: Not on file  ? Highest education level: Not on file  ?Occupational History  ? Occupation: social work  ?  Employer: RETIRED  ?  Comment: retired  ?Tobacco Use  ? Smoking status: Former  ? Smokeless tobacco: Never  ?Vaping Use  ? Vaping Use: Never used  ?Substance and Sexual Activity  ? Alcohol use: No  ? Drug use: No  ? Sexual activity: Not on file  ?Other Topics Concern  ? Not on file  ?Social History Narrative  ? Not on file  ? ?Social Determinants of Health  ? ?Financial Resource Strain: Not on file  ?Food Insecurity: Not on file  ?Transportation Needs: Not on file  ?Physical Activity: Not on file  ?Stress: Not on file  ?Social Connections: Not on file  ?Intimate Partner Violence: Not on file  ? ? ?Past Medical History, Surgical history, Social history, and Family history were reviewed and updated as appropriate.  ? ?Please see review of systems for further details on the patient's review from today.  ? ?Objective:  ? ?Physical Exam:  ?There were no vitals taken for this visit. ? ?Physical Exam ?Constitutional:   ?   General: She is not in acute distress. ?Musculoskeletal:     ?   General: No deformity.  ?Neurological:  ?   Mental Status: She is alert and oriented to person, place, and time.  ?   Cranial Nerves: No dysarthria.  ?    Coordination: Coordination normal.  ?Psychiatric:     ?   Attention and Perception: Attention and perception normal. She does not perceive auditory or visual hallucinations.     ?   Mood and Affect: Mood normal. Mood is not anxious or depressed. Affect is not labile, blunt, angry or inappropriate.     ?   Speech: Speech normal.     ?   Behavior: Behavior normal. Behavior is cooperative.     ?   Thought Content:  Thought content normal. Thought content is not paranoid or delusional. Thought content does not include homicidal or suicidal ideation. Thought content does not include suicidal plan.     ?   Cognition and Memory: Cognition and memory normal.     ?   Judgment: Judgment normal.  ?   Comments: Anxiety managed.  ? ? ?Lab Review:  ?   ?Component Value Date/Time  ? NA 143 12/25/2020 0922  ? K 4.4 12/25/2020 0922  ? CL 107 (H) 12/25/2020 1638  ? CO2 24 12/25/2020 0922  ? GLUCOSE 94 12/25/2020 0922  ? GLUCOSE 111 (H) 10/10/2016 4665  ? BUN 12 12/25/2020 0922  ? CREATININE 0.86 12/25/2020 0922  ? CREATININE 0.82 05/02/2015 1020  ? CALCIUM 9.2 12/25/2020 0922  ? PROT 6.4 12/25/2020 0922  ? ALBUMIN 4.4 12/25/2020 0922  ? AST 21 12/25/2020 0922  ? ALT 15 12/25/2020 0922  ? ALKPHOS 115 12/25/2020 0922  ? BILITOT 0.8 12/25/2020 0922  ? GFRNONAA 67 11/17/2019 0907  ? GFRAA 77 11/17/2019 0907  ? ? ?   ?Component Value Date/Time  ? WBC 5.6 12/25/2020 0922  ? WBC 5.5 10/10/2016 0828  ? RBC 4.86 12/25/2020 0922  ? RBC 5.23 (H) 10/10/2016 9935  ? HGB 13.1 12/25/2020 0922  ? HCT 40.0 12/25/2020 0922  ? PLT 232 12/25/2020 0922  ? MCV 82 12/25/2020 0922  ? MCH 27.0 12/25/2020 0922  ? MCH 26.4 10/10/2016 0828  ? MCHC 32.8 12/25/2020 0922  ? MCHC 32.4 10/10/2016 0828  ? RDW 13.3 12/25/2020 0922  ? LYMPHSABS 2.2 12/25/2020 7017  ? MONOABS 0.7 04/13/2013 1057  ? EOSABS 0.2 12/25/2020 0922  ? BASOSABS 0.1 12/25/2020 7939  ? ? ?No results found for: POCLITH, LITHIUM  ? ?No results found for: PHENYTOIN, PHENOBARB, VALPROATE, CBMZ   ? ?.res ?Assessment: Plan:   ? ?Allyiah was seen today for follow-up and medication reaction. ? ?Diagnoses and all orders for this visit: ? ?Seasonal depression (HCC) ? ?Panic disorder with agoraphobia ? ?  ? Gr

## 2021-07-03 ENCOUNTER — Other Ambulatory Visit: Payer: Self-pay

## 2021-07-03 DIAGNOSIS — F338 Other recurrent depressive disorders: Secondary | ICD-10-CM

## 2021-07-03 DIAGNOSIS — F4001 Agoraphobia with panic disorder: Secondary | ICD-10-CM

## 2021-07-03 MED ORDER — ESCITALOPRAM OXALATE 10 MG PO TABS: 20.0000 mg | ORAL_TABLET | Freq: Every day | ORAL | 0 refills | Status: DC

## 2021-07-10 NOTE — Progress Notes (Addendum)
RENARDA, MULLINIX ([id number]) ?Visit Report for 07/11/2021 ?Allergy List Details ?Patient Name: Date of Service: ?HEA RD, A NNE S. 07/11/2021 8:00 A M ?Medical Record Number: [id number] ?Patient Account Number: [id number] ?Date of Birth/Sex: Treating RN: ?28-Jan-1941 (81 y.o. Roel Cluck ?Primary Care Sima Lindenberger: Loura Back Other Clinician: ?Referring Kaiyan Luczak: ?Treating Jory Tanguma/Extender: Lenda Kelp ?NGUYEN, KIM ?Weeks in Treatment: 0 ?Allergies ?Active Allergies ?codeine ?Lipitor ?atenolol ?Allergy Notes ?Electronic Signature(s) ?Signed: 07/10/2021 4:36:31 PM By: Antonieta Iba ?Entered By: Antonieta Iba on 07/10/2021 16:36:30 ?-------------------------------------------------------------------------------- ?Arrival Information Details ?Patient Name: Date of Service: ?HEA RD, A NNE S. 07/11/2021 8:00 A M ?Medical Record Number: [id number] ?Patient Account Number: [id number] ?Date of Birth/Sex: Treating RN: ?[birth year] (81 y.o. Roel Cluck ?Primary Care Anahlia Iseminger: Loura Back Other Clinician: ?Referring Kasra Melvin: ?Treating Parth Mccormac/Extender: Lenda Kelp ?NGUYEN, KIM ?Weeks in Treatment: 0 ?Visit Information ?Patient Arrived: Ambulatory ?Arrival Time: 08:04 ?Transfer Assistance: None ?Patient Identification Verified: Yes ?Secondary Verification Process Completed: Yes ?Patient Requires Transmission-Based Precautions: No ?Patient Has Alerts: Yes ?Patient Alerts: Patient on Blood Thinner ?Electronic Signature(s) ?Signed: 07/11/2021 4:05:30 PM By: Antonieta Iba ?Entered By: Antonieta Iba on 07/11/2021 08:17:43 ?-------------------------------------------------------------------------------- ?Clinic Level of Care Assessment Details ?Patient Name: Date of Service: ?HEA RD, A NNE S. 07/11/2021 8:00 A M ?Medical Record Number: [id number] ?Patient Account Number: [id number] ?Date of Birth/Sex: Treating RN: ?[birth year] (81 y.o. Roel Cluck ?Primary Care Triana Coover: Loura Back Other Clinician: ?Referring  Maddyx Vallie: ?Treating Kateryna Grantham/Extender: Lenda Kelp ?NGUYEN, KIM ?Weeks in Treatment: 0 ?Clinic Level of Care Assessment Items ?TOOL 2 Quantity Score ?X- 1 0 ?Use when only an EandM is performed on the INITIAL visit ?ASSESSMENTS - Nursing Assessment / Reassessment ?X- 1 20 ?General Physical Exam (combine w/ comprehensive assessment (listed just below) when performed on new pt. evals) ?X- 1 25 ?Comprehensive Assessment (HX, ROS, Risk Assessments, Wounds Hx, etc.) ?ASSESSMENTS - Wound and Skin A ssessment / Reassessment ?[]  - 0 ?Simple Wound Assessment / Reassessment - one wound ?[]  - 0 ?Complex Wound Assessment / Reassessment - multiple wounds ?X- 1 10 ?Dermatologic / Skin Assessment (not related to wound area) ?ASSESSMENTS - Ostomy and/or Continence Assessment and Care ?[]  - 0 ?Incontinence Assessment and Management ?[]  - 0 ?Ostomy Care Assessment and Management (repouching, etc.) ?PROCESS - Coordination of Care ?[]  - 0 ?Simple Patient / Family Education for ongoing care ?[]  - 0 ?Complex (extensive) Patient / Family Education for ongoing care ?X- 1 10 ?Staff obtains Consents, Records, T Results / Process Orders ?est ?[]  - 0 ?Staff telephones HHA, Nursing Homes / Clarify orders / etc ?[]  - 0 ?Routine Transfer to another Facility (non-emergent condition) ?[]  - 0 ?Routine Hospital Admission (non-emergent condition) ?[]  - 0 ?New Admissions / / Ordering NPWT Apligraf, etc. ?, ?[]  - 0 ?Emergency Hospital Admission (emergent condition) ?[]  - 0 ?Simple Discharge Coordination ?[]  - 0 ?Complex (extensive) Discharge Coordination ?PROCESS - Special Needs ?[]  - 0 ?Pediatric / Minor Patient Management ?[]  - 0 ?Isolation Patient Management ?[]  - 0 ?Hearing / Language / Visual special needs ?[]  - 0 ?Assessment of Community assistance (transportation, D/C planning, etc.) ?[]  - 0 ?Additional assistance / Altered mentation ?[]  - 0 ?Support Surface(s) Assessment (bed, cushion, seat, etc.) ?INTERVENTIONS -  Wound Cleansing / Measurement ?[]  - 0 ?Wound Imaging (photographs - any number of wounds) ?[]  - 0 ?Wound Tracing (instead of photographs) ?[]  - 0 ?Simple Wound Measurement - one wound ?[]  - 0 ?Complex Wound Measurement - multiple wounds ?[]  - 0 ?Simple Wound Cleansing -  one wound ?[]  - 0 ?Complex Wound Cleansing - multiple wounds ?INTERVENTIONS - Wound Dressings ?[]  - 0 ?Small Wound Dressing one or multiple wounds ?[]  - 0 ?Medium Wound Dressing one or multiple wounds ?[]  - 0 ?Large Wound Dressing one or multiple wounds ?[]  - 0 ?Application of Medications - injection ?INTERVENTIONS - Miscellaneous ?[]  - 0 ?External ear exam ?[]  - 0 ?Specimen Collection (cultures, biopsies, blood, body fluids, etc.) ?[]  - 0 ?Specimen(s) / Culture(s) sent or taken to Lab for analysis ?[]  - 0 ?Patient Transfer (multiple staff / / Similar devices) ?[]  - 0 ?Simple Staple / Suture removal (25 or less) ?[]  - 0 ?Complex Staple / Suture removal (26 or more) ?[]  - 0 ?Hypo / Hyperglycemic Management (close monitor of Blood Glucose) ?[]  - 0 ?Ankle / Brachial Index (ABI) - do not check if billed separately ?Has the patient been seen at the hospital within the last three years: Yes ?Total Score: 65 ?Level Of Care: New/Established - Level 2 ?Electronic Signature(s) ?Signed: 07/11/2021 4:05:30 PM By: ?Entered By: on 07/11/2021 08:29:57 ?-------------------------------------------------------------------------------- ?Encounter Discharge Information Details ?Patient Name: Date of Service: ?HEA RD, A NNE S. 07/11/2021 8:00 A M ?Medical Record Number: ?Patient Account Number: ?Date of Birth/Sex: Treating RN: ?11-07-1940 (81 y.o. ?Primary Care Jermone Geister: Other Clinician: ?Referring Ranvir Renovato: ?Treating Dshaun Reppucci/Extender: ?NGUYEN, KIM ?Weeks in Treatment: 0 ?Encounter Discharge Information Items ?Discharge Condition: Stable ?Ambulatory Status:  Ambulatory ?Discharge Destination: Home ?Transportation: Private Auto ?Schedule Follow-up Appointment: No ?Clinical Summary of Care: Provided on 07/11/2021 ?Form Type Recipient ?Paper Patient Patient ?Electronic Signature(s) ?Signed: 07/11/2021 4:05:30 PM By: Antonieta Iba ?Entered By: Antonieta Iba on 07/11/2021 08:30:45 ?-------------------------------------------------------------------------------- ?Patient/Caregiver Education Details ?Patient Name: ?Date of Service: ?HEA RD, A NNE S. 4/26/2023andnbsp8:00 A M ?Medical Record Number: [id number] ?Patient Account Number: [id number] ?Date of Birth/Gender: ?Treating RN: ?Aug 25, 1940 (81 y.o. Roel Cluck ?Primary Care Physician: Loura Back ?Other Clinician: ?Referring Physician: ?Treating Physician/Extender: Lenda Kelp ?NGUYEN, KIM ?Weeks in Treatment: 0 ?Education Assessment ?Education Provided To: ?Patient ?Education Topics Provided ?Notes ?Gave information for [facility] ?Electronic Signature(s) ?Signed: 07/11/2021 4:05:30 PM By: Antonieta Iba ?Entered By: Antonieta Iba on 07/11/2021 08:29:18 ?-------------------------------------------------------------------------------- ?Vitals Details ?Patient Name: ?Date of Service: ?HEA RD, A NNE S. 07/11/2021 8:00 A M ?Medical Record Number: [id number] ?Patient Account Number: [id number] ?Date of Birth/Sex: ?Treating RN: ?[birth year] (81 y.o. Roel Cluck ?Primary Care Vinson Tietze: Loura Back ?Other Clinician: ?Referring Renna Kilmer: ?Treating Jeanene Mena/Extender: Lenda Kelp ?NGUYEN, KIM ?Weeks in Treatment: 0 ?Vital Signs ?Time Taken: 08:20 ?Temperature (??F): 97.8 ?Pulse (bpm): 80 ?Respiratory Rate (breaths/min): 16 ?Blood Pressure (mmHg): 134/78 ?Reference Range: 80 - 120 mg / dl ?Electronic Signature(s) ?Signed: 07/11/2021 4:05:30 PM By: 07/13/2021 ?Entered By: Antonieta Iba on 07/11/2021 08:21:51 ?

## 2021-07-11 ENCOUNTER — Encounter (HOSPITAL_BASED_OUTPATIENT_CLINIC_OR_DEPARTMENT_OTHER): Payer: Medicare Other | Attending: General Surgery | Admitting: Physician Assistant

## 2021-07-11 DIAGNOSIS — L988 Other specified disorders of the skin and subcutaneous tissue: Secondary | ICD-10-CM | POA: Insufficient documentation

## 2021-07-11 NOTE — Progress Notes (Signed)
ALISANDE, TREECE ([id number]) ?Visit Report for 07/11/2021 ?Chief Complaint Document Details ?Patient Name: Date of Service: ?HEA RD, A NNE S. 07/11/2021 8:00 A M ?Medical Record Number: [id number] ?Patient Account Number: [id number] ?Date of Birth/Sex: Treating RN: ?05/13/1940 (81 y.o. F) Scotton, Colvert ?Primary Care Provider: Arthur Holms Other Clinician: ?Referring Provider: ?Treating Provider/Extender: Worthy Keeler ?NGUYEN, KIM ?Weeks in Treatment: 0 ?Information Obtained from: Patient ?Chief Complaint ?Right leg rash/discoloration ?Electronic Signature(s) ?Signed: 07/11/2021 10:23:31 AM By: Worthy Keeler PA-C ?Entered By: Worthy Keeler on 07/11/2021 10:23:31 ?-------------------------------------------------------------------------------- ?HPI Details ?Patient Name: Date of Service: ?HEA RD, A NNE S. 07/11/2021 8:00 A M ?Medical Record Number: [id number] ?Patient Account Number: [id number] ?Date of Birth/Sex: Treating RN: ?12/10/1940 (81 y.o. F) Scotton, Cerezo ?Primary Care Provider: Arthur Holms Other Clinician: ?Referring Provider: ?Treating Provider/Extender: Worthy Keeler ?NGUYEN, KIM ?Weeks in Treatment: 0 ?History of Present Illness ?HPI Description: 07-11-2021 upon evaluation today patient appears to be doing unfortunately somewhat poorly in regard to an area on her lower extremity/shin ?location. She actually has been under the care of of a dermatologist locally. She tells me this is actually been there for about 20 years. She has previously ?had a biopsy where diagnosis was confirmed she cannot remember what that was and forgot to bring that paper with her today. With that being said there does ?not appear to be any signs of any open wounds at this time. Fortunately I think she is doing quite well in that regard. Unfortunately it is having continued ?issues with a noticeable area of discoloration which does bother her and to be honest it does itch around the edges well where there is more  irritation. ?Subsequently I do believe that the patient could benefit from a second opinion probably with [facility] dermatology but I do not see anything really from a wound ?care perspective that we will get a be able to do for her here to be honest. That was discussed with her today. ?Electronic Signature(s) ?Signed: 07/11/2021 10:23:38 AM By: Worthy Keeler PA-C ?Entered By: Worthy Keeler on 07/11/2021 10:23:38 ?-------------------------------------------------------------------------------- ?Physical Exam Details ?Patient Name: Date of Service: ?HEA RD, A NNE S. 07/11/2021 8:00 A M ?Medical Record Number: [id number] ?Patient Account Number: [id number] ?Date of Birth/Sex: Treating RN: ?[birth year] (81 y.o. F) Scotton, Wever ?Primary Care Provider: Other Clinician: ?Arthur Holms ?Referring Provider: ?Treating Provider/Extender: Worthy Keeler ?NGUYEN, KIM ?Weeks in Treatment: 0 ?Constitutional ?sitting or standing blood pressure is within target range for patient.. pulse regular and within target range for patient.Marland Kitchen respirations regular, non-labored and within ?target range for patient.Marland Kitchen temperature within target range for patient.. Well-nourished and well-hydrated in no acute distress. ?Eyes ?conjunctiva clear no eyelid edema noted. pupils equal round and reactive to light and accommodation. ?Ears, Nose, Mouth, and Throat ?no gross abnormality of ear auricles or external auditory canals. normal hearing noted during conversation. mucus membranes moist. ?Respiratory ?normal breathing without difficulty. ?Cardiovascular ?2+ dorsalis pedis/posterior tibialis pulses. no clubbing, cyanosis, significant edema, <3 sec cap refill. ?Musculoskeletal ?normal gait and posture. ?Psychiatric ?this patient is able to make decisions and demonstrates good insight into disease process. Alert and Oriented x 3. pleasant and cooperative. ?Notes ?Upon evaluation there was no open wound this does appear to be more of a dermatologic issue in my  opinion in general and I do not really see much that I can ?do here. I do believe her optimal plan would be to follow-up with [facility] dermatology for second opinion I did print that  information off for her today otherwise I ?see nothing that seems to be life or limb threatening here but again it is definitely an aggravation for her to be sure. She does not appear to be swollen at all ?from the standpoint of lymphedema ?Electronic Signature(s) ?Signed: 07/11/2021 10:24:15 AM By: Worthy Keeler PA-C ?Entered By: Worthy Keeler on 07/11/2021 10:24:14 ?-------------------------------------------------------------------------------- ?Physician Orders Details ?Patient Name: Date of Service: ?HEA RD, A NNE S. 07/11/2021 8:00 A M ?Medical Record Number: [id number] ?Patient Account Number: [id number] ?Date of Birth/Sex: Treating RN: ?[birth year] (81 y.o. Sue Lush ?Primary Care Provider: Arthur Holms Other Clinician: ?Referring Provider: ?Treating Provider/Extender: Worthy Keeler ?NGUYEN, KIM ?Weeks in Treatment: 0 ?Verbal / Phone Orders: No ?Diagnosis Coding ?Follow-up Appointments ?Other: - No follow up, consult only. ?Recommend getting appointment with [facility]. ?Electronic Signature(s) ?Signed: 07/11/2021 4:00:03 PM By: Worthy Keeler PA-C ?Signed: 07/11/2021 4:05:30 PM By: Lorrin Jackson ?Entered By: Lorrin Jackson on 07/11/2021 08:19:06 ?-------------------------------------------------------------------------------- ?Problem List Details ?Patient Name: Date of Service: ?HEA RD, A NNE S. 07/11/2021 8:00 A M ?Medical Record Number: [id number] ?Patient Account Number: [id number] ?Date of Birth/Sex: Treating RN: ?[birth year] (81 y.o. F) Scotton, Hoss ?Primary Care Provider: Arthur Holms Other Clinician: ?Referring Provider: ?Treating Provider/Extender: Worthy Keeler ?NGUYEN, KIM ?Weeks in Treatment: 0 ?Active Problems ?ICD-10 ?Encounter ?Code Description Active Date MDM ?Diagnosis ?L98.8 Other specified disorders  of the skin and subcutaneous tissue 07/11/2021 No Yes ?Inactive Problems ?Resolved Problems ?Electronic Signature(s) ?Signed: 07/11/2021 10:23:14 AM By: Worthy Keeler PA-C ?Entered By: Worthy Keeler on 07/11/2021 10:23:14 ?-------------------------------------------------------------------------------- ?Progress Note Details ?Patient Name: Date of Service: ?HEA RD, A NNE S. 07/11/2021 8:00 A M ?Medical Record Number: [id number] ?Patient Account Number: [id number] ?Date of Birth/Sex: Treating RN: ?Jun 15, 1940 (81 y.o. F) Scotton, Raines ?Primary Care Provider: Arthur Holms Other Clinician: ?Referring Provider: ?Treating Provider/Extender: Worthy Keeler ?NGUYEN, KIM ?Weeks in Treatment: 0 ?Subjective ?Chief Complaint ?Information obtained from Patient ?Right leg rash/discoloration ?History of Present Illness (HPI) ?07-11-2021 upon evaluation today patient appears to be doing unfortunately somewhat poorly in regard to an area on her lower extremity/shin location. She ?actually has been under the care of of a dermatologist locally. She tells me this is actually been there for about 20 years. She has previously had a biopsy ?where diagnosis was confirmed she cannot remember what that was and forgot to bring that paper with her today. With that being said there does not appear to ?be any signs of any open wounds at this time. Fortunately I think she is doing quite well in that regard. Unfortunately it is having continued issues with a ?noticeable area of discoloration which does bother her and to be honest it does itch around the edges well where there is more irritation. Subsequently I do ?believe that the patient could benefit from a second opinion probably with [facility] dermatology but I do not see anything really from a wound care perspective ?that we will get a be able to do for her here to be honest. That was discussed with her today. ?Patient History ?Information obtained from Patient, Chart. ?Allergies ?codeine, Lipitor,  atenolol ?Family History ?Cancer - Father. ?Medical History ?Cardiovascular ?Patient has history of Coronary Artery Disease, Hypertension ?Medical A Surgical History Notes ?nd ?Cardiovascular ?Hx of CABG ?[place]

## 2021-08-06 ENCOUNTER — Ambulatory Visit (INDEPENDENT_AMBULATORY_CARE_PROVIDER_SITE_OTHER): Payer: Medicare Other

## 2021-08-06 DIAGNOSIS — Z5181 Encounter for therapeutic drug level monitoring: Secondary | ICD-10-CM | POA: Diagnosis not present

## 2021-08-06 DIAGNOSIS — I359 Nonrheumatic aortic valve disorder, unspecified: Secondary | ICD-10-CM

## 2021-08-06 DIAGNOSIS — Z7901 Long term (current) use of anticoagulants: Secondary | ICD-10-CM

## 2021-08-06 LAB — POCT INR: INR: 3.2 — AB (ref 2.0–3.0)

## 2021-08-06 NOTE — Patient Instructions (Signed)
HOLD TODAY ONLY and then Continue taking 1 tablet daily except 1.5 tablet each Wednesday. Repeat INR in 6 weeks. [phone]

## 2021-08-25 NOTE — Progress Notes (Unsigned)
Tamara Patterson Date of Birth: [birth year]   History of Present Illness: Tamara Patterson is seen today for followup. 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 This included mechanical aortic valve replacement and grafting of the aortic root with a #23 mm St. Jude valve conduit. She also had coronary bypass surgery including an IMA graft to the LAD, saphenous vein graft to the right coronary, and saphenous vein graft to the first and second obtuse marginal vessels. 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 She was treated with pulse steroids for a period of time and was weaned off of this. Last Myoview study was in January 2018 and was normal. Echo in 2011 was satisfactory.  She has been intolerant of statins and Zetia due to myalgias. Also intolerant of Welchol due to hypoglycemia.  Echo in Jan 2023 showed normal LV function and normal prosthetic AV function.  On follow up today she is doing well. Energy level is good.  No chest pain, dyspnea, palpitations, dizziness.  No edema. No bleeding. BP at home has been well controlled. She has not been as active and has gained 4 lbs.   Current Outpatient Medications on File Prior to Visit  Medication Sig Dispense Refill   amLODipine (NORVASC) 2.5 MG tablet Take 1 tablet (2.5 mg total) by mouth daily for 3 days. 90 tablet 1   Barberry-Oreg Grape-Goldenseal (BERBERINE COMPLEX PO) Take 2 tablets by mouth daily.     Cholecalciferol (VITAMIN D3) 2000 units TABS Take 2,000 Units by mouth daily.     clobetasol ointment (TEMOVATE) 0.05 % clobetasol 0.05 % topical ointment  APPLY OINTMENT TOPICALLY TO AFFECTED AREA TWICE DAILY     escitalopram (LEXAPRO) 10 MG tablet Take 2 tablets (20 mg total) by mouth daily. 180 tablet 0   neomycin-polymyxin b-dexamethasone (MAXITROL) 3.5-10000-0.1 OINT      RESTASIS 0.05 % ophthalmic emulsion 1 drop 2 (two) times daily.     rosuvastatin  (CRESTOR) 5 MG tablet Take 1 tablet by mouth once daily 90 tablet 2   warfarin (COUMADIN) 5 MG tablet TAKE 1-2 TABLETS BY MOUTH ONCE DAILY OR AS DIRECTED BY COUMADIN CLINIC 135 tablet 0   amoxicillin (AMOXIL) 500 MG capsule Take 2000 mg (4 capsules) 30-60 min prior to dental procedures. (Patient not taking: Reported on 08/29/2021) 4 capsule 3   No current facility-administered medications on file prior to visit.    Allergies  Allergen Reactions   Codeine Nausea Only   Lipitor [Atorvastatin Calcium] Other (See Comments)    Caused muscle aching   Atenolol Other (See Comments)    cramps    Past Medical History:  Diagnosis Date   Anticoagulant long-term use    CAD (coronary artery disease)    Depression    seasonal   Dyslipidemia    GERD (gastroesophageal reflux disease)    Giant cell arteritis (HCC)    per aortotomy biopsy in 1991   HTN (hypertension)    Hx of CABG 1991   LIMA-LAD, SVG-RCA, SVG-1st and 2ndOM   Panic anxiety syndrome    S/P AVR (aortic valve replacement)    with root grafting; #5623mm  St. Jude valve conduit; Last Echo 2007    Past Surgical History:  Procedure Laterality Date   AORTIC VALVE REPLACEMENT     with root grafting   CARDIAC SURGERY     CHOLECYSTECTOMY     CORONARY ARTERY BYPASS GRAFT  Social History   Tobacco Use  Smoking Status Former  Smokeless Tobacco Never    Social History   Substance and Sexual Activity  Alcohol Use No    Family History  Problem Relation Age of Onset   Liver disease Mother    Pancreatic cancer Father     Review of Systems: As noted in history of present illness. All other systems were reviewed and are negative.  Physical Exam: BP (!) 151/82 (BP Location: Left Arm, Patient Position: Sitting, Cuff Size: Normal)   Pulse (!) 57   Ht 5\' 5"  (1.651 m)   Wt 169 lb (76.7 kg)   SpO2 100%   BMI 28.12 kg/m  GENERAL:  Well appearing BF in NAD HEENT:  PERRL, EOMI, sclera are clear. Oropharynx is clear. NECK:   No jugular venous distention, carotid upstroke brisk and symmetric, no bruits, no thyromegaly or adenopathy LUNGS:  Clear to auscultation bilaterally CHEST:  Unremarkable HEART:  RRR,  PMI not displaced or sustained,good mechanical AV click, no S3, no S4: no clicks, no rubs, no murmurs ABD:  Soft, nontender. BS +, no masses or bruits. No hepatomegaly, no splenomegaly EXT:  2 + pulses throughout, no edema, no cyanosis no clubbing SKIN:  Warm and dry.  No rashes NEURO:  Alert and oriented x 3. Cranial nerves II through XII intact. PSYCH:  Cognitively intact    LABORATORY DATA:  Lab Results  Component Value Date   WBC 5.6 12/25/2020   HGB 13.1 12/25/2020   HCT 40.0 12/25/2020   PLT 232 12/25/2020   GLUCOSE 94 12/25/2020   CHOL 161 12/25/2020   TRIG 141 12/25/2020   HDL 49 12/25/2020   LDLDIRECT 126.4 10/07/2012   LDLCALC 87 12/25/2020   ALT 15 12/25/2020   AST 21 12/25/2020   NA 143 12/25/2020   K 4.4 12/25/2020   CL 107 (H) 12/25/2020   CREATININE 0.86 12/25/2020   BUN 12 12/25/2020   CO2 24 12/25/2020   INR 3.2 (A) 08/06/2021   HGBA1C 5.6 11/17/2019   Labs from primary care dated 05/02/15: normal CMET Dated 05/25/15: A1c5.8% Dated 11/10/15: cholesterol 177, triglycerides 91, LDL 110, HDL 49. Dated 05/17/19: glucose 114. A1c 5.6%. BMET otherwise normal. Dated 03/14/20: normal BMET  Ecg today shows NSR rate 57. LAFB, RBBB. LVH. I have personally reviewed and interpreted this study.    Myoview 03/20/16: Study Highlights     The left ventricular ejection fraction is normal (55-65%). Nuclear stress EF: 64%. There was no ST segment deviation noted during stress. Blood pressure demonstrated a normal response to exercise.   Normal study, no evidence for ischemia or infarction   Echo 03/29/21: IMPRESSIONS     1. Left ventricular ejection fraction, by estimation, is 60 to 65%. The  left ventricle has normal function. The left ventricle has no regional  wall motion  abnormalities. Left ventricular diastolic parameters were  normal.   2. Right ventricular systolic function is mildly reduced. The right  ventricular size is normal.   3. The mitral valve is normal in structure. Mild mitral valve  regurgitation.   4. The aortic valve has been repaired/replaced. Aortic valve  regurgitation is trivial.   5. Pulmonic valve regurgitation is moderate.   Assessment / Plan: 1. Status post mechanical aortic valve replacement.  Exam is stable. INR has been therapeutic.   Routine SBE prophylaxis. Echo in Jan showed normal valve function.  2. Status post CABG for ostial coronary disease. No angina. Myoview 03/20/16 was normal.  Continue current medical therapy. She is asymptomatic.   3. Hyperlipidemia.   On very low dose Crestor. Intolerant of multiple meds. Last LDL 87 is stable. Will continue current therapy.  4. Right bundle branch block/LAFB- chronic. She is asymptomatic.  5. HTN  elevated today but has been good at home. Monitor. Encourage increased aerobic activity and weight loss.    I will follow up in 6 months

## 2021-08-29 ENCOUNTER — Encounter: Payer: Self-pay | Admitting: Cardiology

## 2021-08-29 ENCOUNTER — Ambulatory Visit (INDEPENDENT_AMBULATORY_CARE_PROVIDER_SITE_OTHER): Payer: Medicare Other | Admitting: Cardiology

## 2021-08-29 VITALS — BP 151/82 | HR 57 | Ht 65.0 in | Wt 169.0 lb

## 2021-08-29 DIAGNOSIS — Z951 Presence of aortocoronary bypass graft: Secondary | ICD-10-CM | POA: Diagnosis not present

## 2021-08-29 DIAGNOSIS — E785 Hyperlipidemia, unspecified: Secondary | ICD-10-CM

## 2021-08-29 DIAGNOSIS — Z952 Presence of prosthetic heart valve: Secondary | ICD-10-CM

## 2021-08-29 NOTE — Patient Instructions (Signed)
Medication Instructions:    *If you need a refill on your cardiac medications before your next appointment, please call your pharmacy*   Lab Work: NOT NEEDED If you have labs (blood work) drawn today and your tests are completely normal, you will receive your results only by: MyChart Message (if you have MyChart) OR A paper copy in the mail If you have any lab test that is abnormal or we need to change your treatment, we will call you to review the results.   Testing/Procedures: NOT NEEDED   Follow-Up: At [facility], you and your health needs are our priority.  As part of our continuing mission to provide you with exceptional heart care, we have created designated Provider Care Teams.  These Care Teams include your primary Cardiologist (physician) and Advanced Practice Providers (APPs -  Physician Assistants and Nurse Practitioners) who all work together to provide you with the care you need, when you need it.     Your next appointment:   6 month(s)  The format for your next appointment:   In Person  Provider:   Peter Swaziland, MD    Other Instructions

## 2021-09-19 ENCOUNTER — Ambulatory Visit (INDEPENDENT_AMBULATORY_CARE_PROVIDER_SITE_OTHER): Payer: Medicare Other | Admitting: *Deleted

## 2021-09-19 DIAGNOSIS — Z7901 Long term (current) use of anticoagulants: Secondary | ICD-10-CM | POA: Diagnosis not present

## 2021-09-19 DIAGNOSIS — I359 Nonrheumatic aortic valve disorder, unspecified: Secondary | ICD-10-CM

## 2021-09-19 LAB — POCT INR: INR: 2.9 (ref 2.0–3.0)

## 2021-09-19 NOTE — Patient Instructions (Addendum)
Description   Continue taking 1 tablet daily except 1.5 tablets each Wednesday. Repeat INR in 6 weeks. [phone]

## 2021-09-30 ENCOUNTER — Other Ambulatory Visit: Payer: Self-pay | Admitting: Psychiatry

## 2021-09-30 DIAGNOSIS — F4001 Agoraphobia with panic disorder: Secondary | ICD-10-CM

## 2021-09-30 DIAGNOSIS — F338 Other recurrent depressive disorders: Secondary | ICD-10-CM

## 2021-10-05 ENCOUNTER — Other Ambulatory Visit: Payer: Self-pay | Admitting: Family Medicine

## 2021-10-05 DIAGNOSIS — E2839 Other primary ovarian failure: Secondary | ICD-10-CM

## 2021-10-22 ENCOUNTER — Other Ambulatory Visit: Payer: Self-pay | Admitting: Cardiology

## 2021-10-30 ENCOUNTER — Telehealth: Payer: Self-pay

## 2021-10-30 ENCOUNTER — Telehealth: Payer: Self-pay | Admitting: Cardiology

## 2021-10-30 ENCOUNTER — Other Ambulatory Visit: Payer: Self-pay | Admitting: Cardiology

## 2021-10-30 DIAGNOSIS — Z952 Presence of prosthetic heart valve: Secondary | ICD-10-CM

## 2021-10-30 NOTE — Telephone Encounter (Signed)
I spoke to patient and informed her of Warfarin manufacturer change.  Verbalized understanding.

## 2021-10-30 NOTE — Telephone Encounter (Signed)
Yes that is fine as long as patient knows medication may look slightly different

## 2021-10-30 NOTE — Telephone Encounter (Signed)
Returned call to [place] at [facility] advice given.

## 2021-10-30 NOTE — Telephone Encounter (Signed)
New Message:     Mary from [employer] [employer]. She wanted Dr Swaziland to know that they changed manufacture for patient's Warfarin.. She needs to know if this alright?    :    ar

## 2021-10-30 NOTE — Telephone Encounter (Signed)
Message sent to [facility] for advice. 

## 2021-10-31 ENCOUNTER — Ambulatory Visit (INDEPENDENT_AMBULATORY_CARE_PROVIDER_SITE_OTHER): Payer: Medicare Other

## 2021-10-31 DIAGNOSIS — Z7901 Long term (current) use of anticoagulants: Secondary | ICD-10-CM

## 2021-10-31 DIAGNOSIS — I359 Nonrheumatic aortic valve disorder, unspecified: Secondary | ICD-10-CM | POA: Diagnosis not present

## 2021-10-31 LAB — POCT INR: INR: 3.9 — AB (ref 2.0–3.0)

## 2021-10-31 NOTE — Patient Instructions (Signed)
Description   Skip today's dosage of Warfarin, then start taking 5mg  daily.  Repeat INR in 3 weeks. [phone]

## 2021-11-21 ENCOUNTER — Ambulatory Visit: Payer: Medicare Other | Attending: Cardiology

## 2021-11-21 DIAGNOSIS — Z7901 Long term (current) use of anticoagulants: Secondary | ICD-10-CM | POA: Diagnosis not present

## 2021-11-21 DIAGNOSIS — Z5181 Encounter for therapeutic drug level monitoring: Secondary | ICD-10-CM | POA: Diagnosis not present

## 2021-11-21 LAB — POCT INR: INR: 1.8 — AB (ref 2.0–3.0)

## 2021-11-21 NOTE — Patient Instructions (Addendum)
Description   START taking 5mg  daily EXCEPT 7.5mg  on Wednesdays.  Stay consistent with greens each week.  Repeat INR in 2 weeks. [facility] # [phone]

## 2021-12-04 ENCOUNTER — Ambulatory Visit: Payer: Medicare Other

## 2021-12-04 ENCOUNTER — Ambulatory Visit: Payer: Medicare Other | Attending: Cardiology

## 2021-12-04 DIAGNOSIS — I359 Nonrheumatic aortic valve disorder, unspecified: Secondary | ICD-10-CM

## 2021-12-04 DIAGNOSIS — Z5181 Encounter for therapeutic drug level monitoring: Secondary | ICD-10-CM

## 2021-12-04 DIAGNOSIS — Z7901 Long term (current) use of anticoagulants: Secondary | ICD-10-CM | POA: Diagnosis not present

## 2021-12-04 LAB — POCT INR: INR: 2.8 (ref 2.0–3.0)

## 2021-12-04 NOTE — Patient Instructions (Signed)
Continue 5mg  daily EXCEPT 7.5mg  on Wednesdays.  Stay consistent with greens each week.  Repeat INR in 4 weeks. [facility] # [phone]

## 2021-12-24 ENCOUNTER — Other Ambulatory Visit: Payer: Self-pay | Admitting: Cardiology

## 2022-01-01 ENCOUNTER — Ambulatory Visit: Payer: Medicare Other | Attending: Cardiology

## 2022-01-01 DIAGNOSIS — Z7901 Long term (current) use of anticoagulants: Secondary | ICD-10-CM | POA: Diagnosis not present

## 2022-01-01 LAB — POCT INR: INR: 2.7 (ref 2.0–3.0)

## 2022-01-01 NOTE — Patient Instructions (Signed)
Continue 5mg daily EXCEPT 7.5mg on Wednesdays.  Stay consistent with greens each week.  Repeat INR in 6 weeks. [facility] # [phone] 

## 2022-01-09 ENCOUNTER — Other Ambulatory Visit: Payer: Self-pay | Admitting: Psychiatry

## 2022-01-09 DIAGNOSIS — F338 Other recurrent depressive disorders: Secondary | ICD-10-CM

## 2022-01-09 DIAGNOSIS — F4001 Agoraphobia with panic disorder: Secondary | ICD-10-CM

## 2022-02-12 ENCOUNTER — Ambulatory Visit: Payer: Medicare Other | Attending: Cardiology

## 2022-02-12 ENCOUNTER — Ambulatory Visit: Payer: Medicare Other

## 2022-02-12 DIAGNOSIS — Z7901 Long term (current) use of anticoagulants: Secondary | ICD-10-CM | POA: Diagnosis not present

## 2022-02-12 DIAGNOSIS — I359 Nonrheumatic aortic valve disorder, unspecified: Secondary | ICD-10-CM

## 2022-02-12 LAB — POCT INR: INR: 2.7 (ref 2.0–3.0)

## 2022-02-12 NOTE — Progress Notes (Signed)
Tamara Patterson Date of Birth: Dec 23, 1940   History of Present Illness: Tamara Patterson is seen today for followup. 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 This included mechanical aortic valve replacement and grafting of the aortic root with a #23 mm St. Jude valve conduit. She also had coronary bypass surgery including an IMA graft to the LAD, saphenous vein graft to the right coronary, and saphenous vein graft to the first and second obtuse marginal vessels. 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 She was treated with pulse steroids for a period of time and was weaned off of this. Last Myoview study was in January 2018 and was normal. Echo in 2011 was satisfactory.  She has been intolerant of statins and Zetia due to myalgias. Also intolerant of Welchol due to hypoglycemia.  Echo in Jan 2023 showed normal LV function and normal prosthetic AV function.  On follow up today she is doing well. Energy level is good.  No chest pain, dyspnea, palpitations, dizziness.  No edema. No bleeding. BP at home has been well controlled less than 135 systolic. She has not been as active  but notices more problems with arthritis.   Current Outpatient Medications on File Prior to Visit  Medication Sig Dispense Refill   amLODipine (NORVASC) 2.5 MG tablet Take 1 tablet (2.5 mg total) by mouth daily. 90 tablet 0   amoxicillin (AMOXIL) 500 MG capsule Take 2000 mg (4 capsules) 30-60 min prior to dental procedures. 4 capsule 3   Barberry-Oreg Grape-Goldenseal (BERBERINE COMPLEX PO) Take 2 tablets by mouth daily.     Cholecalciferol (VITAMIN D3) 2000 units TABS Take 2,000 Units by mouth daily.     clobetasol ointment (TEMOVATE) 0.05 % clobetasol 0.05 % topical ointment  APPLY OINTMENT TOPICALLY TO AFFECTED AREA TWICE DAILY     escitalopram (LEXAPRO) 10 MG tablet Take 2 tablets by mouth once daily 180 tablet 0   neomycin-polymyxin  b-dexamethasone (MAXITROL) 3.5-10000-0.1 OINT      RESTASIS 0.05 % ophthalmic emulsion 1 drop as directed.     rosuvastatin (CRESTOR) 5 MG tablet Take 5 mg by mouth daily. Take one tablet twice a week.     warfarin (COUMADIN) 5 MG tablet TAKE 1 TO 1 AND 1/2 TABLETS BY MOUTH ONCE DAILY AS DIRECTED BY COUMADIN CLINIC 120 tablet 1   No current facility-administered medications on file prior to visit.    Allergies  Allergen Reactions   Codeine Nausea Only   Lipitor [Atorvastatin Calcium] Other (See Comments)    Caused muscle aching   Atenolol Other (See Comments)    cramps    Past Medical History:  Diagnosis Date   Anticoagulant long-term use    CAD (coronary artery disease)    Depression    seasonal   Dyslipidemia    GERD (gastroesophageal reflux disease)    Giant cell arteritis (HCC)    per aortotomy biopsy in 1991   HTN (hypertension)    Hx of CABG 1991   LIMA-LAD, SVG-RCA, SVG-1st and 2ndOM   Panic anxiety syndrome    S/P AVR (aortic valve replacement)    with root grafting; #51mm  St. Jude valve conduit; Last Echo 2007    Past Surgical History:  Procedure Laterality Date   AORTIC VALVE REPLACEMENT     with root grafting   CARDIAC SURGERY     CHOLECYSTECTOMY     CORONARY ARTERY BYPASS GRAFT  Social History   Tobacco Use  Smoking Status Former  Smokeless Tobacco Never    Social History   Substance and Sexual Activity  Alcohol Use No    Family History  Problem Relation Age of Onset   Liver disease Mother    Pancreatic cancer Father     Review of Systems: As noted in history of present illness. All other systems were reviewed and are negative.  Physical Exam: BP (!) 143/80   Pulse 68   Ht 5' 5.5" (1.664 m)   Wt 165 lb (74.8 kg)   SpO2 98%   BMI 27.04 kg/m  GENERAL:  Well appearing BF in NAD HEENT:  PERRL, EOMI, sclera are clear. Oropharynx is clear. NECK:  No jugular venous distention, carotid upstroke brisk and symmetric, no bruits, no  thyromegaly or adenopathy LUNGS:  Clear to auscultation bilaterally CHEST:  Unremarkable HEART:  RRR,  PMI not displaced or sustained,good mechanical AV click, no S3, no S4: no clicks, no rubs, no murmurs ABD:  Soft, nontender. BS +, no masses or bruits. No hepatomegaly, no splenomegaly EXT:  2 + pulses throughout, no edema, no cyanosis no clubbing SKIN:  Warm and dry.  No rashes NEURO:  Alert and oriented x 3. Cranial nerves II through XII intact. PSYCH:  Cognitively intact    LABORATORY DATA:  Lab Results  Component Value Date   WBC 5.6 12/25/2020   HGB 13.1 12/25/2020   HCT 40.0 12/25/2020   PLT 232 12/25/2020   GLUCOSE 94 12/25/2020   CHOL 161 12/25/2020   TRIG 141 12/25/2020   HDL 49 12/25/2020   LDLDIRECT 126.4 10/07/2012   LDLCALC 87 12/25/2020   ALT 15 12/25/2020   AST 21 12/25/2020   NA 143 12/25/2020   K 4.4 12/25/2020   CL 107 (H) 12/25/2020   CREATININE 0.86 12/25/2020   BUN 12 12/25/2020   CO2 24 12/25/2020   INR 2.7 02/12/2022   HGBA1C 5.6 11/17/2019   Labs from primary care dated 05/02/15: normal CMET Dated 05/25/15: A1c5.8% Dated 11/10/15: cholesterol 177, triglycerides 91, LDL 110, HDL 49. Dated 05/17/19: glucose 114. A1c 5.6%. BMET otherwise normal. Dated 03/14/20: normal BMET  Ecg not done today    Myoview 03/20/16: Study Highlights     The left ventricular ejection fraction is normal (55-65%). Nuclear stress EF: 64%. There was no ST segment deviation noted during stress. Blood pressure demonstrated a normal response to exercise.   Normal study, no evidence for ischemia or infarction   Echo 03/29/21: IMPRESSIONS     1. Left ventricular ejection fraction, by estimation, is 60 to 65%. The  left ventricle has normal function. The left ventricle has no regional  wall motion abnormalities. Left ventricular diastolic parameters were  normal.   2. Right ventricular systolic function is mildly reduced. The right  ventricular size is normal.   3. The  mitral valve is normal in structure. Mild mitral valve  regurgitation.   4. The aortic valve has been repaired/replaced. Aortic valve  regurgitation is trivial.   5. Pulmonic valve regurgitation is moderate.   Assessment / Plan: 1. Status post mechanical aortic valve replacement.  Exam is stable. INR has been therapeutic.   Routine SBE prophylaxis. Echo in Jan showed normal valve function.  2. Status post CABG for ostial coronary disease. No angina. Myoview 03/20/16 was normal. Continue current medical therapy. She is asymptomatic.   3. Hyperlipidemia.   On very low dose Crestor. Intolerant of multiple meds. Last LDL 87  is stable. Will continue current therapy.  4. Right bundle branch block/LAFB- chronic. She is asymptomatic.  5. HTN  elevated mildy  today but has been good at home. Monitor. Encourage increased aerobic activity and weight loss.   Will update lab work today  I will follow up in 6 months

## 2022-02-12 NOTE — Patient Instructions (Signed)
Continue 5mg daily EXCEPT 7.5mg on Wednesdays.  Stay consistent with greens each week.  Repeat INR in 6 weeks. [facility] # [phone] 

## 2022-02-15 ENCOUNTER — Ambulatory Visit: Payer: Medicare Other | Attending: Cardiology | Admitting: Cardiology

## 2022-02-15 ENCOUNTER — Encounter: Payer: Self-pay | Admitting: Cardiology

## 2022-02-15 VITALS — BP 143/80 | HR 68 | Ht 65.5 in | Wt 165.0 lb

## 2022-02-15 DIAGNOSIS — E785 Hyperlipidemia, unspecified: Secondary | ICD-10-CM | POA: Diagnosis not present

## 2022-02-15 DIAGNOSIS — Z952 Presence of prosthetic heart valve: Secondary | ICD-10-CM | POA: Diagnosis not present

## 2022-02-15 DIAGNOSIS — I10 Essential (primary) hypertension: Secondary | ICD-10-CM

## 2022-02-15 DIAGNOSIS — Z7901 Long term (current) use of anticoagulants: Secondary | ICD-10-CM

## 2022-02-15 DIAGNOSIS — Z951 Presence of aortocoronary bypass graft: Secondary | ICD-10-CM | POA: Diagnosis not present

## 2022-02-15 NOTE — Addendum Note (Signed)
Addended by: Neoma Laming on: 02/15/2022 10:02 AM   Modules accepted: Orders

## 2022-02-16 LAB — LIPID PANEL
Chol/HDL Ratio: 3.2 ratio (ref 0.0–4.4)
Cholesterol, Total: 168 mg/dL (ref 100–199)
HDL: 53 mg/dL (ref >39)
LDL Chol Calc (NIH): 93 mg/dL (ref 0–99)
Triglycerides: 127 mg/dL (ref 0–149)
VLDL Cholesterol Cal: 22 mg/dL (ref 5–40)

## 2022-02-16 LAB — CBC WITH DIFFERENTIAL/PLATELET
Basophils Absolute: 0.1 10*3/uL (ref 0.0–0.2)
Basos: 1 %
EOS (ABSOLUTE): 0.1 10*3/uL (ref 0.0–0.4)
Eos: 2 %
Hematocrit: 41.7 % (ref 34.0–46.6)
Hemoglobin: 13.5 g/dL (ref 11.1–15.9)
Immature Grans (Abs): 0 10*3/uL (ref 0.0–0.1)
Immature Granulocytes: 0 %
Lymphocytes Absolute: 2.3 10*3/uL (ref 0.7–3.1)
Lymphs: 43 %
MCH: 27.2 pg (ref 26.6–33.0)
MCHC: 32.4 g/dL (ref 31.5–35.7)
MCV: 84 fL (ref 79–97)
Monocytes Absolute: 0.7 10*3/uL (ref 0.1–0.9)
Monocytes: 14 %
Neutrophils Absolute: 2.2 10*3/uL (ref 1.4–7.0)
Neutrophils: 40 %
Platelets: 236 10*3/uL (ref 150–450)
RBC: 4.96 x10E6/uL (ref 3.77–5.28)
RDW: 13.1 % (ref 11.7–15.4)
WBC: 5.4 10*3/uL (ref 3.4–10.8)

## 2022-02-16 LAB — HEMOGLOBIN A1C
Est. average glucose Bld gHb Est-mCnc: 123 mg/dL
Hgb A1c MFr Bld: 5.9 % — ABNORMAL HIGH (ref 4.8–5.6)

## 2022-02-16 LAB — COMPREHENSIVE METABOLIC PANEL
ALT: 28 IU/L (ref 0–32)
AST: 25 IU/L (ref 0–40)
Albumin/Globulin Ratio: 2.2 (ref 1.2–2.2)
Albumin: 4.6 g/dL (ref 3.7–4.7)
Alkaline Phosphatase: 117 IU/L (ref 44–121)
BUN/Creatinine Ratio: 18 (ref 12–28)
BUN: 17 mg/dL (ref 8–27)
Bilirubin Total: 0.6 mg/dL (ref 0.0–1.2)
CO2: 25 mmol/L (ref 20–29)
Calcium: 9.4 mg/dL (ref 8.7–10.3)
Chloride: 104 mmol/L (ref 96–106)
Creatinine, Ser: 0.96 mg/dL (ref 0.57–1.00)
Globulin, Total: 2.1 g/dL (ref 1.5–4.5)
Glucose: 96 mg/dL (ref 70–99)
Potassium: 5.1 mmol/L (ref 3.5–5.2)
Sodium: 142 mmol/L (ref 134–144)
Total Protein: 6.7 g/dL (ref 6.0–8.5)
eGFR: 59 mL/min/{1.73_m2} — ABNORMAL LOW (ref >59)

## 2022-03-19 ENCOUNTER — Inpatient Hospital Stay: Admission: RE | Admit: 2022-03-19 | Payer: Medicare Other | Source: Ambulatory Visit

## 2022-03-21 ENCOUNTER — Ambulatory Visit: Payer: Medicare Other | Admitting: Psychiatry

## 2022-03-24 ENCOUNTER — Other Ambulatory Visit: Payer: Self-pay | Admitting: Cardiology

## 2022-03-26 ENCOUNTER — Ambulatory Visit: Payer: Medicare Other

## 2022-03-28 ENCOUNTER — Ambulatory Visit: Payer: Medicare Other

## 2022-04-01 ENCOUNTER — Ambulatory Visit: Payer: Medicare Other | Attending: Cardiology

## 2022-04-01 ENCOUNTER — Other Ambulatory Visit: Payer: Self-pay | Admitting: Cardiology

## 2022-04-01 DIAGNOSIS — Z7901 Long term (current) use of anticoagulants: Secondary | ICD-10-CM

## 2022-04-01 DIAGNOSIS — Z5181 Encounter for therapeutic drug level monitoring: Secondary | ICD-10-CM | POA: Diagnosis not present

## 2022-04-01 DIAGNOSIS — I359 Nonrheumatic aortic valve disorder, unspecified: Secondary | ICD-10-CM

## 2022-04-01 DIAGNOSIS — Z952 Presence of prosthetic heart valve: Secondary | ICD-10-CM

## 2022-04-01 LAB — POCT INR: INR: 2.3 (ref 2.0–3.0)

## 2022-04-01 NOTE — Patient Instructions (Signed)
Continue 5mg  daily EXCEPT 7.5mg  on Wednesdays.  Stay consistent with greens each week.  Repeat INR in 6 weeks. [facility] # [phone]

## 2022-04-11 ENCOUNTER — Other Ambulatory Visit: Payer: Self-pay | Admitting: Psychiatry

## 2022-04-11 DIAGNOSIS — F338 Other recurrent depressive disorders: Secondary | ICD-10-CM

## 2022-04-11 DIAGNOSIS — F4001 Agoraphobia with panic disorder: Secondary | ICD-10-CM

## 2022-05-13 ENCOUNTER — Ambulatory Visit: Payer: Medicare Other | Attending: Cardiology

## 2022-05-13 DIAGNOSIS — Z7901 Long term (current) use of anticoagulants: Secondary | ICD-10-CM

## 2022-05-13 DIAGNOSIS — Z5181 Encounter for therapeutic drug level monitoring: Secondary | ICD-10-CM | POA: Diagnosis not present

## 2022-05-13 LAB — POCT INR: INR: 2 (ref 2.0–3.0)

## 2022-05-13 NOTE — Patient Instructions (Signed)
Description   Take 1.5 tablets today and then continue '5mg'$  daily EXCEPT 7.'5mg'$  on Wednesdays.  Stay consistent with greens each week.  Repeat INR in 6 weeks. [facility] # [phone]

## 2022-05-15 ENCOUNTER — Encounter: Payer: Self-pay | Admitting: Psychiatry

## 2022-05-15 ENCOUNTER — Ambulatory Visit (INDEPENDENT_AMBULATORY_CARE_PROVIDER_SITE_OTHER): Payer: Medicare Other | Admitting: Psychiatry

## 2022-05-15 DIAGNOSIS — F4001 Agoraphobia with panic disorder: Secondary | ICD-10-CM

## 2022-05-15 DIAGNOSIS — F338 Other recurrent depressive disorders: Secondary | ICD-10-CM | POA: Diagnosis not present

## 2022-05-15 MED ORDER — ESCITALOPRAM OXALATE 20 MG PO TABS: 20.0000 mg | ORAL_TABLET | Freq: Every day | ORAL | 3 refills | Status: DC

## 2022-05-15 NOTE — Progress Notes (Signed)
Tamara Patterson:[id number] 01-Jul-1940 82 y.o.    Subjective:   Patient ID:  Tamara Patterson is a 82 y.o. (DOB [birth year]) female.  Chief Complaint:  Chief Complaint  Patient presents with   Follow-up    HPI STEPHAINE SANDIDGE presents to the office today for follow-up of panic disorder with agoraphobia, seasonal depression, and history of a fear of disease or phobia of disease.  seen March 2019 & 02/2019.  No meds were changed.  She was can encouraged to use her light box in the winter for seasonal depression and to maintain adequate levels of vitamin D.  03/21/2020 appt with following noted: Remains on Lexapro 20 mg daily and no other psych meds. Done very well with mood and anxiety.  Still isolated.  No panic. Little tics or tremors in mouth.  Sort of like a jerk.  Not really bothering her.  Had for a couple of years and comes and goes and read about it. Plan: use lightbox Continue Lexapro 20 mg daily vs reduce the dosage bc tics and tremors.  Rec not reduce until Spring.   April reduce to 15 mg for a couple of months to see if this is better.  Disc risk relapse.  They may not be related.  Rec she discussed this with her primary care doc when she sees the doc soon.  It is not typical for SSRIs to cause tremors or tics but it is not impossible.  12/21/2020 appt noted: Stayed on Lexapro 20 mg daily.  Tremor are not real bad but bothersome at times in hands. Moood good so far bbut seasonal downturn likely.  Then lightbox usually helps.  No panic or unusual anxiety. No SE Joined fitness center and doing yoga Patient reports stable mood and denies depressed or irritable moods.  Patient denies any recent difficulty with anxiety.  Patient denies difficulty with sleep initiation or maintenance. Denies appetite disturbance.  Patient reports that energy and motivation have been good.  Patient denies any difficulty with concentration.  Patient denies any suicidal ideation.  06/28/21 appt noted: Fine  without panic. Still tremor.  Can affect handwriting.  Otherwise not that much of a problem.   Didn't need lightbox this winter and without depression. Hx seasonal dep worse Jan.  05/15/22 appt noted: Doing ok.  Very mild seasonal dep. No panic or anxiety. Didn't need light box this winter. Sleeping good and appetitie good. Tremor seems worse to her.  Not disc with PCP. No caffeine.   Exercises.    Past psych med trials include citalopram 40, Lexapro 20, light box, trazodone no response, mirtazapine  Review of Systems:  Review of Systems  Musculoskeletal:  Positive for arthralgias. Negative for joint swelling.  Neurological:  Positive for tremors.    Medications: I have reviewed the patient's current medications.  Current Outpatient Medications  Medication Sig Dispense Refill   amLODipine (NORVASC) 2.5 MG tablet Take 1 tablet by mouth once daily 90 tablet 0   Barberry-Oreg Grape-Goldenseal (BERBERINE COMPLEX PO) Take 2 tablets by mouth daily.     Cholecalciferol (VITAMIN D3) 2000 units TABS Take 2,000 Units by mouth daily.     clobetasol ointment (TEMOVATE) 0.05 % clobetasol 0.05 % topical ointment  APPLY OINTMENT TOPICALLY TO AFFECTED AREA TWICE DAILY     neomycin-polymyxin b-dexamethasone (MAXITROL) 3.5-10000-0.1 OINT      RESTASIS 0.05 % ophthalmic emulsion 1 drop as directed.     rosuvastatin (CRESTOR) 5 MG tablet Take 5 mg by mouth daily.  Take one tablet twice a week.     warfarin (COUMADIN) 5 MG tablet TAKE 1 TO 1 AND 1/2 TABLET BY MOUTH ONCE DAILY AS DIRECTED BY COUMADIN CLINIC 120 tablet 0   amoxicillin (AMOXIL) 500 MG capsule Take 2000 mg (4 capsules) 30-60 min prior to dental procedures. (Patient not taking: Reported on 05/15/2022) 4 capsule 3   escitalopram (LEXAPRO) 20 MG tablet Take 1 tablet (20 mg total) by mouth daily. 90 tablet 3   No current facility-administered medications for this visit.    Medication Side Effects: None  Allergies:  Allergies  Allergen  Reactions   Codeine Nausea Only   Lipitor [Atorvastatin Calcium] Other (See Comments)    Caused muscle aching   Atenolol Other (See Comments)    cramps    Past Medical History:  Diagnosis Date   Anticoagulant long-term use    CAD (coronary artery disease)    Depression    seasonal   Dyslipidemia    GERD (gastroesophageal reflux disease)    Giant cell arteritis (HCC)    per aortotomy biopsy in 1991   HTN (hypertension)    Hx of CABG 1991   LIMA-LAD, SVG-RCA, SVG-1st and 2ndOM   Panic anxiety syndrome    S/P AVR (aortic valve replacement)    with root grafting; #40m  St. 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    Comment: retired  Tobacco Use   Smoking status: Former   Smokeless tobacco: Never  VScientific laboratory technicianUse: Never used  Substance and Sexual Activity   Alcohol use: No   Drug use: No   Sexual activity: Not on file  Other Topics Concern   Not on file  Social History Narrative   Not on file   Social Determinants of Health   Financial Resource Strain: Not on file  Food Insecurity: Not on file  Transportation Needs: Not on file  Physical Activity: Not on file  Stress: Not on file  Social Connections: Not on file  Intimate Partner Violence: Not on file    Past Medical History, Surgical history, Social history, and Family history were reviewed and updated as appropriate.   Please see review of systems for further details on the patient's review from today.   Objective:   Physical Exam:  There were no vitals taken for this visit.  Physical Exam Constitutional:      General: She is not  in acute distress. Musculoskeletal:        General: No deformity.  Neurological:     Mental Status: She is alert and oriented to person, place, and time.     Cranial Nerves: No dysarthria.     Coordination: Coordination normal.     Comments: Fine intention tremor  Psychiatric:        Attention and Perception: Attention and perception normal. She does not perceive auditory or visual hallucinations.        Mood and Affect: Mood normal. Mood is not anxious or depressed. Affect is not labile, blunt, angry or inappropriate.  Speech: Speech normal.        Behavior: Behavior normal. Behavior is cooperative.        Thought Content: Thought content normal. Thought content is not paranoid or delusional. Thought content does not include homicidal or suicidal ideation. Thought content does not include suicidal plan.        Cognition and Memory: Cognition and memory normal.        Judgment: Judgment normal.     Comments: Anxiety managed.     Lab Review:     Component Value Date/Time   NA 142 02/15/2022 1017   K 5.1 02/15/2022 1017   CL 104 02/15/2022 1017   CO2 25 02/15/2022 1017   GLUCOSE 96 02/15/2022 1017   GLUCOSE 111 (H) 10/10/2016 0828   BUN 17 02/15/2022 1017   CREATININE 0.96 02/15/2022 1017   CREATININE 0.82 05/02/2015 1020   CALCIUM 9.4 02/15/2022 1017   PROT 6.7 02/15/2022 1017   ALBUMIN 4.6 02/15/2022 1017   AST 25 02/15/2022 1017   ALT 28 02/15/2022 1017   ALKPHOS 117 02/15/2022 1017   BILITOT 0.6 02/15/2022 1017   GFRNONAA 67 11/17/2019 0907   GFRAA 77 11/17/2019 0907       Component Value Date/Time   WBC 5.4 02/15/2022 1017   WBC 5.5 10/10/2016 0828   RBC 4.96 02/15/2022 1017   RBC 5.23 (H) 10/10/2016 0828   HGB 13.5 02/15/2022 1017   HCT 41.7 02/15/2022 1017   PLT 236 02/15/2022 1017   MCV 84 02/15/2022 1017   MCH 27.2 02/15/2022 1017   MCH 26.4 10/10/2016 0828   MCHC 32.4 02/15/2022 1017   MCHC 32.4 10/10/2016 0828   RDW 13.1 02/15/2022 1017    LYMPHSABS 2.3 02/15/2022 1017   MONOABS 0.7 04/13/2013 1057   EOSABS 0.1 02/15/2022 1017   BASOSABS 0.1 02/15/2022 1017    No results found for: "POCLITH", "LITHIUM"   No results found for: "PHENYTOIN", "PHENOBARB", "VALPROATE", "CBMZ"   .res Assessment: Plan:    Melisse was seen today for follow-up.  Diagnoses and all orders for this visit:  Seasonal depression ([place]) -     escitalopram (LEXAPRO) 20 MG tablet; Take 1 tablet (20 mg total) by mouth daily.  Panic disorder with agoraphobia -     escitalopram (LEXAPRO) 20 MG tablet; Take 1 tablet (20 mg total) by mouth daily.     Greater than 50% of 30 min face to face time with patient was spent on counseling and coordination of care. We discussed dxes and seasonality and timing of meds and relapse risk without and with meds.  Disc SE in detail and SSRI withdrawal sx.  option light therapy in Nov if worsening seasonal depression.  Worked well for her in the past.  Replace bulbs.   Discussed the limited lifetime usefulness of fluorescent bulbs in light box is used to treat seasonal affective disorder.  She has never replaced her light box bulbs She has not needed to use lightbox in last couple of years.  Continue Lexapro 20 mg daily vs reduce the dosage bc tics and tremors.     Disc risk relapse.  They may not be related.  Rec she discussed this with her primary care doc when she sees the doc soon.  It is not typical for SSRIs to cause tremors or tics but it is not impossible.  Option propranolol prn tremor.    She agrees.   FU 12 mos  Lynder Parents, MD, DFAPA    Please  see After Visit Summary for patient specific instructions.  Future Appointments  Date Time Provider [place]  06/24/2022 10:15 AM [facility] None  09/03/2022  9:30 AM [facility] DX DEXA 1 [facility]     No orders of the defined types were placed in this encounter.   -------------------------------

## 2022-06-24 ENCOUNTER — Other Ambulatory Visit: Payer: Self-pay | Admitting: Cardiology

## 2022-06-24 ENCOUNTER — Ambulatory Visit: Payer: Medicare Other | Attending: Cardiology | Admitting: *Deleted

## 2022-06-24 DIAGNOSIS — I359 Nonrheumatic aortic valve disorder, unspecified: Secondary | ICD-10-CM

## 2022-06-24 DIAGNOSIS — Z952 Presence of prosthetic heart valve: Secondary | ICD-10-CM

## 2022-06-24 DIAGNOSIS — Z7901 Long term (current) use of anticoagulants: Secondary | ICD-10-CM

## 2022-06-24 LAB — POCT INR: POC INR: 2.1

## 2022-06-24 NOTE — Telephone Encounter (Signed)
Prescription refill request received for warfarin [place]: [place], 02/15/2022 Next INR check: 5/20 Warfarin tablet strength: 5mg 

## 2022-06-24 NOTE — Patient Instructions (Signed)
Description   Continue taking 5mg  daily EXCEPT 7.5mg  on Wednesdays.  Stay consistent with greens each week.  Repeat INR in 6 weeks. [facility] # [phone]

## 2022-07-03 ENCOUNTER — Other Ambulatory Visit: Payer: Self-pay | Admitting: Cardiology

## 2022-08-05 ENCOUNTER — Ambulatory Visit: Payer: Medicare Other

## 2022-08-08 ENCOUNTER — Ambulatory Visit: Payer: Medicare Other | Attending: Cardiology | Admitting: *Deleted

## 2022-08-08 DIAGNOSIS — Z7901 Long term (current) use of anticoagulants: Secondary | ICD-10-CM

## 2022-08-08 DIAGNOSIS — Z5181 Encounter for therapeutic drug level monitoring: Secondary | ICD-10-CM

## 2022-08-08 DIAGNOSIS — I359 Nonrheumatic aortic valve disorder, unspecified: Secondary | ICD-10-CM | POA: Diagnosis not present

## 2022-08-08 LAB — POCT INR: POC INR: 1.7

## 2022-08-08 NOTE — Patient Instructions (Signed)
Description   Take 1.5 tablets of warfarin today and then continue taking 5mg  daily EXCEPT 7.5mg  on Wednesdays.  Stay consistent with greens each week.  Repeat INR in 3 weeks. [facility] # [phone]

## 2022-08-29 ENCOUNTER — Ambulatory Visit: Payer: Medicare Other | Attending: Internal Medicine | Admitting: *Deleted

## 2022-08-29 DIAGNOSIS — Z7901 Long term (current) use of anticoagulants: Secondary | ICD-10-CM

## 2022-08-29 DIAGNOSIS — I359 Nonrheumatic aortic valve disorder, unspecified: Secondary | ICD-10-CM | POA: Diagnosis not present

## 2022-08-29 DIAGNOSIS — Z5181 Encounter for therapeutic drug level monitoring: Secondary | ICD-10-CM | POA: Diagnosis not present

## 2022-08-29 LAB — POCT INR: POC INR: 1.4

## 2022-08-29 NOTE — Patient Instructions (Addendum)
Description   Take 7.5 mg of warfarin today and then START taking 5mg  daily EXCEPT 7.5mg  on Wednesdays and Fridays.  Stay consistent with greens each week.  Repeat INR in 1 week. [facility] # [phone]

## 2022-09-03 ENCOUNTER — Inpatient Hospital Stay: Admission: RE | Admit: 2022-09-03 | Payer: Medicare Other | Source: Ambulatory Visit

## 2022-09-09 ENCOUNTER — Ambulatory Visit: Payer: Medicare Other

## 2022-09-12 ENCOUNTER — Ambulatory Visit: Payer: Medicare Other | Attending: Cardiology | Admitting: *Deleted

## 2022-09-12 DIAGNOSIS — I359 Nonrheumatic aortic valve disorder, unspecified: Secondary | ICD-10-CM | POA: Diagnosis not present

## 2022-09-12 DIAGNOSIS — Z7901 Long term (current) use of anticoagulants: Secondary | ICD-10-CM

## 2022-09-12 LAB — POCT INR: INR: 1.7 — AB (ref 2.0–3.0)

## 2022-09-12 NOTE — Patient Instructions (Addendum)
Description   Take 7.5 mg of warfarin today and then START taking 5mg  daily EXCEPT 7.5mg  on Mondays, Wednesdays and Fridays.  Stay consistent with greens each week.  Repeat INR in 2 weeks. [facility] # [phone]

## 2022-09-16 ENCOUNTER — Other Ambulatory Visit: Payer: Self-pay | Admitting: Cardiology

## 2022-09-16 DIAGNOSIS — Z952 Presence of prosthetic heart valve: Secondary | ICD-10-CM

## 2022-09-26 ENCOUNTER — Ambulatory Visit: Payer: Medicare Other | Attending: Cardiology

## 2022-09-26 DIAGNOSIS — I359 Nonrheumatic aortic valve disorder, unspecified: Secondary | ICD-10-CM | POA: Diagnosis not present

## 2022-09-26 DIAGNOSIS — Z7901 Long term (current) use of anticoagulants: Secondary | ICD-10-CM

## 2022-09-26 LAB — POCT INR: INR: 2.5 (ref 2.0–3.0)

## 2022-09-26 NOTE — Patient Instructions (Addendum)
Description   Continue taking 5mg  daily EXCEPT 7.5mg  on Mondays, Wednesdays and Fridays.  Stay consistent with greens each week.  Repeat INR in 3 weeks. [facility] # [phone]

## 2022-10-17 ENCOUNTER — Ambulatory Visit: Payer: Medicare Other | Attending: Cardiology | Admitting: *Deleted

## 2022-10-17 DIAGNOSIS — Z7901 Long term (current) use of anticoagulants: Secondary | ICD-10-CM

## 2022-10-17 DIAGNOSIS — I359 Nonrheumatic aortic valve disorder, unspecified: Secondary | ICD-10-CM

## 2022-10-17 LAB — POCT INR: INR: 2.4 (ref 2.0–3.0)

## 2022-10-17 NOTE — Patient Instructions (Signed)
Description   Continue taking 5mg  daily EXCEPT 7.5mg  on Mondays, Wednesdays and Fridays.  Stay consistent with greens each week.  Repeat INR in 4 weeks. [facility] # [phone]

## 2022-11-13 ENCOUNTER — Ambulatory Visit: Payer: Medicare Other | Attending: Cardiology | Admitting: *Deleted

## 2022-11-13 DIAGNOSIS — I359 Nonrheumatic aortic valve disorder, unspecified: Secondary | ICD-10-CM | POA: Diagnosis not present

## 2022-11-13 DIAGNOSIS — Z7901 Long term (current) use of anticoagulants: Secondary | ICD-10-CM | POA: Diagnosis not present

## 2022-11-13 LAB — POCT INR: INR: 2.8 (ref 2.0–3.0)

## 2022-11-13 NOTE — Patient Instructions (Signed)
Description   Continue taking 5mg  daily EXCEPT 7.5mg  on Mondays, Wednesdays and Fridays.  Stay consistent with greens each week.  Repeat INR in 5 weeks. [facility] # [phone]

## 2022-11-14 NOTE — Progress Notes (Signed)
Cardiology Office Note:  .   Date:  11/15/2022  ID:  Tamara Patterson, DOB November 12, 1940, MRN [id number] PCP: Deatra James, MD  [facility] Providers Cardiologist:  Peter Swaziland, MD    History of Present Illness: .   Tamara Patterson is a 82 y.o. female with history of thoracic aortic aneurysm CAD s/p 1991 mechanical aortic valve replacement, grafting of aortic root, CABG (LIMA-LAD, SVG-RCA, SVG-first and second obtuse marginal vessels), HLD, RBBB, LAFB.Marland Kitchen  At time of open heart surgery biopsy indicated giant cell arteritis treated with pulse steroids which were later weaned.   Lipid control's been difficult with intolerance to statins, Zetia due to myalgias and WelChol due to hypoglycemia.  Myoview 03/2016 low risk study.  Echo 03/2021 normal LVEF, normal mechanical aortic valve function.  Last seen 02/15/2022 by Dr. Swaziland doing well with no changes made. 02/2022 total cholesterol 168, triglyceride 127, HDL 53, LDL 93 recommended to continue Crestor 5mg  twice per week due to multiple prior intolerances.   Presents today for follow up. Her blood pressure was slightly elevated today, 146/70 with recheck 140/78. She has not eaten this morning and she has not taken her medications yet. She generally takes her medications in the evening. She has been checking her blood pressure at home and it is usually 120s-130s/70s-80s. Occasionally she will have a reading in the 160s, but this is only when she is stressed. She denies symptoms with this. Her heart rate was 56 today. She states that it has been this way for several years. She denies dizziness and lightheadedness. She has not been taking her rosuvastatin for the last several months because she states that she was concerned about muscle aches. After being off the medication, she has not noticed that the myalgias are any better. She has arthritis and attributes the symptoms to this. She is willing to restart it twice per week. She has concerns regarding valves  on her echocardiogram from 2023. She was also concerned about her hemoglobin A1C being elevated. Reports no shortness of breath nor dyspnea on exertion. Reports no chest pain, pressure, or tightness. No edema, orthopnea, PND. Reports no palpitations.    ROS: Please see the history of present illness.    All other systems reviewed and are negative.   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  Nuclear stress EF: 64%.  There was no ST segment deviation noted during stress.  Blood pressure demonstrated a normal response to exercise.  Normal study, no evidence for ischemia or infarction.   ECHOCARDIOGRAM  ECHOCARDIOGRAM COMPLETE 03/29/2021  Narrative ECHOCARDIOGRAM REPORT    Patient Name:   Tamara Patterson  Date of Exam: 03/29/2021 Medical Rec #:  [id number]     Height:       65.5 in Accession #:    [id number]    Weight:       165.0 lb Date of Birth:  Jan 29, 1941    BSA:          1.833 m  Patient Age:    80 years      BP:           136/74 mmHg Patient Gender: F             HR:           59 bpm. Exam Location:  [address]  Procedure: 2D Echo, Cardiac Doppler and Color Doppler  Indications:    R79.89 Elevated brain natriuretic peptide  History:        Patient has prior history of Echocardiogram examinations, most recent 07/17/2009. Prior CABG; Risk Factors:Hypertension and Dyslipidemia. Palpitations. Aortic valve disorder. S/P St. Jude aortic valve replacement.  Sonographer:     Cathie Beams RCS Referring Phys: [phone] PETER M Swaziland  IMPRESSIONS   1. Left ventricular ejection fraction, by estimation, is 60 to 65%. The left ventricle has normal function. The left ventricle has no regional wall motion abnormalities. Left ventricular diastolic parameters were normal. 2. Right ventricular systolic function is mildly reduced. The right ventricular size is normal. 3. The mitral valve is normal in structure. Mild mitral valve regurgitation. 4. The aortic valve has been repaired/replaced. Aortic valve regurgitation is trivial. 5. Pulmonic valve regurgitation is moderate.  FINDINGS Left Ventricle: Left ventricular ejection fraction, by estimation, is 60 to 65%. The left ventricle has normal function. The left ventricle has no regional wall motion abnormalities. The left ventricular internal cavity size was normal in size. There is no left ventricular hypertrophy. Left ventricular diastolic parameters were normal.  Right Ventricle: The right ventricular size is normal. Right vetricular wall thickness was not well visualized. Right ventricular systolic function is mildly reduced.  Left Atrium: Left atrial size was normal in size.  Right Atrium: Right atrial size was normal in size.  Pericardium: There is no evidence of pericardial effusion.  Mitral Valve: The mitral valve is normal in structure. Mild mitral valve regurgitation.  Tricuspid Valve: The tricuspid valve is normal in structure. Tricuspid valve regurgitation is mild.  Aortic Valve: The aortic valve has been repaired/replaced. Aortic valve regurgitation is trivial. Aortic regurgitation PHT measures 448 msec. Aortic valve mean gradient measures 11.5 mmHg. Aortic valve peak gradient measures 24.7 mmHg. Aortic valve area, by VTI measures 1.30 cm. There is a bileaflet valve present in the aortic position.  Pulmonic Valve: The pulmonic valve was not well visualized. Pulmonic valve regurgitation is  moderate.  Aorta: The aortic root and ascending aorta are structurally normal, with no evidence of dilitation.  IAS/Shunts: The atrial septum is grossly normal.   LEFT VENTRICLE PLAX 2D LVIDd:         3.70 cm   Diastology LVIDs:         2.50 cm   LV e' medial:    10.20 cm/s LV PW:         1.30 cm   LV E/e' medial:  9.0 LV IVS:        1.40 cm   LV e' lateral:   14.00 cm/s LVOT diam:     2.10 cm   LV E/e' lateral: 6.6 LV SV:         67 LV SV Index:   36 LVOT Area:     3.46 cm   RIGHT VENTRICLE RV Basal diam:  2.50 cm RV S prime:     7.95 cm/s TAPSE (M-mode): 1.6 cm RVSP:           33.9 mmHg  LEFT ATRIUM  Index        RIGHT ATRIUM           Index LA diam:        3.60 cm 1.96 cm/m   RA Pressure: 3.00 mmHg LA Vol (A2C):   38.2 ml 20.84 ml/m  RA Area:     14.10 cm LA Vol (A4C):   37.6 ml 20.51 ml/m  RA Volume:   36.70 ml  20.02 ml/m LA Biplane Vol: 40.1 ml 21.87 ml/m AORTIC VALVE AV Area (Vmax):    1.14 cm AV Area (Vmean):   1.17 cm AV Area (VTI):     1.30 cm AV Vmax:           248.50 cm/s AV Vmean:          158.500 cm/s AV VTI:            0.510 m AV Peak Grad:      24.7 mmHg AV Mean Grad:      11.5 mmHg LVOT Vmax:         82.10 cm/s LVOT Vmean:        53.700 cm/s LVOT VTI:          0.192 m LVOT/AV VTI ratio: 0.38 AI PHT:            448 msec  AORTA Ao Asc diam: 2.70 cm  MITRAL VALVE                TRICUSPID VALVE MV Area (PHT): 3.37 cm     TR Peak grad:   30.9 mmHg MV Decel Time: 225 msec     TR Vmax:        278.00 cm/s MR Peak grad: 133.9 mmHg    Estimated RAP:  3.00 mmHg MR Mean grad: 74.0 mmHg     RVSP:           33.9 mmHg MR Vmax:      578.50 cm/s MR Vmean:     398.0 cm/s    SHUNTS MV E velocity: 92.20 cm/s   Systemic VTI:  0.19 m MV A velocity: 105.00 cm/s  Systemic Diam: 2.10 cm MV E/A ratio:  0.88  Kristeen Miss MD Electronically signed by Kristeen Miss MD Signature Date/Time: 03/29/2021/5:04:57 PM    Final             Risk  Assessment/Calculations:     HYPERTENSION CONTROL Vitals:   11/15/22 0939 11/15/22 1117  BP: (!) 146/70 (!) 140/78    The patient's blood pressure is elevated above target today.  In order to address the patient's elevated BP: The blood pressure is usually elevated in clinic.  Blood pressures monitored at home have been optimal.; Blood pressure will be monitored at home to determine if medication changes need to be made.; Follow up with general cardiology has been recommended.          Physical Exam:   VS:  BP (!) 140/78   Pulse (!) 56   Ht 5\' 5"  (1.651 m)   Wt 154 lb (69.9 kg)   SpO2 98%   BMI 25.63 kg/m    Wt Readings from Last 3 Encounters:  11/15/22 154 lb (69.9 kg)  02/15/22 165 lb (74.8 kg)  08/29/21 169 lb (76.7 kg)    GEN: Well nourished, well developed in no acute distress NECK: No JVD; No carotid bruits CARDIAC: Mechanical valve click, RRR, no murmurs, rubs, gallops RESPIRATORY:  Clear to auscultation without rales, wheezing or rhonchi  ABDOMEN: Soft,  non-tender, non-distended EXTREMITIES:  No edema; No deformity   ASSESSMENT AND PLAN: .    S/p mechanical AVR - We discussed the findings on her echocardiogram extensively, including that the AV working appropriately and trivial TR, mild MR stable and as asymptomatic moderate PR not of concern. Continue BP control to prevent progression, as below. She will continue taking her Coumadin as prescribed. Denies bleeding complications.   CAD s/p CABG - Stable with no anginal symptoms. No indication for ischemic evaluation. GDMT includes rosuvastatin. No BB due to bundle branch block. No aspirin due to warfarin.   HLD, LDL goal <70 - 02/2022 total cholesterol 168, triglyceride 127, HDL 53, LDL 93. Prior intolerance to Atorvastatin, higher intensity Rosuvastatin, Zetia, Welchol. Recommend aiming for 150 minutes of moderate intensity activity per week and following a heart healthy diet.  Resume Rosuvastatin 5mg  twice per week -  no improvement in myalgias with not taking. Will reach out to PharmD team to see if she is candidate for Leqvio.  HTN - Her blood pressure is stable at home, mildly elevated in clinic, likely element of white coat hypertension. She will contact the clinic if her blood pressure is elevated above 135 at home. She will continue taking amlodipine.   RBBB / LAFB - She has no symptoms with this. Continue to monitor with periodic EKGs. Avoid AV nodal blocking agents.  6. Prediabetes - She was educated about diet changes. We discussed the Silver Sneakers program at the [facility], for which she was interested in. We also referred her to a nutritionist.       Dispo: She will follow up with Dr. Swaziland in December as previously scheduled.  Signed, Alver Sorrow, NP

## 2022-11-15 ENCOUNTER — Ambulatory Visit (INDEPENDENT_AMBULATORY_CARE_PROVIDER_SITE_OTHER): Payer: Medicare Other | Admitting: Family

## 2022-11-15 ENCOUNTER — Telehealth: Payer: Self-pay

## 2022-11-15 ENCOUNTER — Encounter (HOSPITAL_BASED_OUTPATIENT_CLINIC_OR_DEPARTMENT_OTHER): Payer: Self-pay | Admitting: Family

## 2022-11-15 VITALS — BP 140/78 | HR 56 | Ht 65.0 in | Wt 154.0 lb

## 2022-11-15 DIAGNOSIS — I359 Nonrheumatic aortic valve disorder, unspecified: Secondary | ICD-10-CM | POA: Diagnosis not present

## 2022-11-15 DIAGNOSIS — R7303 Prediabetes: Secondary | ICD-10-CM

## 2022-11-15 DIAGNOSIS — I10 Essential (primary) hypertension: Secondary | ICD-10-CM

## 2022-11-15 DIAGNOSIS — I444 Left anterior fascicular block: Secondary | ICD-10-CM

## 2022-11-15 DIAGNOSIS — Z952 Presence of prosthetic heart valve: Secondary | ICD-10-CM

## 2022-11-15 DIAGNOSIS — Z0000 Encounter for general adult medical examination without abnormal findings: Secondary | ICD-10-CM

## 2022-11-15 DIAGNOSIS — E785 Hyperlipidemia, unspecified: Secondary | ICD-10-CM

## 2022-11-15 DIAGNOSIS — Z951 Presence of aortocoronary bypass graft: Secondary | ICD-10-CM

## 2022-11-15 DIAGNOSIS — I4510 Unspecified right bundle-branch block: Secondary | ICD-10-CM

## 2022-11-15 MED ORDER — ROSUVASTATIN CALCIUM 5 MG PO TABS: 5.0000 mg | ORAL_TABLET | ORAL | 3 refills | Status: DC

## 2022-11-15 NOTE — Telephone Encounter (Signed)
Called patient per health coaching referral from Gillian Shields, NP, to improve healthy eating habits. Patient did not answer. Left message for patient to return call.   Renaee Munda, MS, ERHD, [facility]  Care Guide, Health & Wellness Coach [address] [place] [address] Telephone: [phone] Email: [email or web address]

## 2022-11-15 NOTE — Patient Instructions (Signed)
Medication Instructions:   Resume your Rosuvastatin.   *If you need a refill on your cardiac medications before your next appointment, please call your pharmacy*  Testing/Procedures: Your echocardiogram 03/2021 showed normal heart muscle function. 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 You had a few vales which were mild to moderately leaky, this is not of concern. We prevent if from worsening by keeping your blood pressure well controlled.   Follow-Up: At [facility], you and your health needs are our priority.  As part of our continuing mission to provide you with exceptional heart care, we have created designated Provider Care Teams.  These Care Teams include your primary Cardiologist (physician) and Advanced Practice Providers (APPs -  Physician Assistants and Nurse Practitioners) who all work together to provide you with the care you need, when you need it.  We recommend signing up for the patient portal called "MyChart".  Sign up information is provided on this After Visit Summary.  MyChart is used to connect with patients for Virtual Visits (Telemedicine).  Patients are able to view lab/test results, encounter notes, upcoming appointments, etc.  Non-urgent messages can be sent to your provider as well.   To learn more about what you can do with MyChart, go to [email or web address].    Your next appointment:   Follow up as scheduled with Dr. Swaziland  Other Instructions  Heart Healthy Diet Recommendations: A low-salt diet is recommended. Meats should be grilled, baked, or boiled. Avoid fried foods. Focus on lean protein sources like fish or chicken with vegetables and fruits. The American Heart Association is a Chief Technology Officer!  American Heart Association Diet and Lifeystyle Recommendations   Exercise recommendations: The American Heart Association recommends 150 minutes of moderate intensity exercise weekly. Try 30 minutes of moderate intensity exercise 4-5 times per  week. This could include walking, jogging, or swimming.

## 2022-11-21 ENCOUNTER — Telehealth: Payer: Self-pay

## 2022-11-21 ENCOUNTER — Encounter (HOSPITAL_BASED_OUTPATIENT_CLINIC_OR_DEPARTMENT_OTHER): Payer: Self-pay

## 2022-11-21 DIAGNOSIS — Z0000 Encounter for general adult medical examination without abnormal findings: Secondary | ICD-10-CM

## 2022-11-21 NOTE — Telephone Encounter (Signed)
Called patient to discuss health coaching for healthy eating per referral from Gillian Shields, NP. Patient did not answer. Left voicemail for patient to return call.   Renaee Munda, MS, ERHD, [facility]  Care Guide, Health & Wellness Coach [address] [place] [address] Telephone: [phone] Email: [email or web address]

## 2022-11-21 NOTE — Telephone Encounter (Signed)
Patient returned call to discuss health coaching and is interested in participating. Patient has been scheduled for an in-person session on 9/10 at [facility] at 10:00am.   Renaee Munda, MS, ERHD, [facility]  Care Guide, Health & Wellness Coach [address] [place] [address] Telephone: [phone] Email: [email or web address]

## 2022-11-22 ENCOUNTER — Other Ambulatory Visit: Payer: Self-pay

## 2022-11-25 LAB — SURGICAL PATHOLOGY

## 2022-11-26 ENCOUNTER — Ambulatory Visit: Payer: Medicare Other | Attending: Internal Medicine

## 2022-11-26 DIAGNOSIS — Z0000 Encounter for general adult medical examination without abnormal findings: Secondary | ICD-10-CM

## 2022-11-26 NOTE — Progress Notes (Signed)
HEALTH & WELLNESS COACHING INITIAL INTAKE  Appointment Outcome: Completed: Initial Session                         Start time: 10:06am   End time: 11:17am   Total Mins: 71 minutes    What are the Patient's goals from Coaching? Patient wants to reduce her daily sugar intake. Patient wants to change her routine around eating dinner and sweets afterwards. Patient desires to increase her water intake.   Why did they seek coaching now? Patient stated that she is prediabetic and she wants to improve her eating habits to lower her A1c below 5.9 and is seeking support and accountability with achieving her goal.   Readiness  Patient is in the preparation stage of improving her eating behaviors.   Coaching Progress Notes:  Patient stated that her biggest challenge is wanting to eat sweets after she eat at night between 8-9pm. Patient stated that she finds herself at times eating more than one serving of cake, ice cream, or cookies. Patient expressed having a challenge with practicing portion control. Patient is not currently tracking the sugar content in the foods that she eats.  Patient is concerned about this before since her A1c increased from 5.9 since the previous check 9 months ago. Patient desires to lower her A1c out of the prediabetic range to reduce her risk of developing diabetes.   Patient shared that she checks her glucose level 2 hours after eating dinner and it is within normal range, but finds it to be higher after waking up the next morning. Patient mentioned this is something that she may want to discuss with her PCP. Patient questioned if she needed to eat breakfast regularly. Discussed with patient about eating balanced meals to stabilize glucose levels throughout the day to avoid spikes from skipping meals.   Patient discussed that she is also concerned about the elevation in her LDL cholesterol. Patient mentioned that that it remains high after removing red meat and pork  products. Patient mentioned that she will have 2 eggs for breakfast.  Patient mentioned that she wants to work on improving her daily water consumption. Patient stated that she does not drink a lot of water and that a provider recommended that she consumed 108 oz of water daily. Patient mentioned that she would like to start consuming between 48-60 oz of water daily to start.   Patient mentioned that it would be helpful to track the nutrition in the foods that she eats to determine what foods she need to eat less of due to its sugar content. Patient was interested in knowing a strategy to help with reducing her snacking after eating. Discussed with patient alternative snacks that she could eat if she did choose to have a snack (e.g., fruits, vegetables, yogurt, or proteins). Discussed with patient ways that she could distract herself from the craving for sweets (e.g., engaging in an activity such as a crossword puzzle or reading, and/or drinking water).      Coaching Outcomes Patient is interested in tracking the nutritional value of the foods that she eats.  Patient to learn more about the natural occurring sugars and other macro nutrients in fresh produce and other foods that she consumes.   Patient stated that she believes that cooking at an earlier time would help her to refrain from eating after 7pm.   Patient expressed that she wants to focus on improving her cholesterol and increase physical activity  at a later time.   Patient co-created the following action steps outlined below to implement over the next two weeks.    AGREEMENTS SECTION Reviewed Coaching Agreement and Code of Ethics with Patient during initial session. Answered any questions the patient had if any regarding the Coaching Agreement and Code of Ethics. Patient verbally agreed to adhere to the Coaching Agreement and to abide by the Code of Ethics.  Mailed patient with a hard/electronic copy of the Coaching Agreement and  Code of Ethics.   Overall Goal(s): Decrease A1c to <5.7 over the next three months by gradually reducing daily sugar intake to 25 grams per day and increasing daily water intake to at least 64 oz.                                  Agreement/Action Steps:  Reduce daily sugar intake to 50 grams  Read food labels to calculate sugar content in foods Practice portion control per serving on food label Eat dinner by 7pm nightly by starting cooking around 4pm Reduce snacking after 7pm by drinking water and/or engaging in an activity of choice Eat a small piece of fruit that has a low glycemic index  Increase daily water intake Aim daily to drink 48-60 oz of water Use water bottle to track water consumption   Resources: Emailed patient an outline of her action steps as requested along with a daily food content tracker to aid in monitoring her sugar consumption, along with additional nutritional values of the foods she eats.

## 2022-12-04 ENCOUNTER — Telehealth: Payer: Self-pay

## 2022-12-04 DIAGNOSIS — Z0000 Encounter for general adult medical examination without abnormal findings: Secondary | ICD-10-CM

## 2022-12-04 NOTE — Telephone Encounter (Signed)
Patient called to reschedule health coaching appointment due to a conflicting appointment. Patient has been rescheduled as requested for 9/26 at 10:00am. Patient will be called at that time.    Renaee Munda, MS, ERHD, [facility]  Care Guide, Health & Wellness Coach [address] [place] [address] Telephone: [phone] Email: [email or web address]

## 2022-12-05 ENCOUNTER — Other Ambulatory Visit: Payer: Self-pay | Admitting: Cardiology

## 2022-12-05 DIAGNOSIS — Z952 Presence of prosthetic heart valve: Secondary | ICD-10-CM

## 2022-12-06 NOTE — Telephone Encounter (Signed)
Patient never read [employer], last logged in 9/9 Printed and mailed information to patient  Also sent another [employer]

## 2022-12-10 ENCOUNTER — Ambulatory Visit: Payer: Self-pay

## 2022-12-12 ENCOUNTER — Ambulatory Visit: Payer: Medicare Other | Attending: Cardiology

## 2022-12-12 DIAGNOSIS — Z0000 Encounter for general adult medical examination without abnormal findings: Secondary | ICD-10-CM

## 2022-12-12 NOTE — Progress Notes (Signed)
Appointment Outcome: Completed, Session #: 1                      Start time: 10:03am   End time: 10:33am   Total Mins: 30 minutes  AGREEMENTS SECTION   Overall Goal(s): Decrease A1c to <5.7 over the next three months by gradually reducing daily sugar intake to 25 grams per day and increasing daily water intake to at least 64 oz.                                  Agreement/Action Steps:  Reduce daily sugar intake to 50 grams  Read food labels to calculate sugar content in foods Practice portion control per serving on food label Eat dinner by 7pm nightly by starting cooking around 4pm Reduce snacking after 7pm by drinking water and/or engaging in an activity of  choice Eat a small piece of fruit that has a low glycemic index   Increase daily water intake Aim daily to drink 48-60 oz of water Use water bottle to track water consumption    Progress Notes:  Patient has set a target of reducing her added sugars to 50 grams per day because she was not sure how much sugar she was consuming without knowing how much she was currently consuming. Patient reported that she has been writing down the number of added sugars that is in the foods she has consumed. Patient shared that she consumes an average of 10 grams of added sugar from products that are not natural that contain 1 to 2 grams of added sugar per serving. Patient stated that the rest of her sugar consumption has naturally come from fresh fruits and vegetables such as carrots, beets, apples, grapes, and blueberries. Patient shared that these produces have been her snacks in addition to sugar free chickpea chips.   Patient mentioned that she has only consumed a snack after her meal on 3 occasions but feels good that she is making healthier choices. Patient stated that she has not been as mindful as she had planned with drinking 48-60oz of water daily and to help reduce late night snacking. Patient stated that she is drinking one or two 16 oz  glasses of water per day. Patient shared that she would lose track of time during the day to consume the target amount of water. Patient mentioned that she thought she had a water bottle at home that she could use to help track her water consumption but needs to purchase one.   Patient stated that practicing portion control per serving size on the food label has not been a challenge. Patient expressed that she still struggles with eating later than 7pm on some occasions. Patient stated that she has eaten before her husband has gotten home, but at the earliest, it is around 7-7:30pm. Patient stated that the main issue that she faces is being involved in various social groups and activities that lead her to starting her cooking later than 4pm, which causes her to eat later at night. Patient reported that making such changes she has lost an average of 1.5 lbs. over the past 2 weeks.    Indicators of Success and Accountability: Patient recorded that she consumed an average of 10 grams of added sugar per day; less than the target of 50 grams.  Readiness: Patient is in the action stage of reducing her daily sugar intake to decrease her  A1c and increasing her daily water intake.  Strengths and Supports: Patient is relying on being systematic by keeping a written record of her progress.   Challenges and Barriers: Patient stated that being involved in other activities may be a challenge to her cooking earlier in the day as planned.    Coaching Outcomes: Discussed with patient strategies that she felt would help her improve her eating schedule. Patient stated that she has thought about meal prepping her meats ahead of time and freezing them. Patient stated that she can do it on a day that she doesn't have activities planned. Patient stated that if she could find crockpot recipes, that would be helpful too to cut down on the time she needs to spend preparing meals. Patient stated that Monday can be her meal  prepping day for the week.   Patient stated that she has thought about using her Apple Watch to remind herself to drink water every hour and wants to by a water bottle from [employer] by Saturday. Patient expressed interest in purchasing a bottle that she can infuse fruit for added flavor.      Overall Goal(s): Decrease A1c to <5.7 over the next three months by gradually reducing daily sugar intake to 25 grams per day and increasing daily water intake to at least 64 oz.                                 Agreement/Action Steps: Reduce daily sugar intake to 50 grams Read food labels to calculate sugar content in foods Practice portion control per serving on food label Eat dinner by 7pm nightly by starting cooking around 4pm Meal prep meats on Mondays and freeze them Make crockpot meals on Mondays Reduce snacking after 7pm by drinking water and/or engaging in an activity of choice Eat a small piece of fruit that has a low glycemic index  Increase daily water intake Aim daily to drink 48-60 oz of water Use water bottle to track water consumption (purchase water bottle by Saturday) Set a daily reminder to drink water every hour on Apple Watch    Attempted: Fulfilled - Patient has read food labels to calculate sugar content in foods, practiced portion control, reduced snacking, and eat a small piece of fruit as planned.  Partial - Patient is consuming between 16-32 oz of water per day out of 48-60 oz of water as planned. Patient has eaten dinner by 7pm approximately 50% of the time.   Not met - patient did not attempt the agreement and did not express concern   Resources: Sent patient an email with an outline of action steps that were revised during today's session.

## 2022-12-17 ENCOUNTER — Ambulatory Visit: Payer: Medicare Other | Attending: Cardiology

## 2022-12-17 DIAGNOSIS — Z7901 Long term (current) use of anticoagulants: Secondary | ICD-10-CM | POA: Diagnosis not present

## 2022-12-17 DIAGNOSIS — I359 Nonrheumatic aortic valve disorder, unspecified: Secondary | ICD-10-CM | POA: Diagnosis not present

## 2022-12-17 LAB — POCT INR: INR: 2.6 (ref 2.0–3.0)

## 2022-12-17 NOTE — Patient Instructions (Signed)
Continue taking 5mg  daily EXCEPT 7.5mg  on Mondays, Wednesdays and Fridays.  Stay consistent with greens each week.  Repeat INR in 5 weeks. [facility] # [phone]

## 2022-12-26 ENCOUNTER — Ambulatory Visit: Payer: Medicare Other

## 2022-12-27 ENCOUNTER — Telehealth: Payer: Self-pay

## 2022-12-27 DIAGNOSIS — Z0000 Encounter for general adult medical examination without abnormal findings: Secondary | ICD-10-CM

## 2022-12-27 NOTE — Telephone Encounter (Addendum)
Called patient to reschedule health coaching appointment from 10/10. Patient has been rescheduled for 10/17 at 8:30am. Patient will be called at this time.    Renaee Munda, MS, ERHD, [facility]  Care Guide, Health & Wellness Coach [address] [place] [address] Telephone: [phone] Email: [email or web address]

## 2022-12-29 ENCOUNTER — Other Ambulatory Visit: Payer: Self-pay | Admitting: Cardiology

## 2023-01-02 ENCOUNTER — Ambulatory Visit: Payer: Medicare Other | Attending: Internal Medicine

## 2023-01-02 DIAGNOSIS — Z0000 Encounter for general adult medical examination without abnormal findings: Secondary | ICD-10-CM

## 2023-01-02 NOTE — Progress Notes (Signed)
Appointment Outcome: Completed, Session #: 2                         Start time: 8:31am   End time: 9:00am   Total Mins: 29 minutes  AGREEMENTS SECTION   Overall Goal(s): Decrease A1c to <5.7 over the next three months by gradually reducing daily sugar intake to 25 grams per day and increasing daily water intake to at least 64 oz.                                  Agreement/Action Steps:  Reduce daily sugar intake to 50 grams  Read food labels to calculate sugar content in foods Practice portion control per serving on food label Eat dinner by 7pm nightly by starting cooking around 4pm Reduce snacking after 7pm by drinking water and/or engaging in an activity of choice Eat a small piece of fruit that has a low glycemic index   Increase daily water intake Aim daily to drink 48-60 oz of water Use water bottle to track water consumption   Progress Notes:  Patient reported that she has been able to refrain from eating no added sugar on most days by eating whole foods (cooking vegetables and eating a piece of fruit for a snack). Patient shared that her fruits of choice are apples and blueberries, along with kiwi and sometimes strawberries. Patient shared that she ate a small slice a cake she got from church and was able to refrain from eating more than one piece due to not having two slices. Patient shared that she was satisfied with the one slice of cake and believes that she is becoming more disciplined and will be able to continue to limit herself to one piece of cake when she decides to eat it.   Patient stated that she has also been able to manage her sugar intake by reading food labels and choosing products that are low to no added sugar but may have natural occurring sugar. Patient stated that she purchased a water infused bottle that she has been putting oranges in the water for flavor. Patient stated that prior to getting the water bottle, she was drinking out of a class and consumed  approximately 30-40 oz of water per day. Patient shared that she is going to start using cucumbers to flavor her water as well.  Patient stated that since she is cooking almost daily, she incorporated freezing cooked meats that helps reduce her cooking time and avoid eating past 7 pm on most nights. Patient shared there are 2-3 nights per week that she eats close to 8pm because she is getting home late due to other commitments. Patient stated that the changes that she has been making are improving and she believes that she can manage these changes and make them a lifestyle change. Patient stated that she is motivated to continuing working on these health behaviors because she has a health concern that she wants to improve.   Indicators of Success and Accountability:  Patient has been able to make improvements with all her action steps, and reduced eating past 7pm to 2-3 nights per week.  Readiness: Patient is in the action stage of reducing her daily sugar intake and increasing her daily water intake.   Strengths and Supports: Patient stated that she is relying on being disciplined.   Challenges and Barriers: Patient stated that there are  days that she gets home later due to commitments that interferes with her being able to consistently eat before 7pm daily.   Coaching Outcomes: Patient expressed interest in two additional goals that she would like to work on, increasing her exercise and increasing her sleep to 7-8 hours per night. Patient stated that she has an exercise bike at home or can walk for 30 minutes. Patient mentioned her challenge is being consistent. Patient shared that she finds herself reading and watching tv in the evenings from 8 or 9pm until about 1 am. Patient shared that in her mind she has a mental alarm that she wants to be in bed by 10:30pm and no later than 11:00pm.  Discussed with patient what she would like to focus on, and patient determined that exercise would be a goal  she could use additional support with. Co-created action steps to increase patient's weekly exercise to 150 minutes. Patient did not revise her action steps regarding her original goals. Patient's goal of improving sleep hygiene was included below as a reminder to revisit during next session to determine if patient wants additional support.    Patient revised her action agreement as outlined below to implement over the next two weeks.   Overall Goal(s): Decrease A1c to <5.7 over the next three months by gradually reducing daily sugar intake to 25 grams per day and increasing daily water intake to at least 64 oz. Exercise 150 minutes per week Improve sleep hygiene (will continue to discuss if patient determines she needs support around this goal)                                 Agreement/Action Steps: Reduce daily sugar intake to 50 grams Read food labels to calculate sugar content in foods Practice portion control per serving on food label Eat dinner by 7pm nightly by starting cooking around 4pm Reduce snacking after 7pm by drinking water and/or engaging in an activity of choice Eat a small piece of fruit that has a low glycemic index Increase daily water intake Aim daily to drink 48-64 oz of water Use water bottle to track water consumption Exercise 150 minutes per week Walk or ride bike 30 minutes per day 5 days/week Can be in 10-minute increments throughout the day Improve sleep hygiene Set bedtime for 10:30pm-11:00pm     Attempted: Fulfilled - Patient read food labels to determine sugar content in food, practice portion control, reducing snacking after 7pm, and eating a small piece of fruit.   Partial - Patient was able to eat prior to 7pm night 4 to 5 days per week. Patient averaged 30-40 oz of water prior to purchasing an infused water bottle recently and was able to drink 64 oz of water one day within two weeks.

## 2023-01-16 ENCOUNTER — Ambulatory Visit: Payer: Medicare Other | Attending: Cardiology

## 2023-01-16 DIAGNOSIS — Z0000 Encounter for general adult medical examination without abnormal findings: Secondary | ICD-10-CM

## 2023-01-16 NOTE — Progress Notes (Addendum)
Appointment Outcome: Completed, Session #: 2                         Start time: 8:31am   End time: 9:05am   Total Mins: 34 minutes  AGREEMENTS SECTION   Overall Goal(s): Decrease A1c to <5.7 over the next three months by gradually reducing daily sugar intake to 25 grams per day and increasing daily water intake to at least 64 oz. Exercise 150 minutes per week Improve sleep hygiene (will continue to discuss if patient determines she needs support around this goal)                                  Agreement/Action Steps: Reduce daily sugar intake to 50 grams Read food labels to calculate sugar content in foods Practice portion control per serving on food label Eat dinner by 7pm nightly by starting cooking around 4pm Reduce snacking after 7pm by drinking water and/or engaging in an activity of choice Eat a small piece of fruit that has a low glycemic index  Increase daily water intake Aim daily to drink 48-64 oz of water Use water bottle to track water consumption  Exercise 150 minutes per week Walk or ride bike 30 minutes per day 5 days/week Can be in 10-minute increments throughout the day  Improve sleep hygiene Set bedtime for 10:30pm-11:00pm    Progress Notes:  Patient reported that her A1C was 6.1 around June or July and she wanted to get it lower than this to get her out of the prediabetes range. Patient mentioned that she has an appointment with her cardiologist and will request to get her A1c checked to see if it has improved. Patient stated that she has continued to read food labels to choose products with 1 gram of added sugar or less. Patient mentioned that she typically purchases whole foods to prepare to reduce her intake of added sugar. Patient reported that over the past two weeks, she drunk lemonade at church, and had one small slice of cake and pumpkin pie. Patient shared that she was able to refrain from eating more than a serving of these sweets. Patient mentioned  that she is reading the food label to also monitor her carbohydrate and sodium intake.   Patient shared that she has seen a great improvement in her ability to eat by 7pm nightly without challenges and is aiming to eat dinner by 6pm. Patient stated that when she did eat a snack after 7pm, she would choose an apple or blueberries. Patient mentioned that she ate chips twice that has no sugar added. Patient shared that practicing portion control is going well. Patient shared that she has managed to drink between 48-50 oz of water per day most days. Patient mentioned that she was able to drink 64 oz of water 3 days within the past two weeks. Patient stated that her challenge to drinking 64 oz of water per day is remembering to start drinking water between 11am or 12pm each day. Patient shared that on the days that she drinks less water, she has meetings, errands, and other things that she needs to do that diverts her attention away from eating and drinking early in the morning. Patient stated that she does remember to take water with her when she leaves the house, which helps her reach her minimum water intake for the day.   Patient explained that  she did not get into a routine with engaging in exercise since the last session. Patient stated that her being lazy/unmotivated is still the challenge to her getting started. Patient shared that she would look at her equipment in her home or say that the weather is nice and that she should get out and walk or get started with using her equipment. Patient stated that it is in her mind, but she needs a motivator. Discussed with patient the benefits of walking according to the American Diabetes Association. Patient stated that knowing that walking may help with lowering her A1c is something that will motivate her to walk. Patient then determined that she would walk midday during the week while listening to something that is motivational.   Patient shared that she has been  able to improve her sleep hygiene over the past two weeks. Patient stated that she has been able to get to bed by 11-11:30pm or sometimes earlier majority of the nights. Patient shared that it was on two occasions when she went to bed after 1:00am. Patient expressed that she feels better now that she has been getting more sleep and has more energy during the day. Patient explained that making going to bed earlier a priority has been helpful in implementing this action step.    Indicators of Success and Accountability:  Patient was able to successfully implement her action steps towards reducing her daily sugar intake below 50 grams of added sugar per day.   Readiness: Patient is in the action stage of decreasing her A1c and improving sleep hygiene. Patient is in the contemplation stage of increasing her exercise.   Strengths and Supports: Patient is relying on being organized.   Challenges and Barriers: Patient stated that being lazy/unmotivated is the challenge to getting started with increasing her exercise.    Coaching Outcomes: Patient is planning to eat dinner by 6pm moving forward.  Patient is planning on setting an alarm on her cell phone for 9:00am as a reminder to start drinking water in the morning to help her consume more than 48 oz of water per day.   Patient desires to implement her action step to walk midday 30 minutes per day 5 days week while listening to something motivational.  Patient will continue to implement all other strategies as outlined above without modification.    Attempted: Fulfilled - Patient implement her action steps as outlined above to reduce her daily sugar intake, increase daily water intake, and improve sleep hygiene.   Not met - Patient was not able to get started on increasing her exercise.

## 2023-01-21 ENCOUNTER — Ambulatory Visit: Payer: Medicare Other | Attending: Cardiovascular Disease | Admitting: *Deleted

## 2023-01-21 DIAGNOSIS — Z7901 Long term (current) use of anticoagulants: Secondary | ICD-10-CM

## 2023-01-21 DIAGNOSIS — I359 Nonrheumatic aortic valve disorder, unspecified: Secondary | ICD-10-CM | POA: Diagnosis not present

## 2023-01-21 LAB — POCT INR: INR: 2.9 (ref 2.0–3.0)

## 2023-01-21 NOTE — Patient Instructions (Signed)
Description   Today take 7.5mg  of warfarin (since missed Sunday dose) then continue taking 5mg  daily EXCEPT 7.5mg  on Mondays, Wednesdays and Fridays.  Stay consistent with greens each week.  Repeat INR in 6 weeks. [facility] # [phone]

## 2023-01-30 ENCOUNTER — Telehealth: Payer: Self-pay | Admitting: Licensed Clinical Social Worker

## 2023-01-30 ENCOUNTER — Ambulatory Visit: Payer: Medicare Other

## 2023-01-30 NOTE — Telephone Encounter (Signed)
CSW contacted patient to inform that Tamara Patterson, Health Coach is out of the office unexpectedly and will need to reschedule visit for today. Patient grateful for the call. Lasandra Beech, LCSW, CCSW-MCS [phone]

## 2023-02-04 ENCOUNTER — Telehealth: Payer: Self-pay

## 2023-02-04 DIAGNOSIS — Z0000 Encounter for general adult medical examination without abnormal findings: Secondary | ICD-10-CM

## 2023-02-04 NOTE — Telephone Encounter (Signed)
Called patient to reschedule health coaching appt. Patient did not answer. Left message for patient to return call.    Renaee Munda, MS, ERHD, [facility]  Care Guide, Health & Wellness Coach [address] [place] [address] Telephone: [phone] Email: [email or web address]

## 2023-02-15 NOTE — Progress Notes (Unsigned)
[name] Date of Birth: Nov 10, 1940   History of Present Illness: Tamara Patterson is seen today for followup. 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 This included mechanical aortic valve replacement and grafting of the aortic root with a #23 mm St. Jude valve conduit. She also had coronary bypass surgery including an IMA graft to the LAD, saphenous vein graft to the right coronary, and saphenous vein graft to the first and second obtuse marginal vessels. 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 She was treated with pulse steroids for a period of time and was weaned off of this. Last Myoview study was in January 2018 and was normal. Echo in 2011 was satisfactory.  She has been intolerant of statins and Zetia due to myalgias. Also intolerant of Welchol due to hypoglycemia.  Echo in Jan 2023 showed normal LV function and normal prosthetic AV function.  On follow up today she is doing well. Energy level is good.  No chest pain, dyspnea, palpitations, dizziness.  No edema. No bleeding. BP at home typically 135- 140 systolic.   Current Outpatient Medications on File Prior to Visit  Medication Sig Dispense Refill   amoxicillin (AMOXIL) 500 MG capsule Take 2000 mg (4 capsules) 30-60 min prior to dental procedures. 4 capsule 3   Barberry-Oreg Grape-Goldenseal (BERBERINE COMPLEX PO) Take 2 tablets by mouth daily.     Cholecalciferol (VITAMIN D3) 2000 units TABS Take 2,000 Units by mouth daily.     clobetasol ointment (TEMOVATE) 0.05 % clobetasol 0.05 % topical ointment  APPLY OINTMENT TOPICALLY TO AFFECTED AREA TWICE DAILY     escitalopram (LEXAPRO) 20 MG tablet Take 1 tablet (20 mg total) by mouth daily. 90 tablet 3   neomycin-polymyxin b-dexamethasone (MAXITROL) 3.5-10000-0.1 OINT      RESTASIS 0.05 % ophthalmic emulsion 1 drop as directed.     rosuvastatin (CRESTOR) 5 MG tablet Take 1 tablet (5 mg total) by  mouth 2 (two) times a week. Take one tablet twice a week. 30 tablet 3   warfarin (COUMADIN) 5 MG tablet TAKE 1 TO 1 & 1/2 (ONE & ONE-HALF) TABLETS BY MOUTH ONCE DAILY AS  DIRECTED  BY  COUMADIN  CLINIC 120 tablet 1   No current facility-administered medications on file prior to visit.    Allergies  Allergen Reactions   Codeine Nausea Only   Lipitor [Atorvastatin Calcium] Other (See Comments)    Caused muscle aching   Atenolol Other (See Comments)    cramps    Past Medical History:  Diagnosis Date   Anticoagulant long-term use    CAD (coronary artery disease)    Depression    seasonal   Dyslipidemia    GERD (gastroesophageal reflux disease)    Giant cell arteritis (HCC)    per aortotomy biopsy in 1991   HTN (hypertension)    Hx of CABG 1991   LIMA-LAD, SVG-RCA, SVG-1st and 2ndOM   Panic anxiety syndrome    S/P AVR (aortic valve replacement)    with root grafting; #34mm  St. Jude valve conduit; Last Echo 2007    Past Surgical History:  Procedure Laterality Date   AORTIC VALVE REPLACEMENT     with root grafting   CARDIAC SURGERY     CHOLECYSTECTOMY     CORONARY ARTERY BYPASS GRAFT      Social History   Tobacco Use  Smoking Status Former  Smokeless Tobacco Never  Social History   Substance and Sexual Activity  Alcohol Use No    Family History  Problem Relation Age of Onset   Liver disease Mother    Pancreatic cancer Father     Review of Systems: As noted in history of present illness. All other systems were reviewed and are negative.  Physical Exam: BP (!) 148/62   Pulse 71   Ht 5\' 5"  (1.651 m)   Wt 154 lb (69.9 kg)   SpO2 97%   BMI 25.63 kg/m  GENERAL:  Well appearing BF in NAD HEENT:  PERRL, EOMI, sclera are clear. Oropharynx is clear. NECK:  No jugular venous distention, carotid upstroke brisk and symmetric, no bruits, no thyromegaly or adenopathy LUNGS:  Clear to auscultation bilaterally CHEST:  Unremarkable HEART:  RRR,  PMI not  displaced or sustained,good mechanical AV click, no S3, no S4: no clicks, no rubs, no murmurs ABD:  Soft, nontender. BS +, no masses or bruits. No hepatomegaly, no splenomegaly EXT:  2 + pulses throughout, no edema, no cyanosis no clubbing SKIN:  Warm and dry.  No rashes NEURO:  Alert and oriented x 3. Cranial nerves II through XII intact. PSYCH:  Cognitively intact    LABORATORY DATA:  Lab Results  Component Value Date   WBC 5.4 02/15/2022   HGB 13.5 02/15/2022   HCT 41.7 02/15/2022   PLT 236 02/15/2022   GLUCOSE 96 02/15/2022   CHOL 168 02/15/2022   TRIG 127 02/15/2022   HDL 53 02/15/2022   LDLDIRECT 126.4 10/07/2012   LDLCALC 93 02/15/2022   ALT 28 02/15/2022   AST 25 02/15/2022   NA 142 02/15/2022   K 5.1 02/15/2022   CL 104 02/15/2022   CREATININE 0.96 02/15/2022   BUN 17 02/15/2022   CO2 25 02/15/2022   INR 2.9 01/21/2023   HGBA1C 5.9 (H) 02/15/2022   Labs from primary care dated 05/02/15: normal CMET Dated 05/25/15: A1c5.8% Dated 11/10/15: cholesterol 177, triglycerides 91, LDL 110, HDL 49. Dated 05/17/19: glucose 114. A1c 5.6%. BMET otherwise normal. Dated 03/14/20: normal BMET  Ecg not done today    Myoview 03/20/16: Study Highlights     The left ventricular ejection fraction is normal (55-65%). Nuclear stress EF: 64%. There was no ST segment deviation noted during stress. Blood pressure demonstrated a normal response to exercise.   Normal study, no evidence for ischemia or infarction   Echo 03/29/21: IMPRESSIONS     1. Left ventricular ejection fraction, by estimation, is 60 to 65%. The  left ventricle has normal function. The left ventricle has no regional  wall motion abnormalities. Left ventricular diastolic parameters were  normal.   2. Right ventricular systolic function is mildly reduced. The right  ventricular size is normal.   3. The mitral valve is normal in structure. Mild mitral valve  regurgitation.   4. The aortic valve has been  repaired/replaced. Aortic valve  regurgitation is trivial.   5. Pulmonic valve regurgitation is moderate.   Assessment / Plan: 1. Status post mechanical aortic valve replacement.  Exam is stable. INR has been therapeutic.   Routine SBE prophylaxis. Echo in Jan 2023 showed normal valve function.  2. Status post CABG for ostial coronary disease. No angina. Myoview 03/20/16 was normal. Continue current medical therapy. She is asymptomatic.   3. Hyperlipidemia.   On very low dose Crestor. Intolerant of multiple meds. Will continue current therapy. Update labs today  4. Right bundle branch block/LAFB- chronic. She is asymptomatic.  5.  HTN  elevated mildy  today and this has been consistent. Will increase amlodipine to 5 mg daily.  Monitor. Encourage increased aerobic activity and weight loss.   6. Prediabetes. Check A1c  Will update lab work today  I will follow up in 6 months

## 2023-02-18 ENCOUNTER — Telehealth: Payer: Self-pay

## 2023-02-18 DIAGNOSIS — Z0000 Encounter for general adult medical examination without abnormal findings: Secondary | ICD-10-CM

## 2023-02-18 NOTE — Telephone Encounter (Signed)
Called patient to reschedule health coaching appointment due to being out of office on 11/14. Patient requested to be rescheduled on 12/9 at 8:30am. Patient will be called at this time.   Renaee Munda, MS, ERHD, [facility]  Care Guide, Health & Wellness Coach [address] [place] [address] Telephone: [phone] Email: [email or web address]

## 2023-02-19 ENCOUNTER — Ambulatory Visit: Payer: Medicare Other | Attending: Cardiology | Admitting: Cardiology

## 2023-02-19 ENCOUNTER — Encounter: Payer: Self-pay | Admitting: Cardiology

## 2023-02-19 VITALS — BP 148/62 | HR 71 | Ht 65.0 in | Wt 154.0 lb

## 2023-02-19 DIAGNOSIS — I444 Left anterior fascicular block: Secondary | ICD-10-CM | POA: Diagnosis not present

## 2023-02-19 DIAGNOSIS — I359 Nonrheumatic aortic valve disorder, unspecified: Secondary | ICD-10-CM | POA: Diagnosis not present

## 2023-02-19 DIAGNOSIS — Z952 Presence of prosthetic heart valve: Secondary | ICD-10-CM

## 2023-02-19 DIAGNOSIS — R7303 Prediabetes: Secondary | ICD-10-CM

## 2023-02-19 DIAGNOSIS — I4510 Unspecified right bundle-branch block: Secondary | ICD-10-CM

## 2023-02-19 DIAGNOSIS — I10 Essential (primary) hypertension: Secondary | ICD-10-CM

## 2023-02-19 DIAGNOSIS — E785 Hyperlipidemia, unspecified: Secondary | ICD-10-CM

## 2023-02-19 MED ORDER — AMLODIPINE BESYLATE 5 MG PO TABS: 5.0000 mg | ORAL_TABLET | Freq: Every day | ORAL | 3 refills | Status: DC

## 2023-02-19 NOTE — Patient Instructions (Signed)
Medication Instructions:  Increase Amlodipine to 5 mg daily Continue all other medications *If you need a refill on your cardiac medications before your next appointment, please call your pharmacy*   Lab Work: Cbc,cmet,lipid panel,A1c today   Testing/Procedures: None ordered   Follow-Up: At [facility], you and your health needs are our priority.  As part of our continuing mission to provide you with exceptional heart care, we have created designated Provider Care Teams.  These Care Teams include your primary Cardiologist (physician) and Advanced Practice Providers (APPs -  Physician Assistants and Nurse Practitioners) who all work together to provide you with the care you need, when you need it.  We recommend signing up for the patient portal called "MyChart".  Sign up information is provided on this After Visit Summary.  MyChart is used to connect with patients for Virtual Visits (Telemedicine).  Patients are able to view lab/test results, encounter notes, upcoming appointments, etc.  Non-urgent messages can be sent to your provider as well.   To learn more about what you can do with MyChart, go to [email or web address].    Your next appointment:  6 months    Call in March to schedule June appointment     Provider:  Dr.Jordan

## 2023-02-20 LAB — CBC WITH DIFFERENTIAL/PLATELET
Basophils Absolute: 0 10*3/uL (ref 0.0–0.2)
Basos: 1 %
EOS (ABSOLUTE): 0.1 10*3/uL (ref 0.0–0.4)
Eos: 1 %
Hematocrit: 40.7 % (ref 34.0–46.6)
Hemoglobin: 12.7 g/dL (ref 11.1–15.9)
Immature Grans (Abs): 0 10*3/uL (ref 0.0–0.1)
Immature Granulocytes: 0 %
Lymphocytes Absolute: 2.2 10*3/uL (ref 0.7–3.1)
Lymphs: 43 %
MCH: 27.1 pg (ref 26.6–33.0)
MCHC: 31.2 g/dL — ABNORMAL LOW (ref 31.5–35.7)
MCV: 87 fL (ref 79–97)
Monocytes Absolute: 0.6 10*3/uL (ref 0.1–0.9)
Monocytes: 12 %
Neutrophils Absolute: 2.1 10*3/uL (ref 1.4–7.0)
Neutrophils: 43 %
Platelets: 221 10*3/uL (ref 150–450)
RBC: 4.69 x10E6/uL (ref 3.77–5.28)
RDW: 13.4 % (ref 11.7–15.4)
WBC: 4.9 10*3/uL (ref 3.4–10.8)

## 2023-02-20 LAB — LIPID PANEL
Chol/HDL Ratio: 2.7 {ratio} (ref 0.0–4.4)
Cholesterol, Total: 154 mg/dL (ref 100–199)
HDL: 57 mg/dL (ref >39)
LDL Chol Calc (NIH): 81 mg/dL (ref 0–99)
Triglycerides: 82 mg/dL (ref 0–149)
VLDL Cholesterol Cal: 16 mg/dL (ref 5–40)

## 2023-02-20 LAB — COMPREHENSIVE METABOLIC PANEL
ALT: 18 [IU]/L (ref 0–32)
AST: 24 [IU]/L (ref 0–40)
Albumin: 4.3 g/dL (ref 3.7–4.7)
Alkaline Phosphatase: 118 [IU]/L (ref 44–121)
BUN/Creatinine Ratio: 20 (ref 12–28)
BUN: 17 mg/dL (ref 8–27)
Bilirubin Total: 0.5 mg/dL (ref 0.0–1.2)
CO2: 27 mmol/L (ref 20–29)
Calcium: 9.2 mg/dL (ref 8.7–10.3)
Chloride: 106 mmol/L (ref 96–106)
Creatinine, Ser: 0.85 mg/dL (ref 0.57–1.00)
Globulin, Total: 2.2 g/dL (ref 1.5–4.5)
Glucose: 88 mg/dL (ref 70–99)
Potassium: 4.7 mmol/L (ref 3.5–5.2)
Sodium: 144 mmol/L (ref 134–144)
Total Protein: 6.5 g/dL (ref 6.0–8.5)
eGFR: 68 mL/min/{1.73_m2} (ref >59)

## 2023-02-20 LAB — HEMOGLOBIN A1C
Est. average glucose Bld gHb Est-mCnc: 120 mg/dL
Hgb A1c MFr Bld: 5.8 % — ABNORMAL HIGH (ref 4.8–5.6)

## 2023-02-22 ENCOUNTER — Ambulatory Visit (HOSPITAL_COMMUNITY)
Admission: EM | Admit: 2023-02-22 | Discharge: 2023-02-22 | Disposition: A | Discharge status: Home / Self Care | Payer: Medicare Other

## 2023-02-22 ENCOUNTER — Encounter (HOSPITAL_COMMUNITY): Payer: Self-pay

## 2023-02-22 DIAGNOSIS — H1033 Unspecified acute conjunctivitis, bilateral: Secondary | ICD-10-CM | POA: Diagnosis not present

## 2023-02-22 MED ORDER — ERYTHROMYCIN 5 MG/GM OP OINT: TOPICAL_OINTMENT | Freq: Four times a day (QID) | OPHTHALMIC | 0 refills | Status: AC

## 2023-02-22 MED ORDER — EYE WASH OP SOLN
OPHTHALMIC | Status: AC
  Filled 2023-02-22: qty 118

## 2023-02-22 MED ORDER — FLUORESCEIN SODIUM 1 MG OP STRP
ORAL_STRIP | OPHTHALMIC | Status: AC
  Filled 2023-02-22: qty 1

## 2023-02-22 MED ORDER — TETRACAINE HCL 0.5 % OP SOLN
OPHTHALMIC | Status: AC
  Filled 2023-02-22: qty 4

## 2023-02-22 NOTE — Discharge Instructions (Signed)
Start using the erythromycin ointment in both eyes 4 times daily to help clear up any infection.  If symptoms do not improve with treatment, recommend follow-up with your eye doctor.

## 2023-02-22 NOTE — ED Provider Notes (Signed)
[facility]    CSN: [id number] Arrival date & time: 02/22/23  1228      History   Chief Complaint Chief Complaint  Patient presents with   Eye Problem    HPI Tamara Patterson is a 82 y.o. female.   Patient presents today with 3-day history of right eye irritation, itching, soreness, and redness that began this morning.  She endorses thick yellow-colored drainage coming from the eye and the eye is matted shut in the morning when she wakes up.  Uses prescribed eyedrops for dry eye, however this feels different than her normal dry.  She reports she had an eye infection a couple of years ago and used a leftover antibiotic ointment that seem to help a little bit, however but she ran out.  She denies contact lens use or foreign body sensation.  Reports now the left eye feels like it is also getting irritated.  History of bilateral cataract removal, follows with eye doctor regularly.    Past Medical History:  Diagnosis Date   Anticoagulant long-term use    CAD (coronary artery disease)    Depression    seasonal   Dyslipidemia    GERD (gastroesophageal reflux disease)    Giant cell arteritis (HCC)    per aortotomy biopsy in 1991   HTN (hypertension)    Hx of CABG 1991   LIMA-LAD, SVG-RCA, SVG-1st and 2ndOM   Panic anxiety syndrome    S/P AVR (aortic valve replacement)    with root grafting; #4mm  St. Jude valve conduit; Last Echo 2007    Patient Active Problem List   Diagnosis Date Noted   Seasonal depression (HCC) 11/29/2017   Aortic valve disorder 06/26/2010   Long term current use of anticoagulant 06/26/2010   Palpitations 04/24/2010   S/P St Jude AVR '91    Hx of CABG-'91    Dyslipidemia    HTN (hypertension)    Panic anxiety syndrome    Giant cell arteritis (HCC)     Past Surgical History:  Procedure Laterality Date   AORTIC VALVE REPLACEMENT     with root grafting   CARDIAC SURGERY     CHOLECYSTECTOMY     CORONARY ARTERY BYPASS GRAFT      OB  History   No obstetric history on file.      Home Medications    Prior to Admission medications   Medication Sig Start Date End Date Taking? Authorizing Provider  erythromycin ophthalmic ointment Place into both eyes 4 (four) times daily for 7 days. Place a 1/2 inch ribbon of ointment into the lower eyelid. 02/22/23 03/01/23 Yes Cathlean Marseilles A, NP  amLODipine (NORVASC) 5 MG tablet Take 1 tablet (5 mg total) by mouth daily. 02/19/23 05/20/23  [place], Peter M, MD  amoxicillin (AMOXIL) 500 MG capsule Take 2000 mg (4 capsules) 30-60 min prior to dental procedures. 04/10/21   [facility], Roe Rutherford, PA  Barberry-Oreg Grape-Goldenseal (BERBERINE COMPLEX PO) Take 2 tablets by mouth daily.    [provider]  CEQUA 0.09 % SOLN Apply 1 drop to eye 2 (two) times daily.    [provider]  Cholecalciferol (VITAMIN D3) 2000 units TABS Take 2,000 Units by mouth daily.    [provider]  clobetasol ointment (TEMOVATE) 0.05 % clobetasol 0.05 % topical ointment  APPLY OINTMENT TOPICALLY TO AFFECTED AREA TWICE DAILY    [provider]  escitalopram (LEXAPRO) 20 MG tablet Take 1 tablet (20 mg total) by mouth daily. 05/15/22  Cottle, Steva Ready., MD  rosuvastatin (CRESTOR) 5 MG tablet Take 1 tablet (5 mg total) by mouth 2 (two) times a week. Take one tablet twice a week. 11/18/22   Alver Sorrow, NP  warfarin (COUMADIN) 5 MG tablet TAKE 1 TO 1 & 1/2 (ONE & ONE-HALF) TABLETS BY MOUTH ONCE DAILY AS  DIRECTED  BY  COUMADIN  CLINIC 12/05/22   [place], Peter M, MD    Family History Family History  Problem Relation Age of Onset   Liver disease Mother    Pancreatic cancer Father     Social History Social History   Tobacco Use   Smoking status: Former   Smokeless tobacco: Never  Vaping Use   Vaping status: Never Used  Substance Use Topics   Alcohol use: No   Drug use: No     Allergies   Codeine, Lipitor [atorvastatin calcium], and Atenolol   Review of  Systems Review of Systems Per HPI  Physical Exam Triage Vital Signs ED Triage Vitals  Encounter Vitals Group     BP 02/22/23 1242 (!) 144/58     Systolic BP Percentile --      Diastolic BP Percentile --      Pulse Rate 02/22/23 1242 (!) 56     Resp 02/22/23 1242 16     Temp 02/22/23 1242 97.9 F (36.6 C)     Temp Source 02/22/23 1242 Oral     SpO2 02/22/23 1242 96 %     Weight 02/22/23 1242 146 lb (66.2 kg)     Height 02/22/23 1242 5\' 5"  (1.651 m)     Head Circumference --      Peak Flow --      Pain Score 02/22/23 1241 2     Pain Loc --      Pain Education --      Exclude from Growth Chart --    No data found.  Updated Vital Signs BP (!) 144/58 (BP Location: Left Arm)   Pulse (!) 56   Temp 97.9 F (36.6 C) (Oral)   Resp 16   Ht 5\' 5"  (1.651 m)   Wt 146 lb (66.2 kg)   SpO2 96%   BMI 24.30 kg/m   Visual Acuity Right Eye Distance:   Left Eye Distance:   Bilateral Distance:    Right Eye Near:   Left Eye Near:    Bilateral Near:     Physical Exam Vitals and nursing note reviewed.  Constitutional:      General: She is not in acute distress.    Appearance: Normal appearance.  HENT:     Head: Normocephalic and atraumatic.     Mouth/Throat:     Mouth: Mucous membranes are moist.     Pharynx: Oropharynx is clear. No oropharyngeal exudate or posterior oropharyngeal erythema.  Eyes:     General:        Right eye: Discharge present. No foreign body or hordeolum.        Left eye: No foreign body, discharge or hordeolum.     Extraocular Movements: Extraocular movements intact.     Right eye: Normal extraocular motion.     Left eye: Normal extraocular motion.     Conjunctiva/sclera:     Right eye: Right conjunctiva is injected. Exudate present.     Left eye: Left conjunctiva is not injected. No exudate.    Pupils: Pupils are equal, round, and reactive to light.  Pulmonary:     Effort: Pulmonary effort  is normal. No respiratory distress.  Musculoskeletal:      Cervical back: Normal range of motion.  Lymphadenopathy:     Cervical: No cervical adenopathy.  Skin:    General: Skin is warm and dry.     Capillary Refill: Capillary refill takes less than 2 seconds.     Coloration: Skin is not jaundiced or pale.     Findings: No erythema.  Neurological:     Mental Status: She is alert and oriented to person, place, and time.  Psychiatric:        Behavior: Behavior is cooperative.      UC Treatments / Results  Labs (all labs ordered are listed, but only abnormal results are displayed) Labs Reviewed - No data to display  EKG   Radiology No results found.  Procedures Procedures (including critical care time)  Medications Ordered in UC Medications - No data to display  Initial Impression / Assessment and Plan / UC Course  I have reviewed the triage vital signs and the nursing notes.  Pertinent labs & imaging results that were available during my care of the patient were reviewed by me and considered in my medical decision making (see chart for details).   Patient is well-appearing, normotensive, afebrile, not tachycardic, not tachypneic, oxygenating well on room air.    1. Acute bacterial conjunctivitis of both eyes Treat with erythromycin ointment 4 times a day for 7 days Other supportive care discussed Recommended follow-up with eye doctor if symptoms do not improve with treatment  The patient was given the opportunity to ask questions.  All questions answered to their satisfaction.  The patient is in agreement to this plan.   Final Clinical Impressions(s) / UC Diagnoses   Final diagnoses:  Acute bacterial conjunctivitis of both eyes     Discharge Instructions      Start using the erythromycin ointment in both eyes 4 times daily to help clear up any infection.  If symptoms do not improve with treatment, recommend follow-up with your eye doctor.    ED Prescriptions     Medication Sig Dispense Auth. Provider    erythromycin ophthalmic ointment Place into both eyes 4 (four) times daily for 7 days. Place a 1/2 inch ribbon of ointment into the lower eyelid. 3.5 g Valentino Nose, NP      PDMP not reviewed this encounter.   Valentino Nose, NP 02/22/23 1410

## 2023-02-22 NOTE — ED Triage Notes (Signed)
Patient here today with c/o right eye irritation/scratchy feeling, itching, and soreness X 3 days. Patient states that her left eye started feeling scratchy today. Patient has a h/o dry eye. Patient uses Cequa eye drops. She had a previous eye infection a couple years ago and tried using some antibiotic eye drops that she had left over. She used it for a couple nights.

## 2023-02-24 ENCOUNTER — Ambulatory Visit: Payer: Medicare Other | Attending: Cardiovascular Disease

## 2023-02-24 DIAGNOSIS — Z0000 Encounter for general adult medical examination without abnormal findings: Secondary | ICD-10-CM

## 2023-02-24 NOTE — Progress Notes (Signed)
Appointment Outcome: Completed, Session #: 3                   Start time: 8:31am   End time: 8:56am   Total Mins: 25 minutes  AGREEMENTS SECTION   Overall Goal(s): Decrease A1c to <5.7 over the next three months by gradually reducing daily added sugar intake to 25 grams per day and increasing daily water intake to at least 64 oz. Exercise 150 minutes per week Improve sleep hygiene (will continue to discuss if patient determines she needs support around this goal)                                  Agreement/Action Steps: Reduce daily added sugar intake to 25 grams Read food labels to calculate sugar content in foods Practice portion control per serving on food label Eat dinner by 6pm nightly by starting cooking around 4pm Reduce snacking after 7pm by drinking water and/or engaging in an activity of choice Eat a small piece of fruit that has a low glycemic index   Increase daily water intake Aim daily to drink 48-64 oz of water Use water bottle to track water consumption   Exercise 150 minutes per week Walk or ride bike 30 minutes per day 5 days/week Can be in 10-minute increments throughout the day   Improve sleep hygiene Set bedtime for 10:30pm-11:00pm   Progress Notes:  Patient reported that as of 12/5 her A1c is down to 5.8 from 5.9. Patient stated that the results have been encouraging her to continue implementing her action steps because it shows that they have been effective. Patient shared that she is striving to perfect the implementation of her action steps because she finds herself falling back at times in each area but can get back into the routine when those moments occur.   Patient mentioned that she continues to monitor her sugar intake by limiting when she does eat sweets such as on Sundays at church (e.g., slice of cake). Patient stated that when she has a craving for something sweet, she chooses products with zero sugar added (e.g., ice cream or cookies) or drink an  ICE beverage with zero sugar. Patient shared that she has been able to identify these types of products by reading the food label and choosing those with the less amount of total sugar.   Patient stated that reading food labels has been helpful in practicing portion control. Patient shared that she has noticed that she is eating less food since implementing this action step. Patient expressed that there are times when she eats a light meal that includes a protein and vegetables depending on the time of the evening, (e.g., getting home after 11pm from prayer service). Patient mentioned that the times that she has a challenge with eating by 6pm is when she gets home after 6pm, is traveling, or have not prepped food before leaving home so that the meal is readily available to consume.  Patient shared that she engages in little to no snacking. Patient stated that she normally eats a small apple and occasionally she will count out 2-3 chips if she has some available. Patient stated that she does not eat chips regularly and read the food label to determine the serving size. Patient stated that she is drinking approximately 3 (16.9 oz) water bottles per day. Patient expressed that she wants to consume more but is challenged by waking up  late and forgetting to start drinking water in the morning due to being focused on tasks and other responsibilities. Patient shared that when she forgets to start drinking water in the morning, she finds herself trying to increase her water consumption during the evening before bed.   Patient explained that she has not engaged in riding her stationary bike or walk for 30 minutes as planned. Patient shared that she has been focusing on increasing her daily step count to 5,000 steps. Patient reported that she does walk between 2,000-3,000 steps per day and is working on increasing her steps. Patient reported that the only times that she finds it challenging to stick to her bedtime is  when she is traveling or get home late from church. Patient shared how she sets her intention to be in the bed each night by 10:30pm-11pm that she can make it a habit.    Indicators of Success and Accountability:  Patient's A1c decreased from 5.9 to 5.8 per her labs on 12/5.  Readiness: Patient is in the action stage of decreasing her A1c by reducing sugar intake and increasing water intake, along with improving sleep hygiene. Patient is in the preparation stage of exercising 150 minutes per week.   Strengths and Supports: Patient is relying on being flexible and steadfast.   Challenges and Barriers: Patient is involved in programs that end late in the evenings and forgets to start drinking water in the morning.   Coaching Outcomes: Patient discussed how she plans to increase her daily step count by getting up every hour to do some type of standing work and walking around in her home to increase her daily step count to 5,000.  Patient discussed setting a reminder on her phone or downloading an app to help her track her water consumption and to hold her accountable.  Patient will continue to implement all other action steps as is.   Increase daily water intake Aim daily to drink 48-64 oz of water Use water bottle to track water consumption Set reminder on phone or download app to aid in remembering to drink water in the morning   Increase daily steps to 5,000 Engage in activities/work that involves standing and walking around at home.    Attempted: Fulfilled - Patient has continued to read food labels to monitor sugar intake and practice portion control. Patient has reduced snack after 7pm and eating a small piece of fruit. Patient has been able to maintain drinking a minimum of 48 oz of water per day.  Partial - Patient was able to maintain eating dinner by 6pm and bedtime between 10:30-11pm except for when traveling and on church nights.  Not met - Patient did not walk or ride  bike for 30 minutes/5 days per week.

## 2023-03-04 ENCOUNTER — Telehealth: Payer: Self-pay | Admitting: *Deleted

## 2023-03-04 ENCOUNTER — Ambulatory Visit: Payer: Medicare Other

## 2023-03-04 NOTE — Telephone Encounter (Signed)
Called pt since she missed her Anticoagulation Appt today at 945am; there was no answer so left a message to call back to reschedule missed appt.

## 2023-03-10 ENCOUNTER — Telehealth: Payer: Self-pay

## 2023-03-10 ENCOUNTER — Ambulatory Visit: Payer: Medicare Other | Attending: Cardiology

## 2023-03-10 DIAGNOSIS — Z0000 Encounter for general adult medical examination without abnormal findings: Secondary | ICD-10-CM

## 2023-03-10 NOTE — Telephone Encounter (Signed)
Called patient to hold health coaching session over the phone. Patient did not answer. Left message for patient to return call to hold session today or to reschedule if necessary.   Renaee Munda, MS, ERHD, [facility]  Care Guide, Health & Wellness Coach [address] [place] [address] Telephone: [phone] Email: [email or web address]

## 2023-03-18 ENCOUNTER — Ambulatory Visit: Payer: Medicare Other | Attending: Cardiovascular Disease | Admitting: *Deleted

## 2023-03-18 DIAGNOSIS — Z7901 Long term (current) use of anticoagulants: Secondary | ICD-10-CM

## 2023-03-18 DIAGNOSIS — I359 Nonrheumatic aortic valve disorder, unspecified: Secondary | ICD-10-CM

## 2023-03-18 LAB — POCT INR: INR: 2.8 (ref 2.0–3.0)

## 2023-03-18 NOTE — Patient Instructions (Signed)
 Description   Continue taking 5mg  daily EXCEPT 7.5mg  on Mondays, Wednesdays and Fridays.  Stay consistent with greens each week.  Repeat INR in 6 weeks. [facility] # [phone]

## 2023-04-17 ENCOUNTER — Other Ambulatory Visit: Payer: Self-pay | Admitting: Psychiatry

## 2023-04-17 DIAGNOSIS — F4001 Agoraphobia with panic disorder: Secondary | ICD-10-CM

## 2023-04-17 DIAGNOSIS — F338 Other recurrent depressive disorders: Secondary | ICD-10-CM

## 2023-04-30 ENCOUNTER — Ambulatory Visit: Payer: Medicare Other

## 2023-05-05 ENCOUNTER — Ambulatory Visit: Payer: Medicare Other | Attending: Cardiology | Admitting: *Deleted

## 2023-05-05 DIAGNOSIS — I359 Nonrheumatic aortic valve disorder, unspecified: Secondary | ICD-10-CM | POA: Diagnosis not present

## 2023-05-05 DIAGNOSIS — Z7901 Long term (current) use of anticoagulants: Secondary | ICD-10-CM

## 2023-05-05 LAB — POCT INR: INR: 3.8 — AB (ref 2.0–3.0)

## 2023-05-05 NOTE — Patient Instructions (Addendum)
 Description   Do not take any warfarin today then continue taking 5mg  daily EXCEPT 7.5mg  on Mondays, Wednesdays and Fridays.  Stay consistent with greens each week.  Repeat INR in 4 weeks. Coumadin Clinic # [phone]

## 2023-05-19 ENCOUNTER — Ambulatory Visit: Payer: Medicare Other | Admitting: Psychiatry

## 2023-05-19 ENCOUNTER — Encounter: Payer: Self-pay | Admitting: Psychiatry

## 2023-05-19 DIAGNOSIS — F338 Other recurrent depressive disorders: Secondary | ICD-10-CM | POA: Diagnosis not present

## 2023-05-19 DIAGNOSIS — F4001 Agoraphobia with panic disorder: Secondary | ICD-10-CM | POA: Diagnosis not present

## 2023-05-19 MED ORDER — ESCITALOPRAM OXALATE 20 MG PO TABS: 20.0000 mg | ORAL_TABLET | Freq: Every day | ORAL | 3 refills | Status: AC

## 2023-05-19 NOTE — Progress Notes (Signed)
 Tamara Patterson [id number] [birth year] 83 y.o.    Subjective:   Patient ID:  Tamara Patterson is a 83 y.o. (DOB March 27, 1940) female.  Chief Complaint:  Chief Complaint  Patient presents with   Follow-up   Anxiety   Depression    HPI KASSIDIE HENDRIKS presents to the office today for follow-up of panic disorder with agoraphobia, seasonal depression, and history of a fear of disease or phobia of disease.  seen March 2019 & 02/2019.  No meds were changed.  She was can encouraged to use her light box in the winter for seasonal depression and to maintain adequate levels of vitamin D.  03/21/2020 appt with following noted: Remains on Lexapro 20 mg daily and no other psych meds. Done very well with mood and anxiety.  Still isolated.  No panic. Little tics or tremors in mouth.  Sort of like a jerk.  Not really bothering her.  Had for a couple of years and comes and goes and read about it. Plan: use lightbox Continue Lexapro 20 mg daily vs reduce the dosage bc tics and tremors.  Rec not reduce until Spring.   April reduce to 15 mg for a couple of months to see if this is better.  Disc risk relapse.  They may not be related.  Rec she discussed this with her primary care doc when she sees the doc soon.  It is not typical for SSRIs to cause tremors or tics but it is not impossible.  12/21/2020 appt noted: Stayed on Lexapro 20 mg daily.  Tremor are not real bad but bothersome at times in hands. Moood good so far bbut seasonal downturn likely.  Then lightbox usually helps.  No panic or unusual anxiety. No SE Joined fitness center and doing yoga Patient reports stable mood and denies depressed or irritable moods.  Patient denies any recent difficulty with anxiety.  Patient denies difficulty with sleep initiation or maintenance. Denies appetite disturbance.  Patient reports that energy and motivation have been good.  Patient denies any difficulty with concentration.  Patient denies any suicidal ideation.  06/28/21  appt noted: Fine without panic. Still tremor.  Can affect handwriting.  Otherwise not that much of a problem.   Didn't need lightbox this winter and without depression. Hx seasonal dep worse Jan.  05/15/22 appt noted: Doing ok.  Very mild seasonal dep. No panic or anxiety. Didn't need light box this winter. Sleeping good and appetitie good. Tremor seems worse to her.  Not disc with PCP. No caffeine.   Exercises.    05/19/23 appt noted: Med:  Lexapro 20 mg daily SP recent Covid.  Not severe. Doing very well with mental health.  No panic nor depression.  No seasonal dep.  Didn't have to use light box.   Se tremor about the same.  Affects writing.   Sleep good and appetite good. No new concerns.    Past psych med trials include citalopram 40, Lexapro 20, light box, trazodone no response, mirtazapine  Review of Systems:  Review of Systems  Musculoskeletal:  Positive for arthralgias. Negative for joint swelling.  Neurological:  Positive for tremors.  Psychiatric/Behavioral:  Negative for dysphoric mood. The patient is not nervous/anxious.     Medications: I have reviewed the patient's current medications.  Current Outpatient Medications  Medication Sig Dispense Refill   amLODipine (NORVASC) 5 MG tablet Take 1 tablet (5 mg total) by mouth daily. 180 tablet 3   Barberry-Oreg Grape-Goldenseal (BERBERINE COMPLEX PO) Take 2  tablets by mouth daily.     CEQUA 0.09 % SOLN Apply 1 drop to eye 2 (two) times daily.     Cholecalciferol (VITAMIN D3) 2000 units TABS Take 2,000 Units by mouth daily.     clobetasol ointment (TEMOVATE) 0.05 % clobetasol 0.05 % topical ointment  APPLY OINTMENT TOPICALLY TO AFFECTED AREA TWICE DAILY     escitalopram (LEXAPRO) 20 MG tablet Take 1 tablet (20 mg total) by mouth daily. 90 tablet 3   rosuvastatin (CRESTOR) 5 MG tablet Take 1 tablet (5 mg total) by mouth 2 (two) times a week. Take one tablet twice a week. 30 tablet 3   warfarin (COUMADIN) 5 MG tablet TAKE  1 TO 1 & 1/2 (ONE & ONE-HALF) TABLETS BY MOUTH ONCE DAILY AS  DIRECTED  BY  COUMADIN  CLINIC 120 tablet 1   No current facility-administered medications for this visit.    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 Jude valve conduit; Last Echo 2007    Family History  Problem Relation Age of Onset   Liver disease Mother    Pancreatic cancer Father     Social History   Socioeconomic History   Marital status: Married    Spouse name: Not on file   Number of children: 0   Years of education: Not on file   Highest education level: Not on file  Occupational History   Occupation: social work    Associate Professor: RETIRED    Comment: retired  Tobacco Use   Smoking status: Former   Smokeless tobacco: Never  Advertising account planner   Vaping status: Never Used  Substance and Sexual Activity   Alcohol use: No   Drug use: No   Sexual activity: Not on file  Other Topics Concern   Not on file  Social History Narrative   Not on file   Social Drivers of Health   Financial Resource Strain: Not on file  Food Insecurity: Not on file  Transportation Needs: Not on file  Physical Activity: Not on file  Stress: Not on file  Social Connections: Not on file  Intimate Partner Violence: Not on file    Past Medical History, Surgical history, Social history, and Family history were reviewed and updated as  appropriate.   Please see review of systems for further details on the patient's review from today.   Objective:   Physical Exam:  There were no vitals taken for this visit.  Physical Exam Constitutional:      General: She is not in acute distress. Musculoskeletal:        General: No deformity.  Neurological:     Mental Status: She is alert and oriented to person, place, and time.     Cranial Nerves: No dysarthria.  Coordination: Coordination normal.     Comments: Fine intention tremor  Psychiatric:        Attention and Perception: Attention and perception normal. She does not perceive auditory or visual hallucinations.        Mood and Affect: Mood normal. Mood is not anxious or depressed. Affect is not labile, blunt or inappropriate.        Speech: Speech normal.        Behavior: Behavior normal. Behavior is cooperative.        Thought Content: Thought content normal. Thought content is not paranoid or delusional. Thought content does not include homicidal or suicidal ideation. Thought content does not include suicidal plan.        Cognition and Memory: Cognition and memory normal.        Judgment: Judgment normal.     Comments: Anxiety managed.     Lab Review:     Component Value Date/Time   NA 144 02/19/2023 0840   K 4.7 02/19/2023 0840   CL 106 02/19/2023 0840   CO2 27 02/19/2023 0840   GLUCOSE 88 02/19/2023 0840   GLUCOSE 111 (H) 10/10/2016 0828   BUN 17 02/19/2023 0840   CREATININE 0.85 02/19/2023 0840   CREATININE 0.82 05/02/2015 1020   CALCIUM 9.2 02/19/2023 0840   PROT 6.5 02/19/2023 0840   ALBUMIN 4.3 02/19/2023 0840   AST 24 02/19/2023 0840   ALT 18 02/19/2023 0840   ALKPHOS 118 02/19/2023 0840   BILITOT 0.5 02/19/2023 0840   GFRNONAA 67 11/17/2019 0907   GFRAA 77 11/17/2019 0907       Component Value Date/Time   WBC 4.9 02/19/2023 0840   WBC 5.5 10/10/2016 0828   RBC 4.69 02/19/2023 0840   RBC 5.23 (H) 10/10/2016 0828   HGB 12.7 02/19/2023  0840   HCT 40.7 02/19/2023 0840   PLT 221 02/19/2023 0840   MCV 87 02/19/2023 0840   MCH 27.1 02/19/2023 0840   MCH 26.4 10/10/2016 0828   MCHC 31.2 (L) 02/19/2023 0840   MCHC 32.4 10/10/2016 0828   RDW 13.4 02/19/2023 0840   LYMPHSABS 2.2 02/19/2023 0840   MONOABS 0.7 04/13/2013 1057   EOSABS 0.1 02/19/2023 0840   BASOSABS 0.0 02/19/2023 0840    No results found for: "POCLITH", "LITHIUM"   No results found for: "PHENYTOIN", "PHENOBARB", "VALPROATE", "CBMZ"   .res Assessment: Plan:    Chirstina was seen today for follow-up, anxiety and depression.  Diagnoses and all orders for this visit:  Panic disorder with agoraphobia -     escitalopram (LEXAPRO) 20 MG tablet; Take 1 tablet (20 mg total) by mouth daily.  Seasonal depression (HCC) -     escitalopram (LEXAPRO) 20 MG tablet; Take 1 tablet (20 mg total) by mouth daily.     30 min face to face time with patient was spent on counseling and coordination of care. We discussed dxes and seasonality and timing of meds and relapse risk without and with meds.  Disc SE in detail and SSRI withdrawal sx.  option light therapy in Nov if worsening seasonal depression.  Worked well for her in the past.  Replace bulbs.   Discussed the limited lifetime usefulness of fluorescent bulbs in light box is used to treat seasonal affective disorder.  She has never replaced her light box bulbs She has not needed to use lightbox in last couple of years.  Continue Lexapro 20 mg daily vs reduce the dosage bc tics and tremors.  Disc risk relapse.  They may not be related.  Rec she discussed this with her primary care doc when she sees the doc soon.  It is not typical for SSRIs to cause tremors or tics but it is not impossible.  Option propranolol prn tremor.    She agrees.   FU 12 mos  Meredith Staggers, MD, DFAPA    Please see After Visit Summary for patient specific instructions.  Future Appointments  Date Time Provider Department Center   06/02/2023  9:15 AM [facility] None     No orders of the defined types were placed in this encounter.   -------------------------------

## 2023-06-02 ENCOUNTER — Ambulatory Visit: Payer: Medicare Other | Attending: Cardiology

## 2023-06-02 DIAGNOSIS — Z7901 Long term (current) use of anticoagulants: Secondary | ICD-10-CM

## 2023-06-02 DIAGNOSIS — I359 Nonrheumatic aortic valve disorder, unspecified: Secondary | ICD-10-CM | POA: Diagnosis not present

## 2023-06-02 LAB — POCT INR: INR: 2.6 (ref 2.0–3.0)

## 2023-06-02 NOTE — Patient Instructions (Signed)
 continue taking 5mg  daily EXCEPT 7.5mg  on Mondays, Wednesdays and Fridays.  Stay consistent with greens each week.  Repeat INR in 4 weeks. [facility] # [phone]

## 2023-07-02 ENCOUNTER — Ambulatory Visit: Attending: Cardiology

## 2023-07-02 DIAGNOSIS — I359 Nonrheumatic aortic valve disorder, unspecified: Secondary | ICD-10-CM

## 2023-07-02 DIAGNOSIS — Z7901 Long term (current) use of anticoagulants: Secondary | ICD-10-CM | POA: Diagnosis not present

## 2023-07-02 LAB — POCT INR: INR: 3.4 — AB (ref 2.0–3.0)

## 2023-07-02 NOTE — Patient Instructions (Signed)
 Description   HOLD today's dose and then continue taking 5mg  daily EXCEPT 7.5mg  on Mondays, Wednesdays and Fridays.  Stay consistent with greens each week.  Repeat INR in 4 weeks. [facility] # [phone]

## 2023-07-30 ENCOUNTER — Other Ambulatory Visit: Payer: Self-pay | Admitting: Cardiology

## 2023-07-30 ENCOUNTER — Other Ambulatory Visit: Payer: Self-pay

## 2023-07-30 ENCOUNTER — Ambulatory Visit: Attending: Cardiology

## 2023-07-30 DIAGNOSIS — Z7901 Long term (current) use of anticoagulants: Secondary | ICD-10-CM

## 2023-07-30 DIAGNOSIS — Z952 Presence of prosthetic heart valve: Secondary | ICD-10-CM

## 2023-07-30 DIAGNOSIS — I359 Nonrheumatic aortic valve disorder, unspecified: Secondary | ICD-10-CM | POA: Diagnosis not present

## 2023-07-30 LAB — POCT INR: INR: 2.8 (ref 2.0–3.0)

## 2023-07-30 MED ORDER — WARFARIN SODIUM 5 MG PO TABS
ORAL_TABLET | ORAL | 1 refills | Status: DC
Start: 2023-07-30 — End: 2023-10-09

## 2023-07-30 NOTE — Telephone Encounter (Signed)
 Warfarin 5mg  refill Aortic valve disorder  Last INR 07/30/23 Last OV 02/19/23

## 2023-07-30 NOTE — Patient Instructions (Signed)
Description   Continue taking 5mg  daily EXCEPT 7.5mg  on Mondays, Wednesdays and Fridays.  Stay consistent with greens each week.  Repeat INR in 5 weeks. [facility] # [phone]

## 2023-09-03 ENCOUNTER — Ambulatory Visit: Attending: Cardiology | Admitting: *Deleted

## 2023-09-03 DIAGNOSIS — Z7901 Long term (current) use of anticoagulants: Secondary | ICD-10-CM | POA: Diagnosis not present

## 2023-09-03 DIAGNOSIS — I359 Nonrheumatic aortic valve disorder, unspecified: Secondary | ICD-10-CM

## 2023-09-03 LAB — POCT INR: INR: 3 (ref 2.0–3.0)

## 2023-09-03 NOTE — Patient Instructions (Signed)
 Description   Continue taking 5mg  daily EXCEPT 7.5mg  on Mondays, Wednesdays and Fridays.  Stay consistent with greens each week.  Repeat INR in 6 weeks. [facility] # [phone]

## 2023-09-07 NOTE — Therapy (Signed)
 OUTPATIENT PHYSICAL THERAPY THORACOLUMBAR EVALUATION   Patient Name: Tamara Patterson MRN: [id number] DOB:11-19-1940, 83 y.o., female Today's Date: 09/08/2023  END OF SESSION:  PT End of Session - 09/08/23 1154     Visit Number 1    Number of Visits 6    Date for PT Re-Evaluation 11/03/23    Authorization Type [employer] MCR    PT Start Time 1154    PT Stop Time 1232    PT Time Calculation (min) 38 min    Activity Tolerance Patient tolerated treatment well    Behavior During Therapy WFL for tasks assessed/performed          Past Medical History:  Diagnosis Date   Anticoagulant long-term use    CAD (coronary artery disease)    Depression    seasonal   Dyslipidemia    GERD (gastroesophageal reflux disease)    Giant cell arteritis (HCC)    per aortotomy biopsy in 1991   HTN (hypertension)    Hx of CABG 1991   LIMA-LAD, SVG-RCA, SVG-1st and 2ndOM   Panic anxiety syndrome    S/P AVR (aortic valve replacement)    with root grafting; #80mm  St. Jude valve conduit; Last Echo 2007   Past Surgical History:  Procedure Laterality Date   AORTIC VALVE REPLACEMENT     with root grafting   CARDIAC SURGERY     CHOLECYSTECTOMY     CORONARY ARTERY BYPASS GRAFT     Patient Active Problem List   Diagnosis Date Noted   Seasonal depression (HCC) 11/29/2017   Aortic valve disorder 06/26/2010   Long term current use of anticoagulant 06/26/2010   Palpitations 04/24/2010   S/P St Jude AVR '91    Hx of CABG-'91    Dyslipidemia    HTN (hypertension)    Panic anxiety syndrome    Giant cell arteritis (HCC)     PCP: Sun, Vyvyan, MD   REFERRING PROVIDER: Ishmael Slough, MD   REFERRING DIAG: M54.50  Low Back pain at multiple sites  Rationale for Evaluation and Treatment: Rehabilitation  THERAPY DIAG:  Other low back pain  ONSET DATE: one month ago  SUBJECTIVE:                                                                                                                                                                                            SUBJECTIVE STATEMENT: I'm having some increased back pain and I'm stiff. I can't do things as quickly as I could before like rolling over in bed, getting in/out of car and standing up.   PERTINENT HISTORY:   CAD, HTN  PAIN:  Are you having pain? Yes: NPRS scale: 0/10 up to 4/10 Pain location: low back (sometimes catches on the R) Pain description: achy soreness Aggravating factors: sitting a long time, first thing in the morning Relieving factors: moving around  PRECAUTIONS: None  RED FLAGS: None   WEIGHT BEARING RESTRICTIONS: No  FALLS:  Has patient fallen in last 6 months? Yes. Number of falls 1 slipped on a rug in the house and lightly sprained her L ankle.(Two weeks ago)  LIVING ENVIRONMENT: Lives with: lives with their spouse Lives in: House/apartment Stairs: No Has following equipment at home: None  OCCUPATION: retired  PLOF: Independent  PATIENT GOALS: learn how to stretch and be more flexible  NEXT MD VISIT: none scheduled  OBJECTIVE:  Note: Objective measures were completed at Evaluation unless otherwise noted.  DIAGNOSTIC FINDINGS:  none  PATIENT SURVEYS:  The Patient-Specific Functional Scale  Initial:  I am going to ask you to identify up to 3 important activities that you are unable to do or are having difficulty with as a result of this problem.  Today are there any activities that you are unable to do or having difficulty with because of this?  (Patient shown scale and patient rated each activity)  Follow up: When you first came in you had difficulty performing these activities.  Today do you still have difficulty?  Patient-Specific activity scoring scheme (Point to one number):  0 1 2 3 4 5 6 7 8 9  10 Unable                                                                                                          Able to perform To perform                                                                                                     activity at the same Activity         Level as before  Injury or problem  Activity       Getting up from bed in the morning                                                                          Initial:           4-5            follow up:  2.        Getting in/out of car                                                                         Initial:          4-5             follow up:  3.          Sit to stand                                                                        Initial:         4-5              follow up:      COGNITION: Overall cognitive status: Within functional limits for tasks assessed      MUSCLE LENGTH:  HS R>L  piriformis L>R  POSTURE: No Significant postural limitations   PALPATION: Left gluteals and piriformis and lumbar TTP  LUMBAR ROM:  WFL pain with all motions except rotation  LOWER EXTREMITY ROM:   WFL for tasks assessed  LOWER EXTREMITY MMT:  5/5 except hip flex 4+/5  FUNCTIONAL TESTS:  5 times sit to stand: 12.48 sec    TREATMENT DATE:                                                                                                                               09/08/23  See pt ed and HEP    PATIENT EDUCATION:  Education details: PT eval findings, anticipated POC, and initial HEP   Person educated: Patient Education method: Explanation, Demonstration, Tactile cues, Verbal cues, and Handouts Education comprehension: verbalized understanding and returned demonstration  HOME  EXERCISE PROGRAM: Access Code: [email or web address] URL: [email or web address] Date: 09/08/2023 Prepared by: Mliss  Exercises - Supine Bridge  - 1 x daily - 7 x weekly - 1-3 sets - 10 reps - Supine Lower Trunk Rotation  - 1 x daily - 7 x weekly - 1 sets - 5-10 reps - Supine Posterior  Pelvic Tilt  - 1 x daily - 7 x weekly - 1-2 sets - 10 reps - Seated Figure 4 Piriformis Stretch  - 2 x daily - 7 x weekly - 1 sets - 3 reps - 30 sec hold - Sit to Stand with Arms Crossed  - 3 x daily - 7 x weekly - 1 sets - 5-10 reps - Seated Pelvic Tilts  - 3 x daily - 7 x weekly - 1-2 sets - 10 reps  ASSESSMENT:  CLINICAL IMPRESSION: Patient is a 83 y.o. female who was seen today for physical therapy evaluation and treatment for low back pain which has worsened in the past month and is affecting her ability to transition from bed, chairs and the car. She demonstrates decreased flexibility in her hips and low back and weakness in B hip flexors.  She has pain with all lumbar motions except rotation as well as with bed mobility. She will benefit from skilled PT to address these deficits.    OBJECTIVE IMPAIRMENTS: decreased mobility, decreased strength, increased muscle spasms, impaired flexibility, and pain.   ACTIVITY LIMITATIONS: bending, standing, transfers, bed mobility, and locomotion level  PARTICIPATION LIMITATIONS: painful but not limited  PERSONAL FACTORS: Age and 1 comorbidity: HTN are also affecting patient's functional outcome.   REHAB POTENTIAL: Excellent  CLINICAL DECISION MAKING: Stable/uncomplicated  EVALUATION COMPLEXITY: Low   GOALS: Goals reviewed with patient? Yes  SHORT TERM GOALS: Target date: 09/29/2023   Patient will be independent with initial HEP.  Baseline:  Goal status: INITIAL  2.  Decreased pain in low back with transitional movements by 30%.  Baseline:  Goal status: INITIAL   LONG TERM GOALS: Target date: 11/03/2023   Patient will be independent with advanced/ongoing HEP to improve outcomes and carryover.  Baseline:  Goal status: INITIAL  2.  Patient will report 75% improvement in low back pain with sit to stands, getting up from bed in the morning and car transfers. Baseline:  Goal status: INITIAL  3.  Patient will demonstrate functional  pain free lumbar ROM to perform ADLs.   Baseline:  Goal status: INITIAL  4.  Patient will improve 2-3 points or better on the PSFS showing functional improvement   Baseline: 4-5/10 Goal status: INITIAL  6.  Patient able to move in bed at previous functional level. Baseline:  Goal status: INITIAL   PLAN:  PT FREQUENCY: 1x/week  PT DURATION: 6 sessions  PLANNED INTERVENTIONS: 97164- PT Re-evaluation, 97110-Therapeutic exercises, 97530- Therapeutic activity, [id number]- Neuromuscular re-education, 97535- Self Care, [id number]- Manual therapy, [id number]- Electrical stimulation (unattended), [phone]- Traction (mechanical), 20560 (1-2 muscles), 20561 (3+ muscles)- Dry Needling, Patient/Family education, Joint mobilization, Spinal mobilization, Cryotherapy, and Moist heat.  PLAN FOR NEXT SESSION: work on hip flexibility (piriformis/HS), lumbar mobility in supine and sitting, transitional movement/bed mobility   Mliss Cummins, PT 09/08/23 4:52 PM

## 2023-09-08 ENCOUNTER — Other Ambulatory Visit: Payer: Self-pay

## 2023-09-08 ENCOUNTER — Encounter: Payer: Self-pay | Admitting: Physical Therapy

## 2023-09-08 ENCOUNTER — Ambulatory Visit: Attending: Rheumatology | Admitting: Physical Therapy

## 2023-09-08 DIAGNOSIS — M5459 Other low back pain: Secondary | ICD-10-CM | POA: Diagnosis present

## 2023-10-02 ENCOUNTER — Encounter

## 2023-10-07 ENCOUNTER — Telehealth: Payer: Self-pay | Admitting: Cardiology

## 2023-10-07 NOTE — Telephone Encounter (Signed)
 Called and spoke with pt. I educated pt on the importance of monitoring her INR very closely if she does choose to start this multivitamin that contains VitK. I encouraged pt to look for multivitamins that do not contain VitK. Pt states she will probably NOT start this supplement now that she understands how it will affect her INR and interact with her Warfarin; however if she does start, she will start taking within 2-3 days and make coumadin  clinic nurse aware when she comes to her appt next week on 10/15/23.

## 2023-10-07 NOTE — Telephone Encounter (Signed)
 Called pt to discuss vitamin. Pt did not answer. Left message on voicemail with c/b number.

## 2023-10-07 NOTE — Telephone Encounter (Signed)
 Pt c/o medication issue:  1. Name of Medication:  warfarin (COUMADIN ) 5 MG tablet  2. How are you currently taking this medication (dosage and times per day)?  7.5 MG 3x/week 5 MG 4x/week  3. Are you having a reaction (difficulty breathing--STAT)?   4. What is your medication issue?   Patient says she is interested in taking a multivitamin for energy. She would like to know if she can take a blood thinner that contains vitamin K while on blood thinner. Please advise.

## 2023-10-07 NOTE — Telephone Encounter (Signed)
 Called and spoke with pt. Please refer to other telephone encounter.

## 2023-10-07 NOTE — Telephone Encounter (Signed)
 Pt would like to know if ok to take an overall vitamin that contains  Vitamin K. Requesting cb

## 2023-10-07 NOTE — Telephone Encounter (Signed)
 Spoke to patient sooner appointment scheduled with Dr.Jordan 7/24 at 9:00 am.

## 2023-10-08 NOTE — Progress Notes (Unsigned)
 Tamara Patterson Date of Birth: 1940-09-01   History of Present Illness: Tamara Patterson is seen today for followup. 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 This included mechanical aortic valve replacement and grafting of the aortic root with a #23 mm St. Jude valve conduit. She also had coronary bypass surgery including an IMA graft to the LAD, saphenous vein graft to the right coronary, and saphenous vein graft to the first and second obtuse marginal vessels. 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 She was treated with pulse steroids for a period of time and was weaned off of this. Last Myoview  study was in January 2018 and was normal. Echo in 2011 was satisfactory.  She has been intolerant of statins and Zetia  due to myalgias. Also intolerant of Welchol  due to hypoglycemia.  Echo in Jan 2023 showed normal LV function and normal prosthetic AV function.  On follow up today she is doing well. Energy level is good.  She is walking and cycling regularly. No chest pain, dyspnea, palpitations, dizziness.  No edema. No bleeding. BP has been well controlled.   Current Outpatient Medications on File Prior to Visit  Medication Sig Dispense Refill   amLODipine  (NORVASC ) 5 MG tablet Take 1 tablet (5 mg total) by mouth daily. 180 tablet 3   Barberry-Oreg Grape-Goldenseal (BERBERINE COMPLEX PO) Take 2 tablets by mouth daily.     CEQUA 0.09 % SOLN Apply 1 drop to eye 2 (two) times daily.     Cholecalciferol (VITAMIN D3) 2000 units TABS Take 2,000 Units by mouth daily.     clobetasol ointment (TEMOVATE) 0.05 % clobetasol 0.05 % topical ointment  APPLY OINTMENT TOPICALLY TO AFFECTED AREA TWICE DAILY (Patient taking differently: Apply 1 Application topically as needed.)     escitalopram  (LEXAPRO ) 20 MG tablet Take 1 tablet (20 mg total) by mouth daily. 90 tablet 3   rosuvastatin  (CRESTOR ) 5 MG tablet Take 1 tablet (5 mg  total) by mouth 2 (two) times a week. Take one tablet twice a week. 30 tablet 3   warfarin (COUMADIN ) 5 MG tablet TAKE 1 TO 1 & 1/2 (ONE & ONE-HALF) TABLETS BY MOUTH ONCE DAILY AS  DIRECTED  BY  COUMADIN   CLINIC 120 tablet 1   No current facility-administered medications on file prior to visit.    Allergies  Allergen Reactions   Codeine Nausea Only   Lipitor [Atorvastatin Calcium ] Other (See Comments)    Caused muscle aching   Atenolol Other (See Comments)    cramps    Past Medical History:  Diagnosis Date   Anticoagulant long-term use    CAD (coronary artery disease)    Depression    seasonal   Dyslipidemia    GERD (gastroesophageal reflux disease)    Giant cell arteritis (HCC)    per aortotomy biopsy in 1991   HTN (hypertension)    Hx of CABG 1991   LIMA-LAD, SVG-RCA, SVG-1st and 2ndOM   Panic anxiety syndrome    S/P AVR (aortic valve replacement)    with root grafting; #37mm  St. Jude valve conduit; Last Echo 2007    Past Surgical History:  Procedure Laterality Date   AORTIC VALVE REPLACEMENT     with root grafting   CARDIAC SURGERY     CHOLECYSTECTOMY     CORONARY ARTERY BYPASS GRAFT      Social History   Tobacco Use  Smoking Status Former  Smokeless  Tobacco Never    Social History   Substance and Sexual Activity  Alcohol Use No    Family History  Problem Relation Age of Onset   Liver disease Mother    Pancreatic cancer Father     Review of Systems: As noted in history of present illness. All other systems were reviewed and are negative.  Physical Exam: BP (!) 112/56 (BP Location: Right Arm, Patient Position: Sitting, Cuff Size: Normal)   Pulse 68   Ht 5' 5 (1.651 m)   Wt 153 lb (69.4 kg)   BMI 25.46 kg/m  GENERAL:  Well appearing BF in NAD HEENT:  PERRL, EOMI, sclera are clear. Oropharynx is clear. NECK:  No jugular venous distention, carotid upstroke brisk and symmetric, no bruits, no thyromegaly or adenopathy LUNGS:  Clear to  auscultation bilaterally CHEST:  Unremarkable HEART:  RRR,  PMI not displaced or sustained,good mechanical AV click, no S3, no S4: no clicks, no rubs, no murmurs ABD:  Soft, nontender. BS +, no masses or bruits. No hepatomegaly, no splenomegaly EXT:  2 + pulses throughout, no edema, no cyanosis no clubbing SKIN:  Warm and dry.  No rashes NEURO:  Alert and oriented x 3. Cranial nerves II through XII intact. PSYCH:  Cognitively intact    LABORATORY DATA:  Lab Results  Component Value Date   WBC 4.9 02/19/2023   HGB 12.7 02/19/2023   HCT 40.7 02/19/2023   PLT 221 02/19/2023   GLUCOSE 88 02/19/2023   CHOL 154 02/19/2023   TRIG 82 02/19/2023   HDL 57 02/19/2023   LDLDIRECT 126.4 10/07/2012   LDLCALC 81 02/19/2023   ALT 18 02/19/2023   AST 24 02/19/2023   NA 144 02/19/2023   K 4.7 02/19/2023   CL 106 02/19/2023   CREATININE 0.85 02/19/2023   BUN 17 02/19/2023   CO2 27 02/19/2023   INR 3.0 09/03/2023   HGBA1C 5.8 (H) 02/19/2023   Labs from primary care dated 05/02/15: normal CMET Dated 05/25/15: A1c5.8% Dated 11/10/15: cholesterol 177, triglycerides 91, LDL 110, HDL 49. Dated 05/17/19: glucose 114. A1c 5.6%. BMET otherwise normal. Dated 03/14/20: normal BMET  EKG Interpretation Date/Time:  Thursday October 09 2023 09:02:01 EDT Ventricular Rate:  68 PR Interval:  200 QRS Duration:  148 QT Interval:  476 QTC Calculation: 506 R Axis:   -67  Text Interpretation: Sinus rhythm with Premature atrial complexes with Abberant conduction Right bundle branch block Left anterior fascicular block Bifascicular block Minimal voltage criteria for LVH, may be normal variant ( R in aVL ) Septal infarct , age undetermined When compared with ECG of 15-Nov-2022 09:36, PVCs are now Present Confirmed by [place], Corwin Kuiken [phone]) on 10/09/2023 9:12:07 AM    Myoview  03/20/16: Study Highlights     The left ventricular ejection fraction is normal (55-65%). Nuclear stress EF: 64%. There was no ST segment  deviation noted during stress. Blood pressure demonstrated a normal response to exercise.   Normal study, no evidence for ischemia or infarction   Echo 03/29/21: IMPRESSIONS     1. Left ventricular ejection fraction, by estimation, is 60 to 65%. The  left ventricle has normal function. The left ventricle has no regional  wall motion abnormalities. Left ventricular diastolic parameters were  normal.   2. Right ventricular systolic function is mildly reduced. The right  ventricular size is normal.   3. The mitral valve is normal in structure. Mild mitral valve  regurgitation.   4. The aortic valve has been repaired/replaced. Aortic  valve  regurgitation is trivial.   5. Pulmonic valve regurgitation is moderate.   Assessment / Plan: 1. Status post mechanical aortic valve replacement.  Exam is stable. INR has been therapeutic.   Routine SBE prophylaxis. Echo in Jan 2023 showed normal valve function.  2. Status post CABG for ostial coronary disease. No angina. Myoview  03/20/16 was normal. Continue current medical therapy. She is asymptomatic.   3. Hyperlipidemia.   On very low dose Crestor . Intolerant of multiple meds. Will continue current therapy. Last LDL 81   4. Right bundle branch block/LAFB- chronic. She is asymptomatic.  5. HTN  excellent control    6. Prediabetes. Per PCP    I will follow up in 6 months

## 2023-10-09 ENCOUNTER — Ambulatory Visit: Attending: Rheumatology

## 2023-10-09 ENCOUNTER — Encounter: Payer: Self-pay | Admitting: Cardiology

## 2023-10-09 ENCOUNTER — Ambulatory Visit: Attending: Cardiology | Admitting: Cardiology

## 2023-10-09 VITALS — BP 112/56 | HR 68 | Ht 65.0 in | Wt 153.0 lb

## 2023-10-09 DIAGNOSIS — M5459 Other low back pain: Secondary | ICD-10-CM | POA: Insufficient documentation

## 2023-10-09 DIAGNOSIS — Z952 Presence of prosthetic heart valve: Secondary | ICD-10-CM

## 2023-10-09 DIAGNOSIS — E785 Hyperlipidemia, unspecified: Secondary | ICD-10-CM | POA: Diagnosis not present

## 2023-10-09 DIAGNOSIS — R252 Cramp and spasm: Secondary | ICD-10-CM | POA: Diagnosis present

## 2023-10-09 DIAGNOSIS — R293 Abnormal posture: Secondary | ICD-10-CM | POA: Diagnosis present

## 2023-10-09 DIAGNOSIS — R262 Difficulty in walking, not elsewhere classified: Secondary | ICD-10-CM | POA: Diagnosis present

## 2023-10-09 DIAGNOSIS — I10 Essential (primary) hypertension: Secondary | ICD-10-CM

## 2023-10-09 DIAGNOSIS — M6281 Muscle weakness (generalized): Secondary | ICD-10-CM | POA: Insufficient documentation

## 2023-10-09 DIAGNOSIS — I4510 Unspecified right bundle-branch block: Secondary | ICD-10-CM

## 2023-10-09 DIAGNOSIS — I359 Nonrheumatic aortic valve disorder, unspecified: Secondary | ICD-10-CM | POA: Diagnosis not present

## 2023-10-09 NOTE — Therapy (Signed)
 OUTPATIENT PHYSICAL THERAPY THORACOLUMBAR EVALUATION   Patient Name: Tamara Patterson MRN: [id number] DOB:November 24, 1940, 83 y.o., female Today's Date: 10/09/2023  END OF SESSION:  PT End of Session - 10/09/23 1017     Visit Number 2    Number of Visits 6    Date for PT Re-Evaluation 11/03/23    Authorization Type [employer] MCR    PT Start Time 1018    PT Stop Time 1100    PT Time Calculation (min) 42 min    Activity Tolerance Patient tolerated treatment well    Behavior During Therapy WFL for tasks assessed/performed          Past Medical History:  Diagnosis Date   Anticoagulant long-term use    CAD (coronary artery disease)    Depression    seasonal   Dyslipidemia    GERD (gastroesophageal reflux disease)    Giant cell arteritis (HCC)    per aortotomy biopsy in 1991   HTN (hypertension)    Hx of CABG 1991   LIMA-LAD, SVG-RCA, SVG-1st and 2ndOM   Panic anxiety syndrome    S/P AVR (aortic valve replacement)    with root grafting; #76mm  St. Jude valve conduit; Last Echo 2007   Past Surgical History:  Procedure Laterality Date   AORTIC VALVE REPLACEMENT     with root grafting   CARDIAC SURGERY     CHOLECYSTECTOMY     CORONARY ARTERY BYPASS GRAFT     Patient Active Problem List   Diagnosis Date Noted   Seasonal depression (HCC) 11/29/2017   Aortic valve disorder 06/26/2010   Long term current use of anticoagulant 06/26/2010   Palpitations 04/24/2010   S/P St Jude AVR '91    Hx of CABG-'91    Dyslipidemia    HTN (hypertension)    Panic anxiety syndrome    Giant cell arteritis (HCC)     PCP: Sun, Vyvyan, MD   REFERRING PROVIDER: Ishmael Slough, MD   REFERRING DIAG: M54.50  Low Back pain at multiple sites  Rationale for Evaluation and Treatment: Rehabilitation  THERAPY DIAG:  Other low back pain  Cramp and spasm  Difficulty in walking, not elsewhere classified  Muscle weakness (generalized)  Abnormal posture  ONSET DATE: one month ago  SUBJECTIVE:                                                                                                                                                                                            SUBJECTIVE STATEMENT: Not feeling significant pain this morning but it's still about the same as far as the constant dull ache as I move  about.  I admit that I did the HEP the first day after my eval but I misplaced my   PERTINENT HISTORY:   CAD, HTN  PAIN:  Are you having pain? Yes: NPRS scale: 0/10  Pain location: low back (sometimes catches on the R) Pain description: achy soreness Aggravating factors: sitting a long time, first thing in the morning Relieving factors: moving around  PRECAUTIONS: None  RED FLAGS: None   WEIGHT BEARING RESTRICTIONS: No  FALLS:  Has patient fallen in last 6 months? Yes. Number of falls 1 slipped on a rug in the house and lightly sprained her L ankle.(Two weeks ago)  LIVING ENVIRONMENT: Lives with: lives with their spouse Lives in: House/apartment Stairs: No Has following equipment at home: None  OCCUPATION: retired  PLOF: Independent  PATIENT GOALS: learn how to stretch and be more flexible  NEXT MD VISIT: none scheduled  OBJECTIVE:  Note: Objective measures were completed at Evaluation unless otherwise noted.  DIAGNOSTIC FINDINGS:  none  PATIENT SURVEYS:  The Patient-Specific Functional Scale  Initial:  I am going to ask you to identify up to 3 important activities that you are unable to do or are having difficulty with as a result of this problem.  Today are there any activities that you are unable to do or having difficulty with because of this?  (Patient shown scale and patient rated each activity)  Follow up: When you first came in you had difficulty performing these activities.  Today do you still have difficulty?  Patient-Specific activity scoring scheme (Point to one number):  0 1 2 3 4 5 6 7 8 9  10 Unable                                                                                                           Able to perform To perform                                                                                                    activity at the same Activity         Level as before  Injury or problem  Activity       Getting up from bed in the morning                                                                          Initial:           4-5            follow up:  2.        Getting in/out of car                                                                         Initial:          4-5             follow up:  3.          Sit to stand                                                                        Initial:         4-5              follow up:      COGNITION: Overall cognitive status: Within functional limits for tasks assessed      MUSCLE LENGTH:  HS R>L  piriformis L>R  POSTURE: No Significant postural limitations   PALPATION: Left gluteals and piriformis and lumbar TTP  LUMBAR ROM:  WFL pain with all motions except rotation  LOWER EXTREMITY ROM:   WFL for tasks assessed  LOWER EXTREMITY MMT:  5/5 except hip flex 4+/5  FUNCTIONAL TESTS:  5 times sit to stand: 12.48 sec    TREATMENT DATE:                                                                                                                               10/09/23: Nustep x 7 min level 2 Standing HS stretch 3 x 30 sec each LE Standing quad/hip flexor stretch 3 x 30 sec each LE Seated piriformis stretch 3 x 30 sec each LE Seated mini sit ups with 5 lbs 2 x 10  Seated  modified [place] twist with 5 lbs x 20 Seated Hip to shoulder with 5 lbs 2 x 10 Reviewed and completed all reps of HEP Provided copies of all HEP  09/08/23  See pt ed and HEP    PATIENT EDUCATION:  Education details: PT eval findings, anticipated POC,  and initial HEP  Person educated: Patient Education method: Explanation, Demonstration, Tactile cues, Verbal cues, and Handouts Education comprehension: verbalized understanding and returned demonstration  HOME EXERCISE PROGRAM: Access Code: [email or web address] URL: [email or web address] Date: 10/09/2023 Prepared by: Delon Haddock  Exercises - Supine Bridge  - 1 x daily - 7 x weekly - 1-3 sets - 10 reps - Supine Lower Trunk Rotation  - 1 x daily - 7 x weekly - 1 sets - 5-10 reps - Supine Posterior Pelvic Tilt  - 1 x daily - 7 x weekly - 1-2 sets - 10 reps - Seated Figure 4 Piriformis Stretch  - 2 x daily - 7 x weekly - 1 sets - 3 reps - 30 sec hold - Sit to Stand with Arms Crossed  - 3 x daily - 7 x weekly - 1 sets - 5-10 reps - Seated Pelvic Tilts  - 3 x daily - 7 x weekly - 1-2 sets - 10 reps - Standing Hamstring Stretch on Chair  - 1 x daily - 7 x weekly - 1 sets - 3 reps - 30 sec hold - Quadricep Stretch with Chair and Counter Support  - 1 x daily - 7 x weekly - 1 sets - 3 reps - 30 sec hold ASSESSMENT:  CLINICAL IMPRESSION: Lashia is responding well to current POC.  She misplaced her HEP so she only got one session in since her initial eval.  We added core strengthening seated and 2 new stretches.  She denied any pain throughout session and was very diligent with her form.  She has resumed a routine exercise program and feels she will do well with this and learning more about what to do for her back from PT.  She is well motivated and compliant.  She was committed to cardiac rehab for 13 years and got away from it in 2013 and never resumed a regular exercise routine.  She will likely do very well.   She will benefit from skilled PT to address these deficits.    OBJECTIVE IMPAIRMENTS: decreased mobility, decreased strength, increased muscle spasms, impaired flexibility, and pain.   ACTIVITY LIMITATIONS: bending, standing, transfers, bed mobility, and locomotion  level  PARTICIPATION LIMITATIONS: painful but not limited  PERSONAL FACTORS: Age and 1 comorbidity: HTN are also affecting patient's functional outcome.   REHAB POTENTIAL: Excellent  CLINICAL DECISION MAKING: Stable/uncomplicated  EVALUATION COMPLEXITY: Low   GOALS: Goals reviewed with patient? Yes  SHORT TERM GOALS: Target date: 09/29/2023   Patient will be independent with initial HEP.  Baseline:  Goal status: INITIAL  2.  Decreased pain in low back with transitional movements by 30%.  Baseline:  Goal status: INITIAL   LONG TERM GOALS: Target date: 11/03/2023   Patient will be independent with advanced/ongoing HEP to improve outcomes and carryover.  Baseline:  Goal status: INITIAL  2.  Patient will report 75% improvement in low back pain with sit to stands, getting up from bed in the morning and car transfers. Baseline:  Goal status: INITIAL  3.  Patient will demonstrate functional pain free lumbar ROM to perform ADLs.   Baseline:  Goal status: INITIAL  4.  Patient will improve 2-3 points or  better on the PSFS showing functional improvement   Baseline: 4-5/10 Goal status: INITIAL  6.  Patient able to move in bed at previous functional level. Baseline:  Goal status: INITIAL   PLAN:  PT FREQUENCY: 1x/week  PT DURATION: 6 sessions  PLANNED INTERVENTIONS: 97164- PT Re-evaluation, 97110-Therapeutic exercises, 97530- Therapeutic activity, [id number]- Neuromuscular re-education, 97535- Self Care, [id number]- Manual therapy, [id number]- Electrical stimulation (unattended), [phone]- Traction (mechanical), 20560 (1-2 muscles), 20561 (3+ muscles)- Dry Needling, Patient/Family education, Joint mobilization, Spinal mobilization, Cryotherapy, and Moist heat.  PLAN FOR NEXT SESSION: Continue to focus on hip flexibility (piriformis/HS), core work, lumbar mobility in supine and sitting, transitional movement/bed mobility   Angas Isabell B. Nataliya Graig, PT 10/09/23 11:05 AM [facility] [address], [address] [place], [place] [address] Phone # [phone] Fax [phone]

## 2023-10-09 NOTE — Patient Instructions (Signed)

## 2023-10-15 ENCOUNTER — Ambulatory Visit: Attending: Cardiology | Admitting: *Deleted

## 2023-10-15 DIAGNOSIS — I359 Nonrheumatic aortic valve disorder, unspecified: Secondary | ICD-10-CM | POA: Diagnosis not present

## 2023-10-15 DIAGNOSIS — Z7901 Long term (current) use of anticoagulants: Secondary | ICD-10-CM | POA: Diagnosis not present

## 2023-10-15 LAB — POCT INR: INR: 3.3 — AB (ref 2.0–3.0)

## 2023-10-15 NOTE — Progress Notes (Signed)
 INR 3.3  Please see anticoagulation encounter

## 2023-10-15 NOTE — Patient Instructions (Signed)
 Description   Do not take any warfarin today then continue taking 5mg  daily EXCEPT 7.5mg  on Mondays, Wednesdays and Fridays.  Stay consistent with greens each week.  Repeat INR in 4 weeks. [facility] # [phone]

## 2023-10-16 ENCOUNTER — Encounter: Admitting: Rehabilitative and Restorative Service Providers"

## 2023-10-30 ENCOUNTER — Ambulatory Visit: Attending: Rheumatology

## 2023-10-30 DIAGNOSIS — R293 Abnormal posture: Secondary | ICD-10-CM | POA: Diagnosis present

## 2023-10-30 DIAGNOSIS — R252 Cramp and spasm: Secondary | ICD-10-CM | POA: Insufficient documentation

## 2023-10-30 DIAGNOSIS — R262 Difficulty in walking, not elsewhere classified: Secondary | ICD-10-CM | POA: Insufficient documentation

## 2023-10-30 DIAGNOSIS — M6281 Muscle weakness (generalized): Secondary | ICD-10-CM | POA: Insufficient documentation

## 2023-10-30 DIAGNOSIS — M5459 Other low back pain: Secondary | ICD-10-CM | POA: Insufficient documentation

## 2023-10-30 NOTE — Therapy (Addendum)
 OUTPATIENT PHYSICAL THERAPY THORACOLUMBAR EVALUATION   Patient Name: Tamara Patterson MRN: [id number] DOB:1941-02-24, 83 y.o., female Today's Date: 10/31/2023  END OF SESSION:  PT End of Session - 10/30/23 1022     Visit Number 3    Number of Visits 6    Date for PT Re-Evaluation 11/03/23    Authorization Type [employer] MCR    Authorization Time Period New auth requested 10/30/23 (submitted 10/31/23)    Authorization - Visit Number 3    Authorization - Number of Visits 6    Progress Note Due on Visit 10    PT Start Time 1022    PT Stop Time 1100    PT Time Calculation (min) 38 min    Activity Tolerance Patient tolerated treatment well    Behavior During Therapy WFL for tasks assessed/performed          Past Medical History:  Diagnosis Date   Anticoagulant long-term use    CAD (coronary artery disease)    Depression    seasonal   Dyslipidemia    GERD (gastroesophageal reflux disease)    Giant cell arteritis (HCC)    per aortotomy biopsy in 1991   HTN (hypertension)    Hx of CABG 1991   LIMA-LAD, SVG-RCA, SVG-1st and 2ndOM   Panic anxiety syndrome    S/P AVR (aortic valve replacement)    with root grafting; #4mm  St. Jude valve conduit; Last Echo 2007   Past Surgical History:  Procedure Laterality Date   AORTIC VALVE REPLACEMENT     with root grafting   CARDIAC SURGERY     CHOLECYSTECTOMY     CORONARY ARTERY BYPASS GRAFT     Patient Active Problem List   Diagnosis Date Noted   Seasonal depression (HCC) 11/29/2017   Aortic valve disorder 06/26/2010   Long term current use of anticoagulant 06/26/2010   Palpitations 04/24/2010   S/P St Jude AVR '91    Hx of CABG-'91    Dyslipidemia    HTN (hypertension)    Panic anxiety syndrome    Giant cell arteritis (HCC)     PCP: Sun, Vyvyan, MD   REFERRING PROVIDER: Ishmael Slough, MD   REFERRING DIAG: M54.50  Low Back pain at multiple sites  Rationale for Evaluation and Treatment: Rehabilitation  THERAPY DIAG:   Other low back pain - Plan: PT plan of care cert/re-cert  Difficulty in walking, not elsewhere classified - Plan: PT plan of care cert/re-cert  Muscle weakness (generalized) - Plan: PT plan of care cert/re-cert  Cramp and spasm - Plan: PT plan of care cert/re-cert  Abnormal posture - Plan: PT plan of care cert/re-cert  ONSET DATE: 08/08/23  SUBJECTIVE:  SUBJECTIVE STATEMENT: Patient reports she is doing ok today but had a set back .  She explains she went on a trip to [place], [place] .  They drove the whole way and then the bed at the place she was staying was very uncomfortable.  She states that she was at about 7/10 pain yesterday but now at 4/10 today.    PERTINENT HISTORY:   CAD, HTN  PAIN:  10/30/23 Are you having pain? Yes: NPRS scale: 4/10  Pain location: low back (sometimes catches on the R) Pain description: achy soreness Aggravating factors: sitting a long time, first thing in the morning Relieving factors: moving around  PRECAUTIONS: None  RED FLAGS: None   WEIGHT BEARING RESTRICTIONS: No  FALLS:  Has patient fallen in last 6 months? Yes. Number of falls 1 slipped on a rug in the house and lightly sprained her L ankle.(Two weeks ago)  LIVING ENVIRONMENT: Lives with: lives with their spouse Lives in: House/apartment Stairs: No Has following equipment at home: None  OCCUPATION: retired  PLOF: Independent  PATIENT GOALS: learn how to stretch and be more flexible  NEXT MD VISIT: none scheduled  OBJECTIVE:  Note: Objective measures were completed at Evaluation unless otherwise noted.  DIAGNOSTIC FINDINGS:  none  PATIENT SURVEYS:  The Patient-Specific Functional Scale  Initial:  I am going to ask you to identify up to 3 important activities that you are unable to do or  are having difficulty with as a result of this problem.  Today are there any activities that you are unable to do or having difficulty with because of this?  (Patient shown scale and patient rated each activity)  Follow up: When you first came in you had difficulty performing these activities.  Today do you still have difficulty?  Patient-Specific activity scoring scheme (Point to one number):  0 1 2 3 4 5 6 7 8 9  10 Unable                                                                                                          Able to perform To perform                                                                                                    activity at the same Activity         Level as before  Injury or problem  Activity       Getting up from bed in the morning                                  Initial:           4-5            follow up 10/30/23: 5  2.        Getting in/out of car                                                           Initial:          4-5             follow up 10/30/23:  5  3.          Sit to stand                                                                        Initial:         4-5              follow up 10/30/23 : 5      COGNITION: Overall cognitive status: Within functional limits for tasks assessed      MUSCLE LENGTH:  HS R>L  piriformis L>R  POSTURE: No Significant postural limitations   PALPATION: Left gluteals and piriformis and lumbar TTP  LUMBAR ROM:  WFL pain with all motions except rotation  LOWER EXTREMITY ROM:   WFL for tasks assessed  LOWER EXTREMITY MMT:  5/5 except hip flex 4+/5  FUNCTIONAL TESTS:  5 times sit to stand: 12.48 sec   10/30/23: complete next visit (patient is moving much better,  she would likely score around 10 seconds based on her increased ease of movement)   TREATMENT DATE:                                                                                                                                10/30/23: Nustep x 7 min level 5 (PT present to discuss posture and tolerance to increased resistance, progress toward goals, how to manage pain after long car rides) Standing HS stretch 3 x 30 sec each LE in // bars with chairs Standing quad/hip flexor stretch 3 x 30 sec each LE in // bars with chair Seated piriformis stretch 3 x 30 sec each LE Seated  isometric ball press down with hands on knees using yellow physio ball in cx gym 10 x holding 5 seconds Seated mini sit ups with 5 lbs 2 x 10  Seated modified Russian twist with 5 lbs x 20 Seated Hip to shoulder with 5 lbs 2 x 10 Reviewed how to break up her HEP into stretching and strengthening. Instructed best results would come with doing these daily in the acute stages of her pain but she could then drop to every other day once she has good control over her pain, but realizing that a consistent stretching program is crucial as we age.     10/09/23: Nustep x 7 min level 2 Standing HS stretch 3 x 30 sec each LE Standing quad/hip flexor stretch 3 x 30 sec each LE Seated piriformis stretch 3 x 30 sec each LE Seated mini sit ups with 5 lbs 2 x 10  Seated modified Russian twist with 5 lbs x 20 Seated Hip to shoulder with 5 lbs 2 x 10 Reviewed and completed all reps of HEP Provided copies of all HEP  09/08/23  See pt ed and HEP    PATIENT EDUCATION:  Education details: Reviewed how to break up her HEP into stretching and strengthening. Instructed best results would come with doing these daily in the acute stages of her pain but she could then drop to every other day once she has good control over her pain, but realizing that a consistent stretching program is crucial as we age.   Person educated: Patient Education method: Explanation, Demonstration, Tactile cues, Verbal cues, and Handouts Education comprehension: verbalized  understanding and returned demonstration  HOME EXERCISE PROGRAM: Access Code: [email or web address] URL: [email or web address] Date: 10/09/2023 Prepared by: Delon Haddock  Exercises - Supine Bridge  - 1 x daily - 7 x weekly - 1-3 sets - 10 reps - Supine Lower Trunk Rotation  - 1 x daily - 7 x weekly - 1 sets - 5-10 reps - Supine Posterior Pelvic Tilt  - 1 x daily - 7 x weekly - 1-2 sets - 10 reps - Seated Figure 4 Piriformis Stretch  - 2 x daily - 7 x weekly - 1 sets - 3 reps - 30 sec hold - Sit to Stand with Arms Crossed  - 3 x daily - 7 x weekly - 1 sets - 5-10 reps - Seated Pelvic Tilts  - 3 x daily - 7 x weekly - 1-2 sets - 10 reps - Standing Hamstring Stretch on Chair  - 1 x daily - 7 x weekly - 1 sets - 3 reps - 30 sec hold - Quadricep Stretch with Chair and Counter Support  - 1 x daily - 7 x weekly - 1 sets - 3 reps - 30 sec hold ASSESSMENT:  CLINICAL IMPRESSION: Shalom is progressing appropriately.  She had a slight set back after going on a long road trip and sitting in the car for long periods of time.  She did get out and walk intermittently but was hurting once she returned home.  She seems to be improving since she returned home.  She is well motivated and compliant.  We focused on reviewing technique on her stretches since she had been away for a while and we added one addl core exercise.  She will likely continue to do very well.   She will benefit from skilled PT to address these deficits.    OBJECTIVE IMPAIRMENTS: decreased mobility, decreased strength, increased muscle spasms, impaired flexibility, and pain.  ACTIVITY LIMITATIONS: bending, standing, transfers, bed mobility, and locomotion level  PARTICIPATION LIMITATIONS: painful but not limited  PERSONAL FACTORS: Age and 1 comorbidity: HTN are also affecting patient's functional outcome.   REHAB POTENTIAL: Excellent  CLINICAL DECISION MAKING: Stable/uncomplicated  EVALUATION COMPLEXITY: Low   GOALS: Goals  reviewed with patient? Yes  SHORT TERM GOALS: Target date: 09/29/2023   Patient will be independent with initial HEP.  Baseline:  Goal status: MET 10/30/23  2.  Decreased pain in low back with transitional movements by 30%.  Baseline:  Goal status: MET 10/30/23   LONG TERM GOALS: Target date: 12/19/23   Patient will be independent with advanced/ongoing HEP to improve outcomes and carryover.  Baseline:  Goal status: Progressing  2.  Patient will report 75% improvement in low back pain with sit to stands, getting up from bed in the morning and car transfers. Baseline:  Goal status: In progress  3.  Patient will demonstrate functional pain free lumbar ROM to perform ADLs.   Baseline:  Goal status: In Progress  4.  Patient will improve 2-3 points or better on the PSFS showing functional improvement   Baseline: 4-5/10 Goal status: In Progress  6.  Patient able to move in bed at previous functional level. Baseline:  Goal status: In Progress   PLAN:  PT FREQUENCY: 1x/week  PT DURATION: 6-8 sessions   PLANNED INTERVENTIONS: 97164- PT Re-evaluation, 97110-Therapeutic exercises, 97530- Therapeutic activity, [id number]- Neuromuscular re-education, 97535- Self Care, 02859- Manual therapy, G0283- Electrical stimulation (unattended), [phone]- Traction (mechanical), 20560 (1-2 muscles), 20561 (3+ muscles)- Dry Needling, Patient/Family education, Joint mobilization, Spinal mobilization, Cryotherapy, and Moist heat.  PLAN FOR NEXT SESSION: Continue to focus on hip flexibility (piriformis/HS), progress core work and add into HEP, lumbar mobility in supine and sitting, transitional movement/bed mobility   Jerilyn Gillaspie B. Jodeen Mclin, PT 10/31/23 8:42 AM [facility] [address], [address] [place], [place] [address] Phone # [phone] Fax [phone]

## 2023-10-31 NOTE — Addendum Note (Signed)
 Addended by: HARVEY NEST B on: 10/31/2023 08:42 AM   Modules accepted: Orders

## 2023-11-06 ENCOUNTER — Ambulatory Visit

## 2023-11-06 DIAGNOSIS — M6281 Muscle weakness (generalized): Secondary | ICD-10-CM

## 2023-11-06 DIAGNOSIS — M5459 Other low back pain: Secondary | ICD-10-CM

## 2023-11-06 DIAGNOSIS — R262 Difficulty in walking, not elsewhere classified: Secondary | ICD-10-CM

## 2023-11-06 DIAGNOSIS — R252 Cramp and spasm: Secondary | ICD-10-CM

## 2023-11-06 DIAGNOSIS — R293 Abnormal posture: Secondary | ICD-10-CM

## 2023-11-06 NOTE — Therapy (Signed)
 OUTPATIENT PHYSICAL THERAPY THORACOLUMBAR TREATMENT NOTE   Patient Name: Tamara Patterson MRN: [id number] DOB:Jun 12, 1940, 83 y.o., female Today's Date: 11/06/2023  END OF SESSION:  PT End of Session - 11/06/23 1018     Visit Number 4    Number of Visits 10    Date for PT Re-Evaluation 12/25/23    Authorization Type UHC MCR    Authorization Time Period [employer] approved 6 more visits from 10/30/23 through 12/25/23    Authorization - Visit Number 4    Authorization - Number of Visits 10    Progress Note Due on Visit 10    PT Start Time 1015    PT Stop Time 1100    PT Time Calculation (min) 45 min    Activity Tolerance Patient tolerated treatment well    Behavior During Therapy WFL for tasks assessed/performed          Past Medical History:  Diagnosis Date   Anticoagulant long-term use    CAD (coronary artery disease)    Depression    seasonal   Dyslipidemia    GERD (gastroesophageal reflux disease)    Giant cell arteritis (HCC)    per aortotomy biopsy in 1991   HTN (hypertension)    Hx of CABG 1991   LIMA-LAD, SVG-RCA, SVG-1st and 2ndOM   Panic anxiety syndrome    S/P AVR (aortic valve replacement)    with root grafting; #28mm  St. Jude valve conduit; Last Echo 2007   Past Surgical History:  Procedure Laterality Date   AORTIC VALVE REPLACEMENT     with root grafting   CARDIAC SURGERY     CHOLECYSTECTOMY     CORONARY ARTERY BYPASS GRAFT     Patient Active Problem List   Diagnosis Date Noted   Seasonal depression (HCC) 11/29/2017   Aortic valve disorder 06/26/2010   Long term current use of anticoagulant 06/26/2010   Palpitations 04/24/2010   S/P St Jude AVR '91    Hx of CABG-'91    Dyslipidemia    HTN (hypertension)    Panic anxiety syndrome    Giant cell arteritis (HCC)     PCP: Sun, Vyvyan, MD   REFERRING PROVIDER: Ishmael Slough, MD   REFERRING DIAG: M54.50  Low Back pain at multiple sites  Rationale for Evaluation and Treatment:  Rehabilitation  THERAPY DIAG:  Other low back pain  Difficulty in walking, not elsewhere classified  Muscle weakness (generalized)  Cramp and spasm  Abnormal posture  ONSET DATE: 08/08/23  SUBJECTIVE:  SUBJECTIVE STATEMENT: Patient reports she is doing great  PERTINENT HISTORY:   CAD, HTN  PAIN:  11/06/23 Are you having pain? Yes: NPRS scale: 2/10  Pain location: low back (sometimes catches on the R) Pain description: achy soreness Aggravating factors: sitting a long time, first thing in the morning Relieving factors: moving around  PRECAUTIONS: None  RED FLAGS: None   WEIGHT BEARING RESTRICTIONS: No  FALLS:  Has patient fallen in last 6 months? Yes. Number of falls 1 slipped on a rug in the house and lightly sprained her L ankle.(Two weeks ago)  LIVING ENVIRONMENT: Lives with: lives with their spouse Lives in: House/apartment Stairs: No Has following equipment at home: None  OCCUPATION: retired  PLOF: Independent  PATIENT GOALS: learn how to stretch and be more flexible  NEXT MD VISIT: none scheduled  OBJECTIVE:  Note: Objective measures were completed at Evaluation unless otherwise noted.  DIAGNOSTIC FINDINGS:  none  PATIENT SURVEYS:  The Patient-Specific Functional Scale  Initial:  I am going to ask you to identify up to 3 important activities that you are unable to do or are having difficulty with as a result of this problem.  Today are there any activities that you are unable to do or having difficulty with because of this?  (Patient shown scale and patient rated each activity)  Follow up: When you first came in you had difficulty performing these activities.  Today do you still have difficulty?  Patient-Specific activity scoring scheme (Point to one  number):  0 1 2 3 4 5 6 7 8 9  10 Unable                                                                                                          Able to perform To perform                                                                                                    activity at the same Activity         Level as before  Injury or problem  Activity       Getting up from bed in the morning                                  Initial:           4-5            follow up 10/30/23: 5  2.        Getting in/out of car                                                           Initial:          4-5             follow up 10/30/23:  5  3.          Sit to stand                                                                        Initial:         4-5              follow up 10/30/23 : 5      COGNITION: Overall cognitive status: Within functional limits for tasks assessed      MUSCLE LENGTH:  HS R>L  piriformis L>R  POSTURE: No Significant postural limitations   PALPATION: Left gluteals and piriformis and lumbar TTP  LUMBAR ROM:  WFL pain with all motions except rotation  LOWER EXTREMITY ROM:   WFL for tasks assessed  LOWER EXTREMITY MMT:  5/5 except hip flex 4+/5  FUNCTIONAL TESTS:  5 times sit to stand: 12.48 sec   10/30/23: complete next visit (patient is moving much better,  she would likely score around 10 seconds based on her increased ease of movement)   TREATMENT DATE:                                                                                                                               11/06/23: Nustep x 7 min level 5 (PT present to discuss posture and tolerance to increased resistance, progress toward goals, proper knee and hip alignment on Nustep) Standing HS stretch 2 x 30 sec each LE in // bars with chairs Standing quad/hip flexor stretch 2 x 30 sec  each LE in // bars with chair Seated clam x 20 with red loop Hook lying clam with  red loop x 20 Supine PPT x 20 Supine PPT with 90/90 heel tap x 20 Supine PPT with dying bug x 20   10/30/23: Nustep x 7 min level 5 (PT present to discuss posture and tolerance to increased resistance, progress toward goals, how to manage pain after long car rides) Standing HS stretch 3 x 30 sec each LE in // bars with chairs Standing quad/hip flexor stretch 3 x 30 sec each LE in // bars with chair Seated piriformis stretch 3 x 30 sec each LE Seated isometric ball press down with hands on knees using yellow physio ball in cx gym 10 x holding 5 seconds Seated mini sit ups with 5 lbs 2 x 10  Seated modified Russian twist with 5 lbs x 20 Seated Hip to shoulder with 5 lbs 2 x 10 Reviewed how to break up her HEP into stretching and strengthening. Instructed best results would come with doing these daily in the acute stages of her pain but she could then drop to every other day once she has good control over her pain, but realizing that a consistent stretching program is crucial as we age.     10/09/23: Nustep x 7 min level 2 Standing HS stretch 3 x 30 sec each LE Standing quad/hip flexor stretch 3 x 30 sec each LE Seated piriformis stretch 3 x 30 sec each LE Seated mini sit ups with 5 lbs 2 x 10  Seated modified Russian twist with 5 lbs x 20 Seated Hip to shoulder with 5 lbs 2 x 10 Reviewed and completed all reps of HEP Provided copies of all HEP  09/08/23  See pt ed and HEP    PATIENT EDUCATION:  Education details: Reviewed how to break up her HEP into stretching and strengthening. Instructed best results would come with doing these daily in the acute stages of her pain but she could then drop to every other day once she has good control over her pain, but realizing that a consistent stretching program is crucial as we age.   Person educated: Patient Education method: Explanation, Demonstration, Tactile  cues, Verbal cues, and Handouts Education comprehension: verbalized understanding and returned demonstration  HOME EXERCISE PROGRAM: Access Code: [email or web address] URL: [email or web address] Date: 11/06/2023 Prepared by: Delon Haddock  Exercises - Standing Hamstring Stretch on Chair  - 1 x daily - 7 x weekly - 1 sets - 3 reps - 30 sec hold - Quadricep Stretch with Chair and Counter Support  - 1 x daily - 7 x weekly - 1 sets - 3 reps - 30 sec hold - Seated Figure 4 Piriformis Stretch  - 2 x daily - 7 x weekly - 1 sets - 3 reps - 30 sec hold - Seated Pelvic Tilts  - 3 x daily - 7 x weekly - 1-2 sets - 10 reps - Supine Bridge  - 1 x daily - 7 x weekly - 1-3 sets - 10 reps - Supine Lower Trunk Rotation  - 1 x daily - 7 x weekly - 1 sets - 5-10 reps - Supine Posterior Pelvic Tilt  - 1 x daily - 7 x weekly - 1-2 sets - 10 reps - Supine 90/90 Alternating Heel Touches with Posterior Pelvic Tilt  - 1 x daily - 7 x weekly - 3 sets - 10 reps - Supine Dead Bug with Leg Extension  - 1 x daily - 7 x weekly - 3 sets - 10 reps - Sit to Stand with Arms Crossed  -  3 x daily - 7 x weekly - 1 sets - 5-10 reps  ASSESSMENT:  CLINICAL IMPRESSION: Tawyna is progressing very well.  She was able to do some higher level core exercises today without pain and with good form.   She will likely continue to do very well.   She will benefit from continued skilled PT to address these deficits.    OBJECTIVE IMPAIRMENTS: decreased mobility, decreased strength, increased muscle spasms, impaired flexibility, and pain.   ACTIVITY LIMITATIONS: bending, standing, transfers, bed mobility, and locomotion level  PARTICIPATION LIMITATIONS: painful but not limited  PERSONAL FACTORS: Age and 1 comorbidity: HTN are also affecting patient's functional outcome.   REHAB POTENTIAL: Excellent  CLINICAL DECISION MAKING: Stable/uncomplicated  EVALUATION COMPLEXITY: Low   GOALS: Goals reviewed with patient? Yes  SHORT TERM  GOALS: Target date: 09/29/2023   Patient will be independent with initial HEP.  Baseline:  Goal status: MET 10/30/23  2.  Decreased pain in low back with transitional movements by 30%.  Baseline:  Goal status: MET 10/30/23   LONG TERM GOALS: Target date: 12/19/23   Patient will be independent with advanced/ongoing HEP to improve outcomes and carryover.  Baseline:  Goal status: Progressing  2.  Patient will report 75% improvement in low back pain with sit to stands, getting up from bed in the morning and car transfers. Baseline:  Goal status: In progress  3.  Patient will demonstrate functional pain free lumbar ROM to perform ADLs.   Baseline:  Goal status: In Progress  4.  Patient will improve 2-3 points or better on the PSFS showing functional improvement   Baseline: 4-5/10 Goal status: In Progress  6.  Patient able to move in bed at previous functional level. Baseline:  Goal status: In Progress   PLAN:  PT FREQUENCY: 1x/week  PT DURATION: 6-8 sessions   PLANNED INTERVENTIONS: 97164- PT Re-evaluation, 97110-Therapeutic exercises, 97530- Therapeutic activity, [id number]- Neuromuscular re-education, 97535- Self Care, 02859- Manual therapy, G0283- Electrical stimulation (unattended), [phone]- Traction (mechanical), 20560 (1-2 muscles), 20561 (3+ muscles)- Dry Needling, Patient/Family education, Joint mobilization, Spinal mobilization, Cryotherapy, and Moist heat.  PLAN FOR NEXT SESSION: Continue to focus on hip flexibility (piriformis/HS), progress core work and add into HEP, lumbar mobility in supine and sitting, transitional movement/bed mobility   Sherion Dooly B. Stevens Magwood, PT 11/06/23 9:11 PM [facility] [address] [place], [place] [address] Phone # [phone] Fax [phone]

## 2023-11-12 ENCOUNTER — Ambulatory Visit: Attending: Cardiology | Admitting: *Deleted

## 2023-11-12 DIAGNOSIS — I359 Nonrheumatic aortic valve disorder, unspecified: Secondary | ICD-10-CM | POA: Diagnosis not present

## 2023-11-12 DIAGNOSIS — Z7901 Long term (current) use of anticoagulants: Secondary | ICD-10-CM | POA: Diagnosis not present

## 2023-11-12 LAB — POCT INR: INR: 3.2 — AB (ref 2.0–3.0)

## 2023-11-12 NOTE — Progress Notes (Signed)
 Description   INR-3.2; Today take 5mg  of warfarin only then START taking 5mg  daily EXCEPT 7.5mg  on Mondays and Fridays. Have some leafy veggies today and remain consistent.  Stay consistent with greens each week.  Repeat INR in 4 weeks. Coumadin  Clinic # [phone]

## 2023-11-12 NOTE — Patient Instructions (Signed)
 Description   INR-3.2; Today take 5mg  of warfarin only then START taking 5mg  daily EXCEPT 7.5mg  on Mondays and Fridays. Have some leafy veggies today and remain consistent.  Stay consistent with greens each week.  Repeat INR in 4 weeks. Coumadin  Clinic # [phone]

## 2023-11-13 ENCOUNTER — Ambulatory Visit

## 2023-11-13 DIAGNOSIS — R252 Cramp and spasm: Secondary | ICD-10-CM

## 2023-11-13 DIAGNOSIS — M6281 Muscle weakness (generalized): Secondary | ICD-10-CM

## 2023-11-13 DIAGNOSIS — R262 Difficulty in walking, not elsewhere classified: Secondary | ICD-10-CM

## 2023-11-13 DIAGNOSIS — R293 Abnormal posture: Secondary | ICD-10-CM

## 2023-11-13 DIAGNOSIS — M5459 Other low back pain: Secondary | ICD-10-CM | POA: Diagnosis not present

## 2023-11-13 NOTE — Therapy (Signed)
 OUTPATIENT PHYSICAL THERAPY THORACOLUMBAR TREATMENT NOTE   Patient Name: Tamara Patterson MRN: [id number] DOB:12-09-1940, 83 y.o., female Today's Date: 11/13/2023  END OF SESSION:  PT End of Session - 11/13/23 1036     Visit Number 5    Number of Visits 8    Date for PT Re-Evaluation 12/25/23    Authorization Type [employer] MCR    Authorization Time Period [employer] approved 6 more visits from 10/30/23 through 12/25/23    Authorization - Visit Number 5    Authorization - Number of Visits 8    PT Start Time 1028    PT Stop Time 1100    PT Time Calculation (min) 32 min    Activity Tolerance Patient tolerated treatment well    Behavior During Therapy WFL for tasks assessed/performed          Past Medical History:  Diagnosis Date   Anticoagulant long-term use    CAD (coronary artery disease)    Depression    seasonal   Dyslipidemia    GERD (gastroesophageal reflux disease)    Giant cell arteritis (HCC)    per aortotomy biopsy in 1991   HTN (hypertension)    Hx of CABG 1991   LIMA-LAD, SVG-RCA, SVG-1st and 2ndOM   Panic anxiety syndrome    S/P AVR (aortic valve replacement)    with root grafting; #74mm  St. Jude valve conduit; Last Echo 2007   Past Surgical History:  Procedure Laterality Date   AORTIC VALVE REPLACEMENT     with root grafting   CARDIAC SURGERY     CHOLECYSTECTOMY     CORONARY ARTERY BYPASS GRAFT     Patient Active Problem List   Diagnosis Date Noted   Seasonal depression (HCC) 11/29/2017   Aortic valve disorder 06/26/2010   Long term current use of anticoagulant 06/26/2010   Palpitations 04/24/2010   S/P St Jude AVR '91    Hx of CABG-'91    Dyslipidemia    HTN (hypertension)    Panic anxiety syndrome    Giant cell arteritis (HCC)     PCP: Sun, Vyvyan, MD   REFERRING PROVIDER: Ishmael Slough, MD   REFERRING DIAG: M54.50  Low Back pain at multiple sites  Rationale for Evaluation and Treatment: Rehabilitation  THERAPY DIAG:  Other low back  pain  Difficulty in walking, not elsewhere classified  Muscle weakness (generalized)  Cramp and spasm  Abnormal posture  ONSET DATE: 08/08/23  SUBJECTIVE:  SUBJECTIVE STATEMENT: Patient was 14 min late for appt today.  Patient reports she had a set back since last visit.  I was doing one of my exercises and felt some pain in her back and has been feeling a little sore since that time.    PERTINENT HISTORY:   CAD, HTN  PAIN:  11/13/23 Are you having pain? Yes: NPRS scale: 0/10 (having some very mild discomfort from where she had the slight set back but its brief)  Pain location: low back (sometimes catches on the R) Pain description: achy soreness Aggravating factors: sitting a long time, first thing in the morning Relieving factors: moving around  PRECAUTIONS: None  RED FLAGS: None   WEIGHT BEARING RESTRICTIONS: No  FALLS:  Has patient fallen in last 6 months? Yes. Number of falls 1 slipped on a rug in the house and lightly sprained her L ankle.(Two weeks ago)  LIVING ENVIRONMENT: Lives with: lives with their spouse Lives in: House/apartment Stairs: No Has following equipment at home: None  OCCUPATION: retired  PLOF: Independent  PATIENT GOALS: learn how to stretch and be more flexible  NEXT MD VISIT: none scheduled  OBJECTIVE:  Note: Objective measures were completed at Evaluation unless otherwise noted.  DIAGNOSTIC FINDINGS:  none  PATIENT SURVEYS:  The Patient-Specific Functional Scale  Initial:  I am going to ask you to identify up to 3 important activities that you are unable to do or are having difficulty with as a result of this problem.  Today are there any activities that you are unable to do or having difficulty with because of this?  (Patient shown scale and  patient rated each activity)  Follow up: When you first came in you had difficulty performing these activities.  Today do you still have difficulty?  Patient-Specific activity scoring scheme (Point to one number):  0 1 2 3 4 5 6 7 8 9  10 Unable                                                                                                          Able to perform To perform                                                                                                    activity at the same Activity         Level as before  Injury or problem  Activity       Getting up from bed in the morning                                  Initial:           4-5            follow up 10/30/23: 5  2.        Getting in/out of car                                                           Initial:          4-5             follow up 10/30/23:  5  3.          Sit to stand                                                                        Initial:         4-5              follow up 10/30/23 : 5      COGNITION: Overall cognitive status: Within functional limits for tasks assessed      MUSCLE LENGTH:  HS R>L  piriformis L>R  POSTURE: No Significant postural limitations   PALPATION: Left gluteals and piriformis and lumbar TTP  LUMBAR ROM:  WFL pain with all motions except rotation  LOWER EXTREMITY ROM:   WFL for tasks assessed  LOWER EXTREMITY MMT:  5/5 except hip flex 4+/5  FUNCTIONAL TESTS:  5 times sit to stand: 12.48 sec   10/30/23: complete next visit (patient is moving much better,  she would likely score around 10 seconds based on her increased ease of movement)   TREATMENT DATE:                                                                                                                               11/13/23: 14 min late Nustep x 7 min level 5 (PT present to discuss  posture and tolerance to increased resistance, progress toward goals, proper knee and hip alignment on Nustep) Seated piriformis stretch 3 x 30 sec Supine PPT x 10 Supine PPT with 90/90 heel tap x 20 Supine PPT with dying bug x 20   Seated clam x 20 with red loop Hook lying  clam with red loop x 20 Standing HS stretch 2 x 30 sec each LE in // bars with chairs Standing quad/hip flexor stretch 2 x 30 sec each LE in // bars with chair   11/06/23: Nustep x 7 min level 5 (PT present to discuss posture and tolerance to increased resistance, progress toward goals, proper knee and hip alignment on Nustep) Standing HS stretch 2 x 30 sec each LE in // bars with chairs Standing quad/hip flexor stretch 2 x 30 sec each LE in // bars with chair Seated clam x 20 with red loop Hook lying clam with red loop x 20 Supine PPT x 20 Supine PPT with 90/90 heel tap x 20 Supine PPT with dying bug x 20   10/30/23: Nustep x 7 min level 5 (PT present to discuss posture and tolerance to increased resistance, progress toward goals, how to manage pain after long car rides) Standing HS stretch 3 x 30 sec each LE in // bars with chairs Standing quad/hip flexor stretch 3 x 30 sec each LE in // bars with chair Seated piriformis stretch 3 x 30 sec each LE Seated isometric ball press down with hands on knees using yellow physio ball in cx gym 10 x holding 5 seconds Seated mini sit ups with 5 lbs 2 x 10  Seated modified Russian twist with 5 lbs x 20 Seated Hip to shoulder with 5 lbs 2 x 10 Reviewed how to break up her HEP into stretching and strengthening. Instructed best results would come with doing these daily in the acute stages of her pain but she could then drop to every other day once she has good control over her pain, but realizing that a consistent stretching program is crucial as we age.     10/09/23: Nustep x 7 min level 2 Standing HS stretch 3 x 30 sec each LE Standing quad/hip flexor stretch 3 x 30 sec each  LE Seated piriformis stretch 3 x 30 sec each LE Seated mini sit ups with 5 lbs 2 x 10  Seated modified Russian twist with 5 lbs x 20 Seated Hip to shoulder with 5 lbs 2 x 10 Reviewed and completed all reps of HEP Provided copies of all HEP  09/08/23  See pt ed and HEP    PATIENT EDUCATION:  Education details: Reviewed how to break up her HEP into stretching and strengthening. Instructed best results would come with doing these daily in the acute stages of her pain but she could then drop to every other day once she has good control over her pain, but realizing that a consistent stretching program is crucial as we age.   Person educated: Patient Education method: Explanation, Demonstration, Tactile cues, Verbal cues, and Handouts Education comprehension: verbalized understanding and returned demonstration  HOME EXERCISE PROGRAM: Access Code: [email or web address] URL: [email or web address] Date: 11/06/2023 Prepared by: Delon Haddock  Exercises - Standing Hamstring Stretch on Chair  - 1 x daily - 7 x weekly - 1 sets - 3 reps - 30 sec hold - Quadricep Stretch with Chair and Counter Support  - 1 x daily - 7 x weekly - 1 sets - 3 reps - 30 sec hold - Seated Figure 4 Piriformis Stretch  - 2 x daily - 7 x weekly - 1 sets - 3 reps - 30 sec hold - Seated Pelvic Tilts  - 3 x daily - 7 x weekly - 1-2 sets - 10 reps - Supine Bridge  - 1 x daily -  7 x weekly - 1-3 sets - 10 reps - Supine Lower Trunk Rotation  - 1 x daily - 7 x weekly - 1 sets - 5-10 reps - Supine Posterior Pelvic Tilt  - 1 x daily - 7 x weekly - 1-2 sets - 10 reps - Supine 90/90 Alternating Heel Touches with Posterior Pelvic Tilt  - 1 x daily - 7 x weekly - 3 sets - 10 reps - Supine Dead Bug with Leg Extension  - 1 x daily - 7 x weekly - 3 sets - 10 reps - Sit to Stand with Arms Crossed  - 3 x daily - 7 x weekly - 1 sets - 5-10 reps  ASSESSMENT:  CLINICAL IMPRESSION: Kabella mentioned a set back while doing her exercises  since last visit but as we went through each exercise, she had no difficulty at all completing these with good form.  She seems to think that maybe she was feeling so good that she added some exercises that she had done previously and one of those resulted in the soreness.  She could not recall what it was but felt like she would stick to the exercises we prescribed to avoid this happening again.  We discussed discharge planning.  She is interested in continuing some form of formal exercise, possibly with a trainer.  We discussed the [facility] program at [facility].   She has an insurance that may pay for a membership there so she will call and do some research on this program so that she will be prepared to continue with her fitness goals at [place].  She will benefit from continued skilled PT to address these deficits.    OBJECTIVE IMPAIRMENTS: decreased mobility, decreased strength, increased muscle spasms, impaired flexibility, and pain.   ACTIVITY LIMITATIONS: bending, standing, transfers, bed mobility, and locomotion level  PARTICIPATION LIMITATIONS: painful but not limited  PERSONAL FACTORS: Age and 1 comorbidity: HTN are also affecting patient's functional outcome.   REHAB POTENTIAL: Excellent  CLINICAL DECISION MAKING: Stable/uncomplicated  EVALUATION COMPLEXITY: Low   GOALS: Goals reviewed with patient? Yes  SHORT TERM GOALS: Target date: 09/29/2023   Patient will be independent with initial HEP.  Baseline:  Goal status: MET 10/30/23  2.  Decreased pain in low back with transitional movements by 30%.  Baseline:  Goal status: MET 10/30/23   LONG TERM GOALS: Target date: 12/19/23   Patient will be independent with advanced/ongoing HEP to improve outcomes and carryover.  Baseline:  Goal status: Progressing  2.  Patient will report 75% improvement in low back pain with sit to stands, getting up from bed in the morning and car transfers. Baseline:  Goal status: In  progress  3.  Patient will demonstrate functional pain free lumbar ROM to perform ADLs.   Baseline:  Goal status: In Progress  4.  Patient will improve 2-3 points or better on the PSFS showing functional improvement   Baseline: 4-5/10 Goal status: In Progress  6.  Patient able to move in bed at previous functional level. Baseline:  Goal status: In Progress   PLAN:  PT FREQUENCY: 1x/week  PT DURATION: 6-8 sessions   PLANNED INTERVENTIONS: 97164- PT Re-evaluation, 97110-Therapeutic exercises, 97530- Therapeutic activity, [id number]- Neuromuscular re-education, 97535- Self Care, [id number]- Manual therapy, [id number]- Electrical stimulation (unattended), [id number]- Traction (mechanical), 20560 (1-2 muscles), 20561 (3+ muscles)- Dry Needling, Patient/Family education, Joint mobilization, Spinal mobilization, Cryotherapy, and Moist heat.  PLAN FOR NEXT SESSION: Continue to focus on hip flexibility (piriformis/HS) and hip  stability, progress core work and add into HEP, lumbar mobility in supine and sitting, transitional movement/bed mobility   Happy Begeman B. Shawanna Zanders, PT 11/13/23 11:09 AM [facility] [address], [address] [place], [place] [address] Phone # [phone] Fax [phone]

## 2023-11-20 ENCOUNTER — Ambulatory Visit: Admitting: Cardiology

## 2023-11-20 ENCOUNTER — Other Ambulatory Visit (HOSPITAL_BASED_OUTPATIENT_CLINIC_OR_DEPARTMENT_OTHER): Payer: Self-pay | Admitting: Family

## 2023-11-20 DIAGNOSIS — E785 Hyperlipidemia, unspecified: Secondary | ICD-10-CM

## 2023-11-21 ENCOUNTER — Other Ambulatory Visit (HOSPITAL_BASED_OUTPATIENT_CLINIC_OR_DEPARTMENT_OTHER): Payer: Self-pay | Admitting: Family

## 2023-11-21 DIAGNOSIS — E785 Hyperlipidemia, unspecified: Secondary | ICD-10-CM

## 2023-11-25 ENCOUNTER — Ambulatory Visit: Admitting: Physical Therapy

## 2023-11-28 ENCOUNTER — Ambulatory Visit: Attending: Rheumatology | Admitting: Physical Therapy

## 2023-11-28 ENCOUNTER — Encounter: Payer: Self-pay | Admitting: Physical Therapy

## 2023-11-28 DIAGNOSIS — R252 Cramp and spasm: Secondary | ICD-10-CM | POA: Diagnosis present

## 2023-11-28 DIAGNOSIS — R293 Abnormal posture: Secondary | ICD-10-CM | POA: Insufficient documentation

## 2023-11-28 DIAGNOSIS — M6281 Muscle weakness (generalized): Secondary | ICD-10-CM | POA: Insufficient documentation

## 2023-11-28 DIAGNOSIS — R262 Difficulty in walking, not elsewhere classified: Secondary | ICD-10-CM | POA: Diagnosis present

## 2023-11-28 DIAGNOSIS — M5459 Other low back pain: Secondary | ICD-10-CM | POA: Diagnosis present

## 2023-11-28 NOTE — Therapy (Signed)
 OUTPATIENT PHYSICAL THERAPY THORACOLUMBAR TREATMENT NOTE   Patient Name: Tamara Patterson MRN: [id number] DOB:Jul 31, 1940, 83 y.o., female Today's Date: 11/28/2023  END OF SESSION:  PT End of Session - 11/28/23 0848     Visit Number 6    Number of Visits 8    Date for PT Re-Evaluation 12/25/23    Authorization Type [employer] MCR    Authorization Time Period [employer] approved 6 more visits from 10/30/23 through 12/25/23    Authorization - Visit Number 6    Authorization - Number of Visits 8    Progress Note Due on Visit 10    PT Start Time [phone]    PT Stop Time 0930    PT Time Calculation (min) 41 min    Activity Tolerance Patient tolerated treatment well    Behavior During Therapy WFL for tasks assessed/performed          Past Medical History:  Diagnosis Date   Anticoagulant long-term use    CAD (coronary artery disease)    Depression    seasonal   Dyslipidemia    GERD (gastroesophageal reflux disease)    Giant cell arteritis (HCC)    per aortotomy biopsy in 1991   HTN (hypertension)    Hx of CABG 1991   LIMA-LAD, SVG-RCA, SVG-1st and 2ndOM   Panic anxiety syndrome    S/P AVR (aortic valve replacement)    with root grafting; #61mm  St. Jude valve conduit; Last Echo 2007   Past Surgical History:  Procedure Laterality Date   AORTIC VALVE REPLACEMENT     with root grafting   CARDIAC SURGERY     CHOLECYSTECTOMY     CORONARY ARTERY BYPASS GRAFT     Patient Active Problem List   Diagnosis Date Noted   Seasonal depression (HCC) 11/29/2017   Aortic valve disorder 06/26/2010   Long term current use of anticoagulant 06/26/2010   Palpitations 04/24/2010   S/P St Jude AVR '91    Hx of CABG-'91    Dyslipidemia    HTN (hypertension)    Panic anxiety syndrome    Giant cell arteritis (HCC)     PCP: Sun, Vyvyan, MD   REFERRING PROVIDER: Ishmael Slough, MD   REFERRING DIAG: M54.50  Low Back pain at multiple sites  Rationale for Evaluation and Treatment:  Rehabilitation  THERAPY DIAG:  Other low back pain  Difficulty in walking, not elsewhere classified  Muscle weakness (generalized)  Cramp and spasm  Abnormal posture  ONSET DATE: 08/08/23  SUBJECTIVE:  SUBJECTIVE STATEMENT: I am doing quite well.  Maybe a 1/10 but it is just stiffness.  PERTINENT HISTORY:   CAD, HTN  PAIN:  11/13/23 Are you having pain? Yes: NPRS scale: 1/10 (just stiffness)  Pain location: low back (sometimes catches on the R) Pain description: achy soreness Aggravating factors: sitting a long time, first thing in the morning Relieving factors: moving around  PRECAUTIONS: None  RED FLAGS: None   WEIGHT BEARING RESTRICTIONS: No  FALLS:  Has patient fallen in last 6 months? Yes. Number of falls 1 slipped on a rug in the house and lightly sprained her L ankle.(Two weeks ago)  LIVING ENVIRONMENT: Lives with: lives with their spouse Lives in: House/apartment Stairs: No Has following equipment at home: None  OCCUPATION: retired  PLOF: Independent  PATIENT GOALS: learn how to stretch and be more flexible  NEXT MD VISIT: none scheduled  OBJECTIVE:  Note: Objective measures were completed at Evaluation unless otherwise noted.  DIAGNOSTIC FINDINGS:  none  PATIENT SURVEYS:  The Patient-Specific Functional Scale  Initial:  I am going to ask you to identify up to 3 important activities that you are unable to do or are having difficulty with as a result of this problem.  Today are there any activities that you are unable to do or having difficulty with because of this?  (Patient shown scale and patient rated each activity)  Follow up: When you first came in you had difficulty performing these activities.  Today do you still have difficulty?  Patient-Specific  activity scoring scheme (Point to one number):  0 1 2 3 4 5 6 7 8 9  10 Unable                                                                                                          Able to perform To perform                                                                                                    activity at the same Activity         Level as before  Injury or problem  Activity       Getting up from bed in the morning                                  Initial:           4-5            follow up 10/30/23: 5  11/28/23: 7  2.        Getting in/out of car                                                           Initial:          4-5             follow up 10/30/23:  5  11/28/23: 7  3.          Sit to stand                                                                        Initial:         4-5              follow up 10/30/23 : 5  11/28/23: 6      COGNITION: Overall cognitive status: Within functional limits for tasks assessed      MUSCLE LENGTH:  HS R>L  piriformis L>R  POSTURE: No Significant postural limitations   PALPATION: Left gluteals and piriformis and lumbar TTP  LUMBAR ROM:  WFL pain with all motions except rotation  LOWER EXTREMITY ROM:   WFL for tasks assessed  LOWER EXTREMITY MMT:  5/5 except hip flex 4+/5  FUNCTIONAL TESTS:  5 times sit to stand: 12.48 sec   10/30/23: complete next visit (patient is moving much better,  she would likely score around 10 seconds based on her increased ease of movement)   TREATMENT DATE:                                                                                                                               11/28/23 NuStep x8' L5, PT present to update PSFS scores  Pt education about using a lumbar pillow or towel roll in chair to help ease the stiffness when going from sit to stand, especially in softer  chairs Seated piriformis stretch 2 x 30 sec Seated 5lb sit ups with chest press 2x10, seated russian twist 5lb 2x10  Seated clam red loop x20 Sit to stand from mat table with red loop at knees x10 Supine PPT x10 Supine PPT with 90/90 heel tap x 20 Supine PPT with dying bug x 20  Supine bridge 10x5 LTR x15 each way Seated ball press for TA indraw 10x5 Standing HS stretch 2 x 30 sec each LE in // bars with chairs Standing quad/hip flexor stretch 2 x 30 sec each LE in // bars with chair   11/13/23: 14 min late Nustep x 7 min level 5 (PT present to discuss posture and tolerance to increased resistance, progress toward goals, proper knee and hip alignment on Nustep) Seated piriformis stretch 3 x 30 sec Supine PPT x 10 Supine PPT with 90/90 heel tap x 20 Supine PPT with dying bug x 20   Seated clam x 20 with red loop Hook lying clam with red loop x 20 Standing HS stretch 2 x 30 sec each LE in // bars with chairs Standing quad/hip flexor stretch 2 x 30 sec each LE in // bars with chair   11/06/23: Nustep x 7 min level 5 (PT present to discuss posture and tolerance to increased resistance, progress toward goals, proper knee and hip alignment on Nustep) Standing HS stretch 2 x 30 sec each LE in // bars with chairs Standing quad/hip flexor stretch 2 x 30 sec each LE in // bars with chair Seated clam x 20 with red loop Hook lying clam with red loop x 20 Supine PPT x 20 Supine PPT with 90/90 heel tap x 20 Supine PPT with dying bug x 20   10/30/23: Nustep x 7 min level 5 (PT present to discuss posture and tolerance to increased resistance, progress toward goals, how to manage pain after long car rides) Standing HS stretch 3 x 30 sec each LE in // bars with chairs Standing quad/hip flexor stretch 3 x 30 sec each LE in // bars with chair Seated piriformis stretch 3 x 30 sec each LE Seated isometric ball press down with hands on knees using yellow physio ball in cx gym 10 x holding 5  seconds Seated mini sit ups with 5 lbs 2 x 10  Seated modified Russian twist with 5 lbs x 20 Seated Hip to shoulder with 5 lbs 2 x 10 Reviewed how to break up her HEP into stretching and strengthening. Instructed best results would come with doing these daily in the acute stages of her pain but she could then drop to every other day once she has good control over her pain, but realizing that a consistent stretching program is crucial as we age.     PATIENT EDUCATION:  Education details: Reviewed how to break up her HEP into stretching and strengthening. Instructed best results would come with doing these daily in the acute stages of her pain but she could then drop to every other day once she has good control over her pain, but realizing that a consistent stretching program is crucial as we age.   Person educated: Patient Education method: Explanation, Demonstration, Tactile cues, Verbal cues, and Handouts Education comprehension: verbalized understanding and returned demonstration  HOME EXERCISE PROGRAM: Access Code: [email or web address] URL: [email or web address] Date: 11/06/2023 Prepared by: Delon Haddock  Exercises - Standing Hamstring Stretch on Chair  - 1 x daily - 7 x weekly - 1 sets - 3 reps - 30 sec hold - Quadricep Stretch with Chair and Counter Support  - 1 x daily - 7 x weekly - 1 sets -  3 reps - 30 sec hold - Seated Figure 4 Piriformis Stretch  - 2 x daily - 7 x weekly - 1 sets - 3 reps - 30 sec hold - Seated Pelvic Tilts  - 3 x daily - 7 x weekly - 1-2 sets - 10 reps - Supine Bridge  - 1 x daily - 7 x weekly - 1-3 sets - 10 reps - Supine Lower Trunk Rotation  - 1 x daily - 7 x weekly - 1 sets - 5-10 reps - Supine Posterior Pelvic Tilt  - 1 x daily - 7 x weekly - 1-2 sets - 10 reps - Supine 90/90 Alternating Heel Touches with Posterior Pelvic Tilt  - 1 x daily - 7 x weekly - 3 sets - 10 reps - Supine Dead Bug with Leg Extension  - 1 x daily - 7 x weekly - 3 sets - 10  reps - Sit to Stand with Arms Crossed  - 3 x daily - 7 x weekly - 1 sets - 5-10 reps  ASSESSMENT:  CLINICAL IMPRESSION: Tamara Patterson reports improved scores on all 3 items within PSFS today.  She has some stiffness going from sit to stand especially after sitting in soft chair at home so we discussed adding lumbar support via pillow or towel roll to reduce this stiffness in her transition.  She was able to progress therex today and was mindful of knee alignment and hip mechanics after review of sit to stand alignment and target muscle use.  OBJECTIVE IMPAIRMENTS: decreased mobility, decreased strength, increased muscle spasms, impaired flexibility, and pain.   ACTIVITY LIMITATIONS: bending, standing, transfers, bed mobility, and locomotion level  PARTICIPATION LIMITATIONS: painful but not limited  PERSONAL FACTORS: Age and 1 comorbidity: HTN are also affecting patient's functional outcome.   REHAB POTENTIAL: Excellent  CLINICAL DECISION MAKING: Stable/uncomplicated  EVALUATION COMPLEXITY: Low   GOALS: Goals reviewed with patient? Yes  SHORT TERM GOALS: Target date: 09/29/2023   Patient will be independent with initial HEP.  Baseline:  Goal status: MET 10/30/23  2.  Decreased pain in low back with transitional movements by 30%.  Baseline:  Goal status: MET 10/30/23   LONG TERM GOALS: Target date: 12/19/23   Patient will be independent with advanced/ongoing HEP to improve outcomes and carryover.  Baseline:  Goal status: Progressing  2.  Patient will report 75% improvement in low back pain with sit to stands, getting up from bed in the morning and car transfers. Baseline:  Goal status: In progress  3.  Patient will demonstrate functional pain free lumbar ROM to perform ADLs.   Baseline:  Goal status: In Progress  4.  Patient will improve 2-3 points or better on the PSFS showing functional improvement   Baseline: 4-5/10 Goal status: PARTIALLY MET 11/28/23, still ongoing for sit to  stand  6.  Patient able to move in bed at previous functional level. Baseline:  Goal status: In Progress   PLAN:  PT FREQUENCY: 1x/week  PT DURATION: 6-8 sessions   PLANNED INTERVENTIONS: 97164- PT Re-evaluation, 97110-Therapeutic exercises, 97530- Therapeutic activity, [id number]- Neuromuscular re-education, 97535- Self Care, 02859- Manual therapy, G0283- Electrical stimulation (unattended), [phone]- Traction (mechanical), 20560 (1-2 muscles), 20561 (3+ muscles)- Dry Needling, Patient/Family education, Joint mobilization, Spinal mobilization, Cryotherapy, and Moist heat.  PLAN FOR NEXT SESSION: Continue to focus on hip flexibility (piriformis/HS) and hip stability, progress core work and add into HEP, lumbar mobility in supine and sitting, transitional movement/bed mobility  Darnesha Diloreto, PT 11/28/23 9:30 AM  [facility] [address], [address] [place], [place] [address] Phone # [phone] Fax [phone]

## 2023-12-02 ENCOUNTER — Ambulatory Visit: Admitting: Rehabilitative and Restorative Service Providers"

## 2023-12-10 ENCOUNTER — Ambulatory Visit: Attending: Cardiology | Admitting: *Deleted

## 2023-12-10 DIAGNOSIS — Z7901 Long term (current) use of anticoagulants: Secondary | ICD-10-CM | POA: Diagnosis not present

## 2023-12-10 DIAGNOSIS — I359 Nonrheumatic aortic valve disorder, unspecified: Secondary | ICD-10-CM

## 2023-12-10 LAB — POCT INR: INR: 3.3 — AB (ref 2.0–3.0)

## 2023-12-10 NOTE — Patient Instructions (Signed)
 Description   INR-3.3; Today take 2.5mg  (1/2 tablet) of warfarin then START taking 5mg  daily EXCEPT 7.5mg  on Fridays. Have some leafy veggies today and remain consistent.  Stay consistent with greens each week.  Repeat INR in 4 weeks. Coumadin  Clinic # [phone]

## 2023-12-10 NOTE — Progress Notes (Signed)
 Description   INR-3.3; Today take 2.5mg  (1/2 tablet) of warfarin then START taking 5mg  daily EXCEPT 7.5mg  on Fridays. Have some leafy veggies today and remain consistent.  Stay consistent with greens each week.  Repeat INR in 4 weeks. Coumadin  Clinic # [phone]

## 2023-12-11 ENCOUNTER — Ambulatory Visit

## 2023-12-11 DIAGNOSIS — R293 Abnormal posture: Secondary | ICD-10-CM

## 2023-12-11 DIAGNOSIS — M6281 Muscle weakness (generalized): Secondary | ICD-10-CM

## 2023-12-11 DIAGNOSIS — R252 Cramp and spasm: Secondary | ICD-10-CM

## 2023-12-11 DIAGNOSIS — R262 Difficulty in walking, not elsewhere classified: Secondary | ICD-10-CM

## 2023-12-11 DIAGNOSIS — M5459 Other low back pain: Secondary | ICD-10-CM | POA: Diagnosis not present

## 2023-12-11 NOTE — Therapy (Signed)
 OUTPATIENT PHYSICAL THERAPY THORACOLUMBAR TREATMENT NOTE   Patient Name: Tamara Patterson MRN: [id number] DOB:[birth year], 83 y.o., female Today's Date: 12/11/2023  END OF SESSION:  PT End of Session - 12/11/23 1023     Visit Number 7    Number of Visits 8    Date for Recertification  12/25/23    Authorization Type UHC [facility]    Authorization Time Period [employer] approved 6 more visits from 10/30/23 through 12/25/23    Authorization - Visit Number 7    Authorization - Number of Visits 8    PT Start Time 1015    PT Stop Time 1100    PT Time Calculation (min) 45 min    Activity Tolerance Patient tolerated treatment well    Behavior During Therapy WFL for tasks assessed/performed          Past Medical History:  Diagnosis Date   Anticoagulant long-term use    CAD (coronary artery disease)    Depression    seasonal   Dyslipidemia    GERD (gastroesophageal reflux disease)    Giant cell arteritis (HCC)    per aortotomy biopsy in 1991   HTN (hypertension)    Hx of CABG 1991   LIMA-LAD, SVG-RCA, SVG-1st and 2ndOM   Panic anxiety syndrome    S/P AVR (aortic valve replacement)    with root grafting; #17mm  St. Jude valve conduit; Last Echo 2007   Past Surgical History:  Procedure Laterality Date   AORTIC VALVE REPLACEMENT     with root grafting   CARDIAC SURGERY     CHOLECYSTECTOMY     CORONARY ARTERY BYPASS GRAFT     Patient Active Problem List   Diagnosis Date Noted   Seasonal depression 11/29/2017   Aortic valve disorder 06/26/2010   Long term current use of anticoagulant 06/26/2010   Palpitations 04/24/2010   S/P St Jude AVR '91    Hx of CABG-'91    Dyslipidemia    HTN (hypertension)    Panic anxiety syndrome    Giant cell arteritis (HCC)     PCP: Sun, Vyvyan, MD   REFERRING PROVIDER: Ishmael Slough, MD   REFERRING DIAG: M54.50  Low Back pain at multiple sites  Rationale for Evaluation and Treatment: Rehabilitation  THERAPY DIAG:  Other low back  pain  Muscle weakness (generalized)  Difficulty in walking, not elsewhere classified  Cramp and spasm  Abnormal posture  ONSET DATE: 08/08/23  SUBJECTIVE:  SUBJECTIVE STATEMENT: I am still doing well.  I plan on joining a yoga class when I'm done with therapy.    PERTINENT HISTORY:   CAD, HTN  PAIN:  12/11/23 Are you having pain? Yes: NPRS scale: 0/10 (just stiffness)  Pain location: low back (sometimes catches on the R) Pain description: achy soreness Aggravating factors: sitting a long time, first thing in the morning Relieving factors: moving around  PRECAUTIONS: None  RED FLAGS: None   WEIGHT BEARING RESTRICTIONS: No  FALLS:  Has patient fallen in last 6 months? Yes. Number of falls 1 slipped on a rug in the house and lightly sprained her L ankle.(Two weeks ago)  LIVING ENVIRONMENT: Lives with: lives with their spouse Lives in: House/apartment Stairs: No Has following equipment at home: None  OCCUPATION: retired  PLOF: Independent  PATIENT GOALS: learn how to stretch and be more flexible  NEXT MD VISIT: none scheduled  OBJECTIVE:  Note: Objective measures were completed at Evaluation unless otherwise noted.  DIAGNOSTIC FINDINGS:  none  PATIENT SURVEYS:  The Patient-Specific Functional Scale  Initial:  I am going to ask you to identify up to 3 important activities that you are unable to do or are having difficulty with as a result of this problem.  Today are there any activities that you are unable to do or having difficulty with because of this?  (Patient shown scale and patient rated each activity)  Follow up: When you first came in you had difficulty performing these activities.  Today do you still have difficulty?  Patient-Specific activity scoring scheme (Point to  one number):  0 1 2 3 4 5 6 7 8 9  10 Unable                                                                                                          Able to perform To perform                                                                                                    activity at the same Activity         Level as before  Injury or problem  Activity       Getting up from bed in the morning                                  Initial:           4-5            follow up 10/30/23: 5  11/28/23: 7  2.        Getting in/out of car                                                           Initial:          4-5             follow up 10/30/23:  5  11/28/23: 7  3.          Sit to stand                                                                        Initial:         4-5              follow up 10/30/23 : 5  11/28/23: 6      COGNITION: Overall cognitive status: Within functional limits for tasks assessed      MUSCLE LENGTH:  HS R>L  piriformis L>R  POSTURE: No Significant postural limitations   PALPATION: Left gluteals and piriformis and lumbar TTP  LUMBAR ROM:  WFL pain with all motions except rotation  LOWER EXTREMITY ROM:   WFL for tasks assessed  LOWER EXTREMITY MMT:  5/5 except hip flex 4+/5  FUNCTIONAL TESTS:  5 times sit to stand: 12.48 sec   10/30/23: complete next visit (patient is moving much better,  she would likely score around 10 seconds based on her increased ease of movement)   TREATMENT DATE:                                                                                                                               12/11/23 NuStep x8' L5, PT present to update PSFS scores  Seated piriformis stretch 3 x 30 sec on each side Lateral band walks with blue loop x 3 laps of 10 steps each direction Seated clam with yellow and red loops x 20 Hook lying  clam with yellow and red loops x 20 Sidelying clam with yellow  loop 2 x 10 each LE Supine PPT x10 Supine PPT with 90/90 heel tap x 20 Supine PPT with dying bug x 20  Supine PPT with bridge and clamshell with yellow loop 10x5 LTR x10 each way Supine IT band stretch with strap 5 x 10 seconds each Seated 10lb kb sit ups 2x10   11/28/23 NuStep x8' L5, PT present to update PSFS scores  Pt education about using a lumbar pillow or towel roll in chair to help ease the stiffness when going from sit to stand, especially in softer chairs Seated piriformis stretch 2 x 30 sec Seated 5lb sit ups with chest press 2x10, seated russian twist 5lb 2x10 Seated clam red loop x20 Sit to stand from mat table with red loop at knees x10 Supine PPT x10 Supine PPT with 90/90 heel tap x 20 Supine PPT with dying bug x 20  Supine bridge 10x5 LTR x15 each way Seated ball press for TA indraw 10x5 Standing HS stretch 2 x 30 sec each LE in // bars with chairs Standing quad/hip flexor stretch 2 x 30 sec each LE in // bars with chair   11/13/23: 14 min late Nustep x 7 min level 5 (PT present to discuss posture and tolerance to increased resistance, progress toward goals, proper knee and hip alignment on Nustep) Seated piriformis stretch 3 x 30 sec Supine PPT x 10 Supine PPT with 90/90 heel tap x 20 Supine PPT with dying bug x 20  Seated clam x 20 with red loop Hook lying clam with red loop x 20 Standing HS stretch 2 x 30 sec each LE in // bars with chairs Standing quad/hip flexor stretch 2 x 30 sec each LE in // bars with chair    PATIENT EDUCATION:  Education details: Reviewed how to break up her HEP into stretching and strengthening. Instructed best results would come with doing these daily in the acute stages of her pain but she could then drop to every other day once she has good control over her pain, but realizing that a consistent stretching program is crucial as we age.   Person educated:  Patient Education method: Explanation, Demonstration, Tactile cues, Verbal cues, and Handouts Education comprehension: verbalized understanding and returned demonstration  HOME EXERCISE PROGRAM: Access Code: [email or web address] URL: [email or web address] Date: 11/06/2023 Prepared by: Delon Haddock  Exercises - Standing Hamstring Stretch on Chair  - 1 x daily - 7 x weekly - 1 sets - 3 reps - 30 sec hold - Quadricep Stretch with Chair and Counter Support  - 1 x daily - 7 x weekly - 1 sets - 3 reps - 30 sec hold - Seated Figure 4 Piriformis Stretch  - 2 x daily - 7 x weekly - 1 sets - 3 reps - 30 sec hold - Seated Pelvic Tilts  - 3 x daily - 7 x weekly - 1-2 sets - 10 reps - Supine Bridge  - 1 x daily - 7 x weekly - 1-3 sets - 10 reps - Supine Lower Trunk Rotation  - 1 x daily - 7 x weekly - 1 sets - 5-10 reps - Supine Posterior Pelvic Tilt  - 1 x daily - 7 x weekly - 1-2 sets - 10 reps - Supine 90/90 Alternating Heel Touches with Posterior Pelvic Tilt  - 1 x daily - 7 x weekly - 3 sets - 10 reps - Supine Dead Bug with Leg Extension  - 1 x daily - 7 x weekly - 3 sets -  10 reps - Sit to Stand with Arms Crossed  - 3 x daily - 7 x weekly - 1 sets - 5-10 reps  ASSESSMENT:  CLINICAL IMPRESSION: Keith is progressing very well.  She has no pain to speak of.  She is compliant and well motivated and plans on continuing with yoga and her HEP once she is discharged from PT.  She has one visit left.  We will do DC planning next visit and complete DC assessment.    OBJECTIVE IMPAIRMENTS: decreased mobility, decreased strength, increased muscle spasms, impaired flexibility, and pain.   ACTIVITY LIMITATIONS: bending, standing, transfers, bed mobility, and locomotion level  PARTICIPATION LIMITATIONS: painful but not limited  PERSONAL FACTORS: Age and 1 comorbidity: HTN are also affecting patient's functional outcome.   REHAB POTENTIAL: Excellent  CLINICAL DECISION MAKING:  Stable/uncomplicated  EVALUATION COMPLEXITY: Low   GOALS: Goals reviewed with patient? Yes  SHORT TERM GOALS: Target date: 09/29/2023   Patient will be independent with initial HEP.  Baseline:  Goal status: MET 10/30/23  2.  Decreased pain in low back with transitional movements by 30%.  Baseline:  Goal status: MET 10/30/23   LONG TERM GOALS: Target date: 12/19/23   Patient will be independent with advanced/ongoing HEP to improve outcomes and carryover.  Baseline:  Goal status: Progressing  2.  Patient will report 75% improvement in low back pain with sit to stands, getting up from bed in the morning and car transfers. Baseline:  Goal status: In progress  3.  Patient will demonstrate functional pain free lumbar ROM to perform ADLs.   Baseline:  Goal status: In Progress  4.  Patient will improve 2-3 points or better on the PSFS showing functional improvement   Baseline: 4-5/10 Goal status: PARTIALLY MET 11/28/23, still ongoing for sit to stand  6.  Patient able to move in bed at previous functional level. Baseline:  Goal status: In Progress   PLAN:  PT FREQUENCY: 1x/week  PT DURATION: 6-8 sessions   PLANNED INTERVENTIONS: 97164- PT Re-evaluation, 97110-Therapeutic exercises, 97530- Therapeutic activity, [id number]- Neuromuscular re-education, 97535- Self Care, 02859- Manual therapy, G0283- Electrical stimulation (unattended), [phone]- Traction (mechanical), 20560 (1-2 muscles), 20561 (3+ muscles)- Dry Needling, Patient/Family education, Joint mobilization, Spinal mobilization, Cryotherapy, and Moist heat.  PLAN FOR NEXT SESSION: DC next visit.    Delon B. Lama Narayanan, PT 12/11/23 11:38 AM [facility] [address], [address] [place], [place] [address] Phone # [phone] Fax [phone]

## 2023-12-18 ENCOUNTER — Ambulatory Visit: Attending: Rheumatology | Admitting: Physical Therapy

## 2023-12-18 ENCOUNTER — Encounter: Payer: Self-pay | Admitting: Physical Therapy

## 2023-12-18 DIAGNOSIS — R293 Abnormal posture: Secondary | ICD-10-CM | POA: Insufficient documentation

## 2023-12-18 DIAGNOSIS — R262 Difficulty in walking, not elsewhere classified: Secondary | ICD-10-CM | POA: Insufficient documentation

## 2023-12-18 DIAGNOSIS — R252 Cramp and spasm: Secondary | ICD-10-CM | POA: Insufficient documentation

## 2023-12-18 DIAGNOSIS — M5459 Other low back pain: Secondary | ICD-10-CM | POA: Diagnosis present

## 2023-12-18 DIAGNOSIS — M6281 Muscle weakness (generalized): Secondary | ICD-10-CM | POA: Diagnosis present

## 2023-12-18 NOTE — Therapy (Signed)
 OUTPATIENT PHYSICAL THERAPY THORACOLUMBAR TREATMENT AND DISCHARGE NOTE   Patient Name: Tamara Patterson MRN: [id number] DOB:[birth year], 83 y.o., female Today's Date: 12/18/2023  END OF SESSION:  PT End of Session - 12/18/23 1317     Visit Number 8    Number of Visits 8    Date for Recertification  12/25/23    Authorization Type [employer] MCR    Authorization Time Period [employer] approved 6 more visits from 10/30/23 through 12/25/23    Authorization - Visit Number 8    Authorization - Number of Visits 8    PT Start Time 1230    PT Stop Time 1315    PT Time Calculation (min) 45 min           Past Medical History:  Diagnosis Date   Anticoagulant long-term use    CAD (coronary artery disease)    Depression    seasonal   Dyslipidemia    GERD (gastroesophageal reflux disease)    Giant cell arteritis (HCC)    per aortotomy biopsy in 1991   HTN (hypertension)    Hx of CABG 1991   LIMA-LAD, SVG-RCA, SVG-1st and 2ndOM   Panic anxiety syndrome    S/P AVR (aortic valve replacement)    with root grafting; #36mm  St. Jude valve conduit; Last Echo 2007   Past Surgical History:  Procedure Laterality Date   AORTIC VALVE REPLACEMENT     with root grafting   CARDIAC SURGERY     CHOLECYSTECTOMY     CORONARY ARTERY BYPASS GRAFT     Patient Active Problem List   Diagnosis Date Noted   Seasonal depression 11/29/2017   Aortic valve disorder 06/26/2010   Long term current use of anticoagulant 06/26/2010   Palpitations 04/24/2010   S/P St Jude AVR '91    Hx of CABG-'91    Dyslipidemia    HTN (hypertension)    Panic anxiety syndrome    Giant cell arteritis (HCC)     PCP: Sun, Vyvyan, MD   REFERRING PROVIDER: Ishmael Slough, MD   REFERRING DIAG: M54.50  Low Back pain at multiple sites  Rationale for Evaluation and Treatment: Rehabilitation  THERAPY DIAG:  Other low back pain  Muscle weakness (generalized)  Difficulty in walking, not elsewhere classified  Cramp and  spasm  Abnormal posture  ONSET DATE: 08/08/23  SUBJECTIVE:                                                                                                                                                                                           SUBJECTIVE STATEMENT: I am much improved - I have remaining  stiffness after I sit but I understand it and know what to do with it now to make it better.  PERTINENT HISTORY:   CAD, HTN  PAIN:  12/11/23 Are you having pain? Yes: NPRS scale: 0/10 (just stiffness)  Pain location: low back (sometimes catches on the R) Pain description: achy soreness Aggravating factors: sitting a long time, first thing in the morning Relieving factors: moving around  PRECAUTIONS: None  RED FLAGS: None   WEIGHT BEARING RESTRICTIONS: No  FALLS:  Has patient fallen in last 6 months? Yes. Number of falls 1 slipped on a rug in the house and lightly sprained her L ankle.(Two weeks ago)  LIVING ENVIRONMENT: Lives with: lives with their spouse Lives in: House/apartment Stairs: No Has following equipment at home: None  OCCUPATION: retired  PLOF: Independent  PATIENT GOALS: learn how to stretch and be more flexible  NEXT MD VISIT: none scheduled  OBJECTIVE:  Note: Objective measures were completed at Evaluation unless otherwise noted.  DIAGNOSTIC FINDINGS:  none  PATIENT SURVEYS:  The Patient-Specific Functional Scale  Initial:  I am going to ask you to identify up to 3 important activities that you are unable to do or are having difficulty with as a result of this problem.  Today are there any activities that you are unable to do or having difficulty with because of this?  (Patient shown scale and patient rated each activity)  Follow up: When you first came in you had difficulty performing these activities.  Today do you still have difficulty?  Patient-Specific activity scoring scheme (Point to one number):  0 1 2 3 4 5 6 7 8 9  10 Unable                                                                                                           Able to perform To perform                                                                                                    activity at the same Activity         Level as before  Injury or problem  Activity       Getting up from bed in the morning                                  Initial:           4-5            follow up 10/30/23: 5  11/28/23: 7  12/18/23: 8  2.        Getting in/out of car                                                           Initial:          4-5             follow up 10/30/23:  5  11/28/23: 7  12/18/23: 8  3.          Sit to stand                                                                        Initial:         4-5              follow up 10/30/23 : 5  11/28/23: 6  12/18/23: 8      COGNITION: Overall cognitive status: Within functional limits for tasks assessed      MUSCLE LENGTH: 12/18/23: WFL bil  Eval:  HS R>L  piriformis L>R  POSTURE: No Significant postural limitations   PALPATION: 12/18/23: signif less tenderness surrounding Lt hip and lumbar region  Eval: Left gluteals and piriformis and lumbar TTP  LUMBAR ROM:  WFL pain with all motions except rotation  LOWER EXTREMITY ROM:   WFL for tasks assessed  LOWER EXTREMITY MMT:   12/18/23: hip flexion 5/5  5/5 except hip flex 4+/5  FUNCTIONAL TESTS:  12/18/23 5X STS: 11.32 sec   EVAL: 5 times sit to stand: 12.48 sec     TREATMENT DATE:                                                                                                                               12/18/23 NuStep x10' L5 PT present to update goals  Lumbar ROM, LE strength and 5x STS measured (see above) Seated clam with yellow and red loops x 20 Sidestepping blue loop at knees 5 steps each way x5 laps Made blue and  black tied loops for HEP (currently at blue level but can work towards black) Updated HEP to include balance and performed trial of each one: standing heel/toe raises, SLS and tandem stance - at first with hands on wall, then down to no UE assist Pt educ: how to progress balance by adding eyes closed, UE movements, head turns Updated HEP - printed and emailed  12/11/23 NuStep x8' L5, PT present to update PSFS scores  Seated piriformis stretch 3 x 30 sec on each side Lateral band walks with blue loop x 3 laps of 10 steps each direction Seated clam with yellow and red loops x 20 Hook lying clam with yellow and red loops x 20 Sidelying clam with yellow loop 2 x 10 each LE Supine PPT x10 Supine PPT with 90/90 heel tap x 20 Supine PPT with dying bug x 20  Supine PPT with bridge and clamshell with yellow loop 10x5 LTR x10 each way Supine IT band stretch with strap 5 x 10 seconds each Seated 10lb kb sit ups 2x10   11/28/23 NuStep x8' L5, PT present to update PSFS scores  Pt education about using a lumbar pillow or towel roll in chair to help ease the stiffness when going from sit to stand, especially in softer chairs Seated piriformis stretch 2 x 30 sec Seated 5lb sit ups with chest press 2x10, seated russian twist 5lb 2x10 Seated clam red loop x20 Sit to stand from mat table with red loop at knees x10 Supine PPT x10 Supine PPT with 90/90 heel tap x 20 Supine PPT with dying bug x 20  Supine bridge 10x5 LTR x15 each way Seated ball press for TA indraw 10x5 Standing HS stretch 2 x 30 sec each LE in // bars with chairs Standing quad/hip flexor stretch 2 x 30 sec each LE in // bars with chair   PATIENT EDUCATION:  Education details: Reviewed how to break up her HEP into stretching and strengthening. Instructed best results would come with doing these daily in the acute stages of her pain but she could then drop to every other day once she has good control over her pain, but realizing that  a consistent stretching program is crucial as we age.   Person educated: Patient Education method: Explanation, Demonstration, Tactile cues, Verbal cues, and Handouts Education comprehension: verbalized understanding and returned demonstration  HOME EXERCISE PROGRAM: Access Code: [email or web address] URL: [email or web address] Date: 11/06/2023 Prepared by: Delon Haddock  Exercises - Standing Hamstring Stretch on Chair  - 1 x daily - 7 x weekly - 1 sets - 3 reps - 30 sec hold - Quadricep Stretch with Chair and Counter Support  - 1 x daily - 7 x weekly - 1 sets - 3 reps - 30 sec hold - Seated Figure 4 Piriformis Stretch  - 2 x daily - 7 x weekly - 1 sets - 3 reps - 30 sec hold - Seated Pelvic Tilts  - 3 x daily - 7 x weekly - 1-2 sets - 10 reps - Supine Bridge  - 1 x daily - 7 x weekly - 1-3 sets - 10 reps - Supine Lower Trunk Rotation  - 1 x daily - 7 x weekly - 1 sets - 5-10 reps - Supine Posterior Pelvic Tilt  - 1 x daily - 7 x weekly - 1-2 sets - 10 reps - Supine 90/90 Alternating Heel Touches with Posterior Pelvic Tilt  - 1 x daily - 7 x weekly - 3 sets - 10  reps - Supine Dead Bug with Leg Extension  - 1 x daily - 7 x weekly - 3 sets - 10 reps - Sit to Stand with Arms Crossed  - 3 x daily - 7 x weekly - 1 sets - 5-10 reps  ASSESSMENT:  CLINICAL IMPRESSION: Auriel is progressing very well.  She has no pain to speak of.  She is compliant and well motivated and plans on continuing with yoga and her HEP. She has met all goals. We will DC to HEP at this time with understand she may return with a new PT Rx as needed for any set backs.  OBJECTIVE IMPAIRMENTS: decreased mobility, decreased strength, increased muscle spasms, impaired flexibility, and pain.   ACTIVITY LIMITATIONS: bending, standing, transfers, bed mobility, and locomotion level  PARTICIPATION LIMITATIONS: painful but not limited  PERSONAL FACTORS: Age and 1 comorbidity: HTN are also affecting patient's functional  outcome.   REHAB POTENTIAL: Excellent  CLINICAL DECISION MAKING: Stable/uncomplicated  EVALUATION COMPLEXITY: Low   GOALS: Goals reviewed with patient? Yes  SHORT TERM GOALS: Target date: 09/29/2023   Patient will be independent with initial HEP.  Baseline:  Goal status: MET 10/30/23  2.  Decreased pain in low back with transitional movements by 30%.  Baseline:  Goal status: MET 10/30/23   LONG TERM GOALS: Target date: 12/19/23   Patient will be independent with advanced/ongoing HEP to improve outcomes and carryover.  Baseline:  Goal status: MET 12/18/23  2.  Patient will report 75% improvement in low back pain with sit to stands, getting up from bed in the morning and car transfers. Baseline:  Goal status: MET 12/18/23  3.  Patient will demonstrate functional pain free lumbar ROM to perform ADLs.   Baseline:  Goal status: MET 12/18/23  4.  Patient will improve 2-3 points or better on the PSFS showing functional improvement   Baseline: 4-5/10 Goal status: MET 12/18/23  6.  Patient able to move in bed at previous functional level. Baseline:  Goal status: MET 12/18/23   PLAN:  PT FREQUENCY: 1x/week  PT DURATION: 6-8 sessions   PLANNED INTERVENTIONS: 97164- PT Re-evaluation, 97110-Therapeutic exercises, 97530- Therapeutic activity, 97112- Neuromuscular re-education, 97535- Self Care, 02859- Manual therapy, [id number]- Electrical stimulation (unattended), [phone]- Traction (mechanical), 20560 (1-2 muscles), 20561 (3+ muscles)- Dry Needling, Patient/Family education, Joint mobilization, Spinal mobilization, Cryotherapy, and Moist heat.  PLAN FOR NEXT SESSION: DC to HEP    PHYSICAL THERAPY DISCHARGE SUMMARY  Visits from Start of Care: 8   Current functional level related to goals / functional outcomes: See above   Remaining deficits: See above   Education / Equipment: HEP   Patient agrees to discharge. Patient goals were met. Patient is being discharged due to  meeting the stated rehab goals.   Orvil Fester, PT 12/18/23 1:18 PM  [facility] [address] [place], [place] [address] Phone # [phone] Fax [phone]

## 2023-12-23 ENCOUNTER — Encounter: Admitting: Rehabilitative and Restorative Service Providers"

## 2024-01-07 ENCOUNTER — Ambulatory Visit

## 2024-01-08 ENCOUNTER — Ambulatory Visit: Attending: Cardiology | Admitting: *Deleted

## 2024-01-08 DIAGNOSIS — Z952 Presence of prosthetic heart valve: Secondary | ICD-10-CM | POA: Diagnosis not present

## 2024-01-08 DIAGNOSIS — Z7901 Long term (current) use of anticoagulants: Secondary | ICD-10-CM | POA: Diagnosis not present

## 2024-01-08 DIAGNOSIS — I359 Nonrheumatic aortic valve disorder, unspecified: Secondary | ICD-10-CM | POA: Diagnosis not present

## 2024-01-08 DIAGNOSIS — Z5181 Encounter for therapeutic drug level monitoring: Secondary | ICD-10-CM | POA: Diagnosis not present

## 2024-01-08 LAB — POCT INR: POC INR: 2.3

## 2024-01-08 NOTE — Patient Instructions (Signed)
 Description   INR-2.3; Continue taking warfarin 5mg  daily EXCEPT 7.5mg  on Fridays.  Stay consistent with greens each week.  Repeat INR in 6 weeks. Coumadin  Clinic # [phone]

## 2024-01-08 NOTE — Progress Notes (Signed)
 Lab Results  Component Value Date   INR 2.3 01/08/2024   INR 3.3 (A) 12/10/2023   INR 3.2 (A) 11/12/2023    Description   INR-2.3; Continue taking warfarin 5mg  daily EXCEPT 7.5mg  on Fridays.  Stay consistent with greens each week.  Repeat INR in 6 weeks. Coumadin  Clinic # [phone]

## 2024-02-16 NOTE — Progress Notes (Signed)
 Tamara Patterson Date of Birth: 20-Nov-1940   History of Present Illness: Tamara Patterson is seen today for followup. 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 This included mechanical aortic valve replacement and grafting of the aortic root with a #23 mm St. Jude valve conduit. She also had coronary bypass surgery including an IMA graft to the LAD, saphenous vein graft to the right coronary, and saphenous vein graft to the first and second obtuse marginal vessels. 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 She was treated with pulse steroids for a period of time and was weaned off of this. Last Myoview  study was in January 2018 and was normal. Echo in 2011 was satisfactory.  She has been intolerant of statins and Zetia  due to myalgias. Also intolerant of Welchol  due to hypoglycemia.  Echo in Jan 2023 showed normal LV function and normal prosthetic AV function.  On follow up today she is doing well. Energy level is good.  She is walking and cycling regularly. No chest pain, dyspnea, palpitations, dizziness.  No edema. No bleeding. BP has been well controlled.   Current Outpatient Medications on File Prior to Visit  Medication Sig Dispense Refill   amLODipine  (NORVASC ) 5 MG tablet Take 1 tablet (5 mg total) by mouth daily. 180 tablet 3   Barberry-Oreg Grape-Goldenseal (BERBERINE COMPLEX PO) Take 2 tablets by mouth daily.     CEQUA 0.09 % SOLN Apply 1 drop to eye 2 (two) times daily.     Cholecalciferol (VITAMIN D3) 2000 units TABS Take 2,000 Units by mouth daily.     clobetasol ointment (TEMOVATE) 0.05 % clobetasol 0.05 % topical ointment  APPLY OINTMENT TOPICALLY TO AFFECTED AREA TWICE DAILY (Patient taking differently: Apply 1 Application topically as needed.)     escitalopram  (LEXAPRO ) 20 MG tablet Take 1 tablet (20 mg total) by mouth daily. 90 tablet 3   rosuvastatin  (CRESTOR ) 5 MG tablet TAKE 1 TABLET BY MOUTH  TWICE A WEEK 24 tablet 3   warfarin (COUMADIN ) 5 MG tablet TAKE 1 TO 1 & 1/2 (ONE & ONE-HALF) TABLETS BY MOUTH ONCE DAILY AS  DIRECTED  BY  COUMADIN   CLINIC 120 tablet 1   No current facility-administered medications on file prior to visit.    Allergies  Allergen Reactions   Codeine Nausea Only   Lipitor [Atorvastatin Calcium ] Other (See Comments)    Caused muscle aching   Atenolol Other (See Comments)    cramps    Past Medical History:  Diagnosis Date   Anticoagulant long-term use    CAD (coronary artery disease)    Depression    seasonal   Dyslipidemia    GERD (gastroesophageal reflux disease)    Giant cell arteritis (HCC)    per aortotomy biopsy in 1991   HTN (hypertension)    Hx of CABG 1991   LIMA-LAD, SVG-RCA, SVG-1st and 2ndOM   Panic anxiety syndrome    S/P AVR (aortic valve replacement)    with root grafting; #95mm  St. Jude valve conduit; Last Echo 2007    Past Surgical History:  Procedure Laterality Date   AORTIC VALVE REPLACEMENT     with root grafting   CARDIAC SURGERY     CHOLECYSTECTOMY     CORONARY ARTERY BYPASS GRAFT      Social History   Tobacco Use  Smoking Status Former  Smokeless Tobacco Never    Social History   Substance and  Sexual Activity  Alcohol Use No    Family History  Problem Relation Age of Onset   Liver disease Mother    Pancreatic cancer Father     Review of Systems: As noted in history of present illness. All other systems were reviewed and are negative.  Physical Exam: BP 124/72   Pulse 75   Ht 5' 5 (1.651 m)   Wt 161 lb 9.6 oz (73.3 kg)   SpO2 98%   BMI 26.89 kg/m  GENERAL:  Well appearing BF in NAD HEENT:  PERRL, EOMI, sclera are clear. Oropharynx is clear. NECK:  No jugular venous distention, carotid upstroke brisk and symmetric, no bruits, no thyromegaly or adenopathy LUNGS:  Clear to auscultation bilaterally CHEST:  Unremarkable HEART:  RRR,  PMI not displaced or sustained,good mechanical AV click,  no S3, no S4: no clicks, no rubs, no murmurs ABD:  Soft, nontender. BS +, no masses or bruits. No hepatomegaly, no splenomegaly EXT:  2 + pulses throughout, no edema, no cyanosis no clubbing SKIN:  Warm and dry.  No rashes NEURO:  Alert and oriented x 3. Cranial nerves II through XII intact. PSYCH:  Cognitively intact    LABORATORY DATA:  Lab Results  Component Value Date   WBC 4.9 02/19/2023   HGB 12.7 02/19/2023   HCT 40.7 02/19/2023   PLT 221 02/19/2023   GLUCOSE 88 02/19/2023   CHOL 154 02/19/2023   TRIG 82 02/19/2023   HDL 57 02/19/2023   LDLDIRECT 126.4 10/07/2012   LDLCALC 81 02/19/2023   ALT 18 02/19/2023   AST 24 02/19/2023   NA 144 02/19/2023   K 4.7 02/19/2023   CL 106 02/19/2023   CREATININE 0.85 02/19/2023   BUN 17 02/19/2023   CO2 27 02/19/2023   INR 2.7 02/19/2024   HGBA1C 5.8 (H) 02/19/2023   Labs from primary care dated 05/02/15: normal CMET Dated 05/25/15: A1c5.8% Dated 11/10/15: cholesterol 177, triglycerides 91, LDL 110, HDL 49. Dated 05/17/19: glucose 114. A1c 5.6%. BMET otherwise normal. Dated 03/14/20: normal BMET       Myoview  03/20/16: Study Highlights     The left ventricular ejection fraction is normal (55-65%). Nuclear stress EF: 64%. There was no ST segment deviation noted during stress. Blood pressure demonstrated a normal response to exercise.   Normal study, no evidence for ischemia or infarction   Echo 03/29/21: IMPRESSIONS     1. Left ventricular ejection fraction, by estimation, is 60 to 65%. The  left ventricle has normal function. The left ventricle has no regional  wall motion abnormalities. Left ventricular diastolic parameters were  normal.   2. Right ventricular systolic function is mildly reduced. The right  ventricular size is normal.   3. The mitral valve is normal in structure. Mild mitral valve  regurgitation.   4. The aortic valve has been repaired/replaced. Aortic valve  regurgitation is trivial.   5. Pulmonic  valve regurgitation is moderate.   Assessment / Plan: 1. Status post mechanical aortic valve replacement.  Exam is stable. INR has been therapeutic.   Routine SBE prophylaxis. Echo in Jan 2023 showed normal valve function.  2. Status post CABG for ostial coronary disease. No angina. Myoview  03/20/16 was normal. Continue current medical therapy. She is asymptomatic.   3. Hyperlipidemia.   On very low dose Crestor  twice a week. Intolerant of multiple meds. Will continue current therapy. Last LDL 81. Will update complete labs today  4. Right bundle branch block/LAFB- chronic. She is asymptomatic.  5. HTN  excellent control    6. Prediabetes. Per PCP. Check A1c    I will follow up in 6 months

## 2024-02-19 ENCOUNTER — Ambulatory Visit: Attending: Cardiology

## 2024-02-19 DIAGNOSIS — Z7901 Long term (current) use of anticoagulants: Secondary | ICD-10-CM | POA: Diagnosis not present

## 2024-02-19 DIAGNOSIS — I359 Nonrheumatic aortic valve disorder, unspecified: Secondary | ICD-10-CM

## 2024-02-19 LAB — POCT INR: INR: 2.7 (ref 2.0–3.0)

## 2024-02-19 NOTE — Patient Instructions (Signed)
 Continue taking warfarin 5mg  daily EXCEPT 7.5mg  on Fridays.  Stay consistent with greens each week.  Repeat INR in 6 weeks. Coumadin  Clinic # [phone]

## 2024-02-19 NOTE — Progress Notes (Signed)
 INR 2.7 Please see anticoagulation encounter Continue taking warfarin 5mg  daily EXCEPT 7.5mg  on Fridays.  Stay consistent with greens each week.  Repeat INR in 6 weeks. Coumadin  Clinic # [phone]

## 2024-02-26 ENCOUNTER — Ambulatory Visit: Attending: Internal Medicine | Admitting: Cardiology

## 2024-02-26 ENCOUNTER — Encounter: Payer: Self-pay | Admitting: Cardiology

## 2024-02-26 VITALS — BP 124/72 | HR 75 | Ht 65.0 in | Wt 161.6 lb

## 2024-02-26 DIAGNOSIS — Z952 Presence of prosthetic heart valve: Secondary | ICD-10-CM

## 2024-02-26 DIAGNOSIS — I10 Essential (primary) hypertension: Secondary | ICD-10-CM | POA: Diagnosis not present

## 2024-02-26 DIAGNOSIS — E785 Hyperlipidemia, unspecified: Secondary | ICD-10-CM

## 2024-02-26 DIAGNOSIS — I4510 Unspecified right bundle-branch block: Secondary | ICD-10-CM

## 2024-02-26 NOTE — Patient Instructions (Signed)
 Medication Instructions:  Continue same medications *If you need a refill on your cardiac medications before your next appointment, please call your pharmacy*  Lab Work: Cbc,cmet,lipid panel,A1c today  Testing/Procedures: None ordered  Follow-Up: At [facility], you and your health needs are our priority.  As part of our continuing mission to provide you with exceptional heart care, our providers are all part of one team.  This team includes your primary Cardiologist (physician) and Advanced Practice Providers or APPs (Physician Assistants and Nurse Practitioners) who all work together to provide you with the care you need, when you need it.  Your next appointment:  6 months    Call in April to schedule June appointment     Provider:  Dr.Jordan   We recommend signing up for the patient portal called MyChart.  Sign up information is provided on this After Visit Summary.  MyChart is used to connect with patients for Virtual Visits (Telemedicine).  Patients are able to view lab/test results, encounter notes, upcoming appointments, etc.  Non-urgent messages can be sent to your provider as well.   To learn more about what you can do with MyChart, go to [email or web address].

## 2024-02-26 NOTE — Addendum Note (Signed)
 Addended by: CHRISTIANNE CHANNING PARAS on: 02/26/2024 08:59 AM   Modules accepted: Orders

## 2024-02-27 ENCOUNTER — Ambulatory Visit: Payer: Self-pay | Admitting: Cardiology

## 2024-02-27 LAB — CBC WITH DIFFERENTIAL/PLATELET
Basophils Absolute: 0.1 x10E3/uL (ref 0.0–0.2)
Basos: 1 %
EOS (ABSOLUTE): 0.1 x10E3/uL (ref 0.0–0.4)
Eos: 2 %
Hematocrit: 42.5 % (ref 34.0–46.6)
Hemoglobin: 13.6 g/dL (ref 11.1–15.9)
Immature Grans (Abs): 0 x10E3/uL (ref 0.0–0.1)
Immature Granulocytes: 0 %
Lymphocytes Absolute: 2.6 x10E3/uL (ref 0.7–3.1)
Lymphs: 42 %
MCH: 27.4 pg (ref 26.6–33.0)
MCHC: 32 g/dL (ref 31.5–35.7)
MCV: 86 fL (ref 79–97)
Monocytes Absolute: 0.8 x10E3/uL (ref 0.1–0.9)
Monocytes: 14 %
Neutrophils Absolute: 2.5 x10E3/uL (ref 1.4–7.0)
Neutrophils: 41 %
Platelets: 249 x10E3/uL (ref 150–450)
RBC: 4.96 x10E6/uL (ref 3.77–5.28)
RDW: 13.7 % (ref 11.7–15.4)
WBC: 6.2 x10E3/uL (ref 3.4–10.8)

## 2024-02-27 LAB — COMPREHENSIVE METABOLIC PANEL WITH GFR
ALT: 26 IU/L (ref 0–32)
AST: 28 IU/L (ref 0–40)
Albumin: 4.4 g/dL (ref 3.7–4.7)
Alkaline Phosphatase: 129 IU/L (ref 48–129)
BUN/Creatinine Ratio: 19 (ref 12–28)
BUN: 17 mg/dL (ref 8–27)
Bilirubin Total: 0.6 mg/dL (ref 0.0–1.2)
CO2: 22 mmol/L (ref 20–29)
Calcium: 9.2 mg/dL (ref 8.7–10.3)
Chloride: 106 mmol/L (ref 96–106)
Creatinine, Ser: 0.88 mg/dL (ref 0.57–1.00)
Globulin, Total: 2.4 g/dL (ref 1.5–4.5)
Glucose: 94 mg/dL (ref 70–99)
Potassium: 4.3 mmol/L (ref 3.5–5.2)
Sodium: 143 mmol/L (ref 134–144)
Total Protein: 6.8 g/dL (ref 6.0–8.5)
eGFR: 65 mL/min/1.73 (ref >59)

## 2024-02-27 LAB — HEMOGLOBIN A1C
Est. average glucose Bld gHb Est-mCnc: 117 mg/dL
Hgb A1c MFr Bld: 5.7 % — ABNORMAL HIGH (ref 4.8–5.6)

## 2024-02-27 LAB — LIPID PANEL
Chol/HDL Ratio: 2.9 ratio (ref 0.0–4.4)
Cholesterol, Total: 170 mg/dL (ref 100–199)
HDL: 58 mg/dL (ref >39)
LDL Chol Calc (NIH): 94 mg/dL (ref 0–99)
Triglycerides: 96 mg/dL (ref 0–149)
VLDL Cholesterol Cal: 18 mg/dL (ref 5–40)

## 2024-03-18 ENCOUNTER — Other Ambulatory Visit: Payer: Self-pay | Admitting: Cardiology

## 2024-03-23 ENCOUNTER — Telehealth: Payer: Self-pay | Admitting: Cardiology

## 2024-03-23 NOTE — Telephone Encounter (Signed)
 Patient identification verified by 2 forms.   Called and spoke to patient  Patient states:  -2-3 weeks blood pressure cuff showed irregular heart rate -Blood pressure readings are transmitter to PCP.  -Was drinking Matcha regularly for awhile.  -No longer drinking Matcha at all.   Patient denies:  -Being hydrated  Interventions/Plan: -Will get message to Dr. Jordan for review.  -Request sent to send PCP for blood pressure readings   Reviewed ED warning signs/precautions  Patient agrees with plan, no questions at this time

## 2024-03-23 NOTE — Telephone Encounter (Signed)
 Patient c/o Palpitations: STAT if patient c/o lightheadedness, shortness of breath, or chest pain  How long have you had palpitations/irregular HR/ Afib? Are you having the symptoms now?  Irregular HR on and off, showing on monitor for the past 2 weeks. No symptoms currently.  Are you currently experiencing lightheadedness, SOB or CP?  No symptoms currently.  Do you have a history of afib (atrial fibrillation) or irregular heart rhythm?   Have you checked your BP or HR? (document readings if available):  BP normally falls within normal range around 120/78. Sometimes when she is anxious the systolic goes up to 150's, but not often. HR remains in the 60's even when it is irregular.  Are you experiencing any other symptoms?  Palpitations

## 2024-03-23 NOTE — Telephone Encounter (Signed)
 PT calling back to check status of concerns

## 2024-03-24 NOTE — Telephone Encounter (Signed)
Called patient Dr.Jordan's advice left on personal voice mail.

## 2024-04-01 ENCOUNTER — Ambulatory Visit: Admitting: *Deleted

## 2024-04-01 DIAGNOSIS — Z7901 Long term (current) use of anticoagulants: Secondary | ICD-10-CM

## 2024-04-01 DIAGNOSIS — Z5181 Encounter for therapeutic drug level monitoring: Secondary | ICD-10-CM

## 2024-04-01 LAB — POCT INR: POC INR: 2.4

## 2024-04-01 NOTE — Patient Instructions (Signed)
 Description   INR 2.4, Continue taking warfarin 5mg  daily EXCEPT 7.5mg  on Fridays.  Stay consistent with greens each week.  Repeat INR in 6 weeks. Coumadin  Clinic # [phone]

## 2024-04-01 NOTE — Progress Notes (Signed)
 Lab Results  Component Value Date   INR 2.4 04/01/2024   INR 2.7 02/19/2024   INR 2.3 01/08/2024    Description   INR 2.4, Continue taking warfarin 5mg  daily EXCEPT 7.5mg  on Fridays.  Stay consistent with greens each week.  Repeat INR in 6 weeks. Coumadin  Clinic # [phone]

## 2024-05-13 ENCOUNTER — Ambulatory Visit

## 2024-05-20 ENCOUNTER — Ambulatory Visit: Admitting: Psychiatry
